# Patient Record
Sex: Male | Born: 1950 | Race: White | Hispanic: No | Marital: Married | Smoking: Former smoker
Health system: Southern US, Community
[De-identification: ages and names within clinical notes are randomized; demographics above are authoritative.]

## PROBLEM LIST (undated history)

## (undated) DIAGNOSIS — I509 Heart failure, unspecified: Secondary | ICD-10-CM

## (undated) DIAGNOSIS — I219 Acute myocardial infarction, unspecified: Secondary | ICD-10-CM

## (undated) DIAGNOSIS — I10 Essential (primary) hypertension: Secondary | ICD-10-CM

## (undated) DIAGNOSIS — E871 Hypo-osmolality and hyponatremia: Secondary | ICD-10-CM

## (undated) DIAGNOSIS — R569 Unspecified convulsions: Secondary | ICD-10-CM

## (undated) DIAGNOSIS — I639 Cerebral infarction, unspecified: Secondary | ICD-10-CM

## (undated) DIAGNOSIS — I2510 Atherosclerotic heart disease of native coronary artery without angina pectoris: Secondary | ICD-10-CM

## (undated) DIAGNOSIS — D649 Anemia, unspecified: Secondary | ICD-10-CM

## (undated) DIAGNOSIS — E785 Hyperlipidemia, unspecified: Secondary | ICD-10-CM

## (undated) HISTORY — PX: LAMINECTOMY: SHX219

## (undated) HISTORY — PX: TONSILLECTOMY: SUR1361

---

## 2012-10-08 ENCOUNTER — Inpatient Hospital Stay: Payer: Self-pay | Admitting: Internal Medicine

## 2012-10-08 LAB — TROPONIN I: Troponin-I: 10 ng/mL — ABNORMAL HIGH

## 2012-10-08 LAB — CBC
HGB: 14.9 g/dL (ref 13.0–18.0)
MCH: 30 pg (ref 26.0–34.0)
MCHC: 34.5 g/dL (ref 32.0–36.0)
MCV: 87 fL (ref 80–100)
Platelet: 368 10*3/uL (ref 150–440)
RBC: 4.96 10*6/uL (ref 4.40–5.90)
WBC: 8.7 10*3/uL (ref 3.8–10.6)

## 2012-10-08 LAB — APTT: Activated PTT: 60.8 secs — ABNORMAL HIGH (ref 23.6–35.9)

## 2012-10-08 LAB — BASIC METABOLIC PANEL
BUN: 12 mg/dL (ref 7–18)
Calcium, Total: 8.8 mg/dL (ref 8.5–10.1)
Chloride: 102 mmol/L (ref 98–107)
Co2: 27 mmol/L (ref 21–32)
Creatinine: 0.79 mg/dL (ref 0.60–1.30)
EGFR (African American): >60
EGFR (Non-African Amer.): >60
Potassium: 3.6 mmol/L (ref 3.5–5.1)

## 2012-10-08 LAB — CK TOTAL AND CKMB (NOT AT ARMC)
CK, Total: 214 U/L (ref 35–232)
CK-MB: 4.8 ng/mL — ABNORMAL HIGH (ref 0.5–3.6)

## 2012-10-08 LAB — PROTIME-INR
INR: 1
Prothrombin Time: 13.1 secs (ref 11.5–14.7)

## 2012-10-09 LAB — APTT: Activated PTT: 75.1 secs — ABNORMAL HIGH (ref 23.6–35.9)

## 2012-10-09 LAB — CK TOTAL AND CKMB (NOT AT ARMC): CK, Total: 189 U/L (ref 35–232)

## 2012-10-09 LAB — TROPONIN I: Troponin-I: 7.5 ng/mL — ABNORMAL HIGH

## 2014-02-11 ENCOUNTER — Inpatient Hospital Stay: Payer: Self-pay | Admitting: Internal Medicine

## 2014-02-11 LAB — CBC
HCT: 44.7 % (ref 40.0–52.0)
HGB: 14.8 g/dL (ref 13.0–18.0)
MCH: 29.1 pg (ref 26.0–34.0)
MCHC: 33 g/dL (ref 32.0–36.0)
MCV: 88 fL (ref 80–100)
Platelet: 352 10*3/uL (ref 150–440)
RBC: 5.07 10*6/uL (ref 4.40–5.90)
RDW: 14 % (ref 11.5–14.5)
WBC: 20.3 10*3/uL — ABNORMAL HIGH (ref 3.8–10.6)

## 2014-02-11 LAB — HEPATIC FUNCTION PANEL A (ARMC)
Albumin: 3.6 g/dL (ref 3.4–5.0)
Alkaline Phosphatase: 79 U/L
BILIRUBIN TOTAL: 0.4 mg/dL (ref 0.2–1.0)
Bilirubin, Direct: <0.1 mg/dL (ref 0.00–0.20)
SGOT(AST): 364 U/L — ABNORMAL HIGH (ref 15–37)
SGPT (ALT): 54 U/L (ref 12–78)
TOTAL PROTEIN: 7.9 g/dL (ref 6.4–8.2)

## 2014-02-11 LAB — BASIC METABOLIC PANEL
Anion Gap: 6 — ABNORMAL LOW (ref 7–16)
BUN: 13 mg/dL (ref 7–18)
CALCIUM: 9.1 mg/dL (ref 8.5–10.1)
Chloride: 102 mmol/L (ref 98–107)
Co2: 28 mmol/L (ref 21–32)
Creatinine: 0.82 mg/dL (ref 0.60–1.30)
EGFR (African American): >60
EGFR (Non-African Amer.): >60
Glucose: 109 mg/dL — ABNORMAL HIGH (ref 65–99)
Osmolality: 273 (ref 275–301)
Potassium: 3.6 mmol/L (ref 3.5–5.1)
SODIUM: 136 mmol/L (ref 136–145)

## 2014-02-11 LAB — CK TOTAL AND CKMB (NOT AT ARMC)
CK, TOTAL: 2421 U/L — AB
CK, Total: 2574 U/L — ABNORMAL HIGH
CK-MB: 317.2 ng/mL — ABNORMAL HIGH (ref 0.5–3.6)
CK-MB: 243.8 ng/mL — ABNORMAL HIGH (ref 0.5–3.6)

## 2014-02-11 LAB — TROPONIN I: Troponin-I: >40 ng/mL

## 2014-02-11 LAB — TSH: Thyroid Stimulating Horm: 1.22 u[IU]/mL

## 2014-02-11 LAB — CK: CK, Total: 2687 U/L — ABNORMAL HIGH

## 2014-02-11 LAB — APTT: ACTIVATED PTT: 55.3 s — AB (ref 23.6–35.9)

## 2014-02-11 LAB — LIPASE, BLOOD: Lipase: 72 U/L — ABNORMAL LOW (ref 73–393)

## 2014-02-11 LAB — HEMOGLOBIN A1C: HEMOGLOBIN A1C: 5.8 % (ref 4.2–6.3)

## 2014-02-12 LAB — COMPREHENSIVE METABOLIC PANEL
Albumin: 3 g/dL — ABNORMAL LOW (ref 3.4–5.0)
Alkaline Phosphatase: 65 U/L
Anion Gap: 7 (ref 7–16)
BUN: 11 mg/dL (ref 7–18)
Bilirubin,Total: 0.5 mg/dL (ref 0.2–1.0)
CO2: 26 mmol/L (ref 21–32)
CREATININE: 0.73 mg/dL (ref 0.60–1.30)
Calcium, Total: 8.4 mg/dL — ABNORMAL LOW (ref 8.5–10.1)
Chloride: 102 mmol/L (ref 98–107)
GLUCOSE: 128 mg/dL — AB (ref 65–99)
Osmolality: 271 (ref 275–301)
POTASSIUM: 3.5 mmol/L (ref 3.5–5.1)
SGOT(AST): 336 U/L — ABNORMAL HIGH (ref 15–37)
SGPT (ALT): 57 U/L (ref 12–78)
Sodium: 135 mmol/L — ABNORMAL LOW (ref 136–145)
Total Protein: 6.4 g/dL (ref 6.4–8.2)

## 2014-02-12 LAB — CBC WITH DIFFERENTIAL/PLATELET
Basophil #: 0.1 10*3/uL (ref 0.0–0.1)
Basophil %: 0.5 %
EOS PCT: 0.2 %
Eosinophil #: 0 10*3/uL (ref 0.0–0.7)
HCT: 38.7 % — AB (ref 40.0–52.0)
HGB: 13.1 g/dL (ref 13.0–18.0)
Lymphocyte #: 1.9 10*3/uL (ref 1.0–3.6)
Lymphocyte %: 11.6 %
MCH: 30 pg (ref 26.0–34.0)
MCHC: 33.7 g/dL (ref 32.0–36.0)
MCV: 89 fL (ref 80–100)
MONO ABS: 1.7 x10 3/mm — AB (ref 0.2–1.0)
Monocyte %: 10.2 %
NEUTROS ABS: 13 10*3/uL — AB (ref 1.4–6.5)
Neutrophil %: 77.5 %
PLATELETS: 291 10*3/uL (ref 150–440)
RBC: 4.35 10*6/uL — ABNORMAL LOW (ref 4.40–5.90)
RDW: 14.4 % (ref 11.5–14.5)
WBC: 16.7 10*3/uL — AB (ref 3.8–10.6)

## 2014-02-12 LAB — LIPID PANEL
CHOLESTEROL: 134 mg/dL (ref 0–200)
HDL Cholesterol: 36 mg/dL — ABNORMAL LOW (ref 40–60)
LDL CHOLESTEROL, CALC: 74 mg/dL (ref 0–100)
Triglycerides: 122 mg/dL (ref 0–200)
VLDL Cholesterol, Calc: 24 mg/dL (ref 5–40)

## 2014-02-12 LAB — APTT
ACTIVATED PTT: 64.9 s — AB (ref 23.6–35.9)
Activated PTT: 80.7 secs — ABNORMAL HIGH (ref 23.6–35.9)

## 2014-02-13 LAB — APTT: ACTIVATED PTT: 72.8 s — AB (ref 23.6–35.9)

## 2015-01-09 NOTE — Consult Note (Signed)
PATIENT NAME:  Corey Meyers, Corey Meyers MR#:  [id number] DATE OF BIRTH:  1950-10-21  DATE OF CONSULTATION:  10/08/2012  REFERRING PHYSICIAN:   Vivek J. Cherlynn KaiserSainani, MD  CONSULTING PHYSICIAN:  Marcina MillardAlexander Alicyn Klann, MD  CHIEF COMPLAINT:  Chest pain.   REASON FOR CONSULTATION: Consultation requested for evaluation of chest pain and elevated troponin.   HISTORY OF PRESENT ILLNESS: The patient is a 64 year old gentleman with history of hypertension. He was in his usual state of health until 10/06/2012. The patient is a Naval architecttruck driver and [place] he started to experience substernal burning, nausea and pain running down both arms. The patient pulled his truck over and tried to get comfortable in the chart. His symptoms lasted for approximately two hours before he fell asleep.  Today, the wife urged him to seek medical attention. The patient called Dr. Webb SilversmithHedrick's office, who suggested he go to [facility] Room. EKG was non-diagnostic. Initial troponin was 10.0. The patient currently is chest pain free.   PAST MEDICAL HISTORY:  1.  Hypertension.  2.  Tobacco abuse.   MEDICATIONS:  1.  Aspirin 81 mg daily.  2.  Lisinopril/hydrochlorothiazide 20/12.5 mg daily.   SOCIAL HISTORY: The patient is married. He smokes a pack of cigarettes a day. He works as a Naval architecttruck driver.   FAMILY HISTORY: No immediate family history for coronary disease or myocardial infarction.   REVIEW OF SYSTEMS: CONSTITUTIONAL: No fever or chills.  EYES: No blurry vision.  EARS: No hearing loss.  RESPIRATORY: No shortness of breath.  CARDIOVASCULAR: Chest discomfort, as described above.  GASTROINTESTINAL: The patient had nausea during chest discomfort.  GU: No dysuria or hematuria.  ENDOCRINE: No polyuria or polydipsia.  MUSCULOSKELETAL: No arthralgias or myalgias.  NEUROLOGICAL: No focal muscle weakness or numbness.  PSYCHOLOGICAL: No depression or anxiety.   PHYSICAL EXAMINATION:  VITAL SIGNS: Blood pressure 117/78,  pulse 71, respirations 20, temperature 98.3, pulse oximetry 96%.  HEENT: Pupils equal and reactive to light and accommodation.  NECK: Supple without thyromegaly.  LUNGS: Clear.  HEART: Normal JVP.  Normal PMI. Regular rate and rhythm. Normal S1, S2. No appreciable gallop, murmur, or rub.  ABDOMEN: Soft and nontender. Pulses were intact bilaterally.  MUSCULOSKELETAL: Normal muscle tone.  NEUROLOGIC: The patient is alert and oriented x 3. Motor and sensory both grossly intact.   IMPRESSION: 64 year old gentleman with multiple cardiovascular risk factors with recent episode of prolonged chest pain, with elevated troponin, consistent with non-ST elevation myocardial infarction.                RECOMMENDATIONS:  1.  Continue present medications.  2.  Agree with heparin drip.  3.  Proceed with cardiac catheterization with selective coronary arteriography on 10/09/2012.   The risks, benefits, and alternatives were explained and informed written consent obtained  ____________________________ Marcina MillardAlexander Deontrey Massi, MD ap:eg Meyers: 10/08/2012 20:35:36 ET T: 10/08/2012 22:18:09 ET JOB#: [id number]  cc: Marcina MillardAlexander Jayd Forrey, MD, <Dictator> Vivek J. Cherlynn KaiserSainani, MD  Marcina MillardALEXANDER Ellinor Test MD ELECTRONICALLY SIGNED 11/13/2012 14:56

## 2015-01-09 NOTE — Discharge Summary (Signed)
PATIENT NAME:  Corey Meyers, Corey Meyers MR#:  [id number] DATE OF BIRTH:  16-Nov-1950  DATE OF ADMISSION:  10/08/2012 DATE OF DISCHARGE:    CONSULTANT: Dr. Darrold JunkerParaschos.   PROCEDURES: Cardiac cath, final report pending, but the patient does have 90% stenosis of LAD and recommended [facility] for [facility] PCI.   IMAGING STUDIES: Include a chest x-ray which showed cardiomegaly.    ADMITTING HISTORY AND PHYSICAL: Please see detailed H and P dictated by Dr. Cherlynn KaiserSainani. In brief, a 64 year old male patient with hypertension and tobacco abuse, who presented to the hospital with burning in his chest with diaphoresis. The patient was found to have elevated troponin of 10, diagnosed with non-Q MI and admitted to the hospitalist service with cardiology consult.   HOSPITAL COURSE: 1.  Non-Q MI.  The patient had a cardiac cath which showed a complex proximal LAD stenosis and patient presently is chest pain-free, continued on the heparin drip and is being transferred to [facility] for complex PCI.  2.  Patient has been started on a beta blocker. His hydrochlorothiazide has been stopped. His lisinopril from home has been continued. The patient was counseled to quit smoking for greater than 3 minutes.  3.  Presently, the patient's pulse is 69, temperature 97.9, blood pressure 115/79 and saturating 96% on room air.   REASON FOR TRANSFER: Complex proximal LAD stenosis and transfer initiated by Dr. Darrold JunkerParaschos of cardiology. [facility].   PRESENT INPATIENT MEDICATIONS INCLUDE:  1.  Lisinopril 20 mg oral once a day.  2.  Aspirin 325 mg oral daily.  3.  Metoprolol tartrate 25 mg oral 2 times a day.  4.  Acetaminophen 650 mg oral q.4 p.r.n.  5.  Heparin drip.  6.  Pravachol 40 mg oral daily.   TIME SPENT: Time spent on discharge activity was 40 minutes.    ____________________________ Molinda BailiffSrikar R. Christophor Eick, MD srs:cs Meyers: 10/09/2012 15:05:58 ET T: 10/09/2012 15:17:17 ET JOB#: [id number]  cc: Wardell HeathSrikar  R. Geniyah Eischeid, MD, <Dictator> Marcina MillardAlexander Paraschos, MD [facility] Orie FishermanSRIKAR R Wilian Kwong MD ELECTRONICALLY SIGNED 10/25/2012 13:23

## 2015-01-09 NOTE — H&P (Signed)
PATIENT NAME:  Corey Meyers, Corey Meyers MR#:  [id number] DATE OF BIRTH:  1951-04-14  DATE OF ADMISSION:  10/08/2012  PRIMARY CARE PHYSICIAN: Dr. Burnett Sheng.   CHIEF COMPLAINT: Burning in his chest and nausea, vomiting.   HISTORY OF PRESENT ILLNESS: This is a 64 year old male who presents to the hospital brought in by his wife, as he had an episode of burning in his chest with some diaphoresis, nausea, vomiting that happened on Saturday. 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 Shortly after the headache, he developed bilateral shoulder blade pain and he attempted to pull over to the side of the road, which he did. Shortly thereafter, he broke out in a cold sweat and became quite nauseous and had some dry heaving and vomiting. He then developed this burning sensation in his chest that lasted for about an hour or 2. He tried lying in certain positions to see if it would alleviate his symptoms, but it did not and therefore eventually he fell off to sleep and woke up the next morning and has had no symptoms since then. He told his wife about what happened and the wife's father had similar symptoms like this and was noted to have a heart attack and therefore she was a bit worried and brought him to the ER. In the Emergency Room, the patient was noted to have an elevated troponin at 10 and hospitalist services were contacted for further treatment and evaluation.   REVIEW OF SYSTEMS: CONSTITUTIONAL: No documented fever. No weight gain or weight loss.  EYES: No blurred or double vision.  ENT: No tinnitus. No postnasal drip. No redness of oropharynx.  RESPIRATORY: No cough, no wheeze, no hemoptysis.  CARDIOVASCULAR: Positive chest pain. No orthopnea, no palpitations, no syncope.  GASTROINTESTINAL: Positive nausea. Positive vomiting. No diarrhea. No abdominal pain, no melena, hematochezia.  GENITOURINARY: No dysuria or hematuria.   ENDOCRINE: No polyuria or nocturia. No heat or cold intolerance.  HEMATOLOGIC: No anemia, no bruising, no bleeding.  INTEGUMENTARY: No rashes. No lesions.  MUSCULOSKELETAL: No arthritis, no swelling, and no gout.   NEUROLOGIC: No numbness, no tingling, no ataxia, no seizure-type activity.  PSYCHIATRIC: No anxiety, no insomnia, no ADD.   PAST MEDICAL HISTORY: Is consistent with hypertension, tobacco abuse.   ALLERGIES: No known drug allergies.   SOCIAL HISTORY: Does smoke about a pack per day, has been smoking for the past 20 to 30 years. No alcohol abuse. No illicit drug abuse. Lives at home with his wife.   FAMILY HISTORY: Both mother and father are deceased. Mother died from breast cancer. Father died from complications of renal failure.   CURRENT MEDICATIONS: Aspirin 81 mg daily and hydrochlorothiazide/lisinopril 12.5/20, 1 tab daily.   PHYSICAL EXAMINATION:  On admission is as follows:   VITAL SIGNS: Temperature 98, pulse 78, respirations 20, blood pressure 110/68, sats 99% on room air.  GENERAL: He is a pleasant appearing male in no apparent distress.  HEENT: He is atraumatic, normocephalic. Extraocular muscles are intact. Pupils equal, reactive to light. Sclerae anicteric. No conjunctival injection. No pharyngeal erythema.  NECK: Supple. No jugular venous distention. No bruits, no lymphadenopathy, no thyromegaly.  HEART: Regular rate and rhythm. No murmurs, no rubs, no clicks.  LUNGS: Clear to auscultation bilaterally. No rales, no rhonchi, no wheezes.  ABDOMEN: Soft, flat, nontender, nondistended. He has good bowel sounds. No hepatosplenomegaly appreciated.  EXTREMITIES: There is no evidence of any cyanosis,  clubbing, or peripheral edema. He has +2 pedal and radial pulses bilaterally.  NEUROLOGICAL: The patient is alert, awake, and oriented x 3 with no focal motor or sensory deficits bilaterally.  SKIN: Moist and warm with no rash appreciated.  LYMPHATIC: There is no cervical or  axillary lymphadenopathy.   LABORATORY DATA:  Serum glucose 85, BUN 12, creatinine 0.7, sodium 137, potassium 3.6, chloride 102, bicarbonate 27. Total CK 248, CK-MB 9.3. Troponin 10. White cell count 8.7, hemoglobin 14.9, hematocrit 43.3, platelet count 368. INR is 1.0.   The patient did have an EKG done, which shows normal sinus rhythm with normal axes with T wave inversion is noted in lead I, AVL and V2.   ASSESSMENT AND PLAN: This is a 64 year old male with past medical history of hypertension and tobacco abuse, who presents to the hospital due to an episode of chest burning with diaphoresis, nausea, vomiting on Saturday, and now noted to have an elevated troponin of 10.   PROBLEMS: 1.  Non- Q wave MI. This was likely the cause of the patient's symptoms on Saturday. The patient currently is asymptomatic. He likely suffered his MI this past Saturday when he had these acute symptoms. His EKG does show T wave inversions in the lateral lead. For now, I will continue him on a heparin drip, give him aspirin, sublingual nitroglycerin as needed for chest pain, but he is chest pain-free. Continue some IV morphine for chest pain. Continue some beta blocker and statin. Also will get a cardiology consult, as he likely needs a cardiac catheterization. I discussed the case with Dr. Darrold JunkerParaschos, who will see the patient.  2.  Hypertension. We will continue his metoprolol, also continue hydrochlorothiazide and lisinopril, follow hemodynamics. 3.  Tobacco abuse. I counseled the patient on quitting smoking anywhere between 1 to 3 minutes. I offered nicotine patch, but he refused.   CODE STATUS: The patient is a full code.   TIME SPENT: 50 minutes    ____________________________ Rolly PancakeVivek J. Cherlynn KaiserSainani, MD vjs:cc D: 10/08/2012 17:54:57 ET T: 10/08/2012 19:02:43 ET JOB#: [id number]  cc: Rolly PancakeVivek J. Cherlynn KaiserSainani, MD, <Dictator> Houston SirenVIVEK J SAINANI MD ELECTRONICALLY SIGNED 10/09/2012 16:27

## 2015-01-10 NOTE — Consult Note (Signed)
PATIENT NAME:  Corey Meyers, Corey Meyers MR#:  [id number] DATE OF BIRTH:  11/24/1950  DATE OF CONSULTATION:  02/11/2014  REFERRING PHYSICIAN:  Dr. Winona Legato CONSULTING PHYSICIAN:  Kannon Baum D. Laron Boorman, MD  INDICATION:  Unstable angina, non-Q-wave myocardial infarction.   CARDIOLOGIST:  Dr. Darrold Junker.   HISTORY OF PRESENT ILLNESS:  The patient is a 65 year old white male with a history of non-Q anterior wall myocardial infarction in January of 2014.  The patient also had hypertension, hyperlipidemia, tobacco abuse, came to the hospital complaints of chest pain over the last 24 to 48 hours.  The patient has been noncompliant with his medications according to the wife.  He quit taking Plavix, quit taking Lipitor.  He should be on blood pressure medicines lisinopril, HCTZ.  At around 2:30 yesterday the patient was washing the car in the driveway and started having a burning sensation in the chest, 10 out of 10 in intensity, nausea and vomiting, not much shortness of breath or lightheadedness.  Pain was recurrent.  Took Alka-Seltzer with no relief.  Eventually he finally came to the Emergency Room for evaluation.  Initial EKG was concerning for a NSTEMI, but did not meet criteria.  He was placed on anticoagulation and subsequently had troponins that were elevated.   REVIEW OF SYSTEMS:   No blackout spells, no syncope.  He has had mild nausea, vomiting.  No fever.  No chills.  No sweats.  No weight loss.  No weight gain, no hemoptysis, no hematemesis.  No bright red blood per rectum.  No vision change or hearing change.  Denies sputum production or cough.   PAST MEDICAL HISTORY:  Hyperlipidemia, hypertension, tobacco abuse, non-Q-wave myocardial infarction, obesity, coronary artery disease.   PAST SURGICAL HISTORY:  PCI and stent to the proximal LAD done at [facility].  Repeat catheterization, patent stents.   ALLERGIES:  None.   SOCIAL HISTORY:  He smokes.  Works as a Naval architect.  No alcohol consumption.  Lives with his  wife.   FAMILY HISTORY:  Breast cancer, renal insufficiency, coronary artery disease.   MEDICATIONS:  Aspirin 81 mg daily, HCTZ, lisinopril 12.5/20 once a day, he should be on Lipitor 80 a day, Plavix 75, but is not sure he is taking any of these medications for at least two weeks.   PHYSICAL EXAMINATION: VITAL SIGNS:  Blood pressure 110/70, pulse of 75, respiratory rate of 16, afebrile.  HEENT:  Normocephalic, atraumatic.  Pupils equal, reactive to light.  NECK:  Supple.  No significant JVD, bruits or adenopathy.  LUNGS:  Clear to auscultation and percussion.  No significant wheeze, rhonchi, or rale.  HEART:  Regular rate and rhythm.  Systolic ejection murmur left sternal border.  Positive S4.  PMI nondisplaced.  ABDOMEN:  Benign.  EXTREMITIES:  Within normal limits.  NEUROLOGIC:  Intact.  SKIN:  Normal.   EKG:  Normal sinus rhythm, Q waves, anteroseptally, nonspecific ST-T changes otherwise.  Glucose 109.  BNP was negative.  Lipase 72, hemoglobin A1c 5.8.  Liver enzymes were elevated at 364, AST was 364.  Troponin was 40.  White count of 20, hemoglobin of 14.8, platelet count was normal.  PTT 23.  Chest x-ray unremarkable.   ASSESSMENT: 1.  Acute non-Q-wave myocardial infarction, abnormal EKG.  2.  Hypertension.  3.  Coronary artery disease.  4.  Hyperlipidemia.  5.  Hyperglycemia.  6.  Obesity.   PLAN:   1.  Agree with admit.  Rule out for myocardial infarction.  Continue current therapy, heparin,  we will add IIb 3 inhibitor, continue current medical therapy, aspirin therapy.  Resume Plavix.  Resume anticoagulation.  We will consider echocardiogram.  2.  For hyperlipidemia, resume Lipitor therapy.  Follow up lipid and liver studies.  3.  For obesity, recommend weight loss, exercise, portion control.  4.  For coronary artery disease, aspirin and Plavix, beta blocker, ACE inhibitor therapy.  5.  Elevated white count, may be an acute phase reactant.  6.  Hyperglycemia, follow up  hemoglobin A1c, which was 5.8 which is good.  We will probably proceed with cardiac catheterization in the morning.  Place on to IIb 3 inhibitor as well as heparin in the interim.  Proceed with catheterization in the morning.    ____________________________ Bobbie Stackwayne D. Juliann Paresallwood, MD ddc:ea D: 02/11/2014 16:46:36 ET T: 02/11/2014 17:17:24 ET JOB#: [id number]  cc: Gennette Shadix D. Juliann Paresallwood, MD, <Dictator> Alwyn PeaWAYNE D Axzel Rockhill MD ELECTRONICALLY SIGNED 03/26/2014 16:42

## 2015-01-10 NOTE — Discharge Summary (Signed)
PATIENT NAME:  Corey Meyers, Corey D MR#:  [id number] DATE OF BIRTH:  [birth year]  DATE OF ADMISSION:  02/11/2014 DATE OF DISCHARGE:  02/12/2014   DISCHARGE DIAGNOSES: 1.  Acute myocardial infarction. History of coronary artery disease and stent placement in the past. Noncompliance to medication. Has clotted previous stent.  2.  Hypertension.  3.  Hyperlipidemia.  4.  Hyperglycemia.  5.  Smoking.  CONDITION ON DISCHARGE: Stable.   CODE STATUS: Full code.   DISCHARGE MEDICATIONS: 1.  Atorvastatin 80 mg oral tablet once a day.  2.  Morphine 2 mg injection every 4 hours as needed.  3.  Aspirin 325 mg oral once a day.  4.  Clopidogrel 75 mg oral once a day.  5.  Metoprolol 25 mg oral 2 times a day.  6.  Pantoprazole 40 mg delayed-release tablet once a day.  7.  Nicotine 1 patch transdermal once a day.   DIET ON DISCHARGE: Low-sodium, low-fat, low-cholesterol, regular consistency diet.   DISPOSITION: Advised to be discharged [facility] for further management of his coronary artery disease. Transfer with IV heparin drip running.   HISTORY OF PRESENT ILLNESS: This is a 64 year old male with history significant for non-Q-wave myocardial infarction in January 2014 with hypertension, hyperlipidemia, and tobacco abuse. Had stent placement for that MI, and he was on Plavix, aspirin, hydrochlorothiazide and lisinopril because of insurance issues a few weeks ago, the patient stopped taking his medications. Then, on the previous day when he was working in his driveway started having epigastric pain which was radiating to right side of abdomen, and some discomfort was also going to his lower sternum. Pain was 10 out of 10, intense. He was also feeling some lightheadedness, some nausea, severe headache, but no palpitations. Pain radiated to his right shoulder and right neck and back. Finally he decided to come to Emergency Room, as aspirin did not relieve his pain. In ER, his troponin was found to be 40, and he had ST  elevation in anterior leads so hospitalist service was contacted for further management.   HOSPITAL COURSE AND STAY:  1.  He was started on IV heparin drip and cardiology consult was called in. Cardiologist, Dr. Juliann Paresallwood, saw him in Emergency Room and admitted to Critical Care Unit, where he was managed with heparin drip and tirofiban IV drip. The next day, which is on 27th of May, he was taken for cardiac catheterization and was found with LAD 100% in-stent 100% block, so because of this complicated lesion, Dr. Juliann Paresallwood suggested to [facility], and the patient is being managed and monitored in Critical Care Unit with IV heparin drip after the procedure. We gave him aspirin and Plavix in the hospital and gave him metoprolol and atorvastatin.  2.  For hypertension, gave metoprolol.  3.  Hyperlipidemia: Gave atorvastatin.  4.  Smoking: We gave nicotine patch and smoking cessation counseling was done.  IMPORTANT LABORATORY AND RADIOLOGICAL RESULTS: Creatinine 0.82. LDL 74, cholesterol 134, triglyceride 122, and HDL 36. Troponin was more than 40 all 3 times we followed. TSH 1.22. WBC 20,000, hemoglobin 14.8, platelet count 352. Chest x-ray, portable single view: No active cardiopulmonary disease.   TOTAL TIME SPENT ON THIS DISCHARGE: 40 minutes.   ____________________________ Hope PigeonVaibhavkumar G. Elisabeth PigeonVachhani, MD vgv:jcm D: 02/12/2014 16:25:42 ET T: 02/12/2014 17:22:51 ET JOB#: [id number]  cc: Hope PigeonVaibhavkumar G. Elisabeth PigeonVachhani, MD, <Dictator> Dwayne D. Juliann Paresallwood, MD Altamese DillingVAIBHAVKUMAR Scotty Pinder MD ELECTRONICALLY SIGNED 03/05/2014 9:00

## 2015-01-10 NOTE — Consult Note (Signed)
Chief Complaint:  Subjective/Chief Complaint Pt states to be doing well. No cp today no sob Groin ok   VITAL SIGNS/ANCILLARY NOTES: **Vital Signs.:   28-May-15 13:00  Vital Signs Type Routine  Pulse Pulse 80  Pulse source if not from Vital Sign Device per cardiac monitor  Respirations Respirations 22  Systolic BP Systolic BP 938  Diastolic BP (mmHg) Diastolic BP (mmHg) 60  Mean BP 73  BP Source  if not from Vital Sign Device non-invasive  Pulse Ox % Pulse Ox % 95  Pulse Ox Activity Level  At rest  Oxygen Delivery 4L  Pulse Ox Heart Rate 80  *Intake and Output.:   Shift 28-May-15 15:00  Grand Totals Intake:  1693.6 Output:  1975    Net:  -281.4 24 Hr.:  -281.4  Oral Intake      In:  600  Heparin      In:  293.6  IV (Primary)      In:  800  Urine ml     Out:  1975  Length of Stay Totals Intake:  8334.3 Output:  4360    Net:  3974.3   Brief Assessment:  GEN well developed, well nourished, no acute distress   Cardiac Regular  murmur present  -- LE edema  --Rub   Respiratory normal resp effort   Gastrointestinal Normal   Gastrointestinal details normal Soft   Lab Results: Hepatic:  27-May-15 02:10   Bilirubin, Total 0.5  Alkaline Phosphatase 65 (45-117 NOTE: New Reference Range 08/09/13)  SGPT (ALT) 57  SGOT (AST)  336  Total Protein, Serum 6.4  Albumin, Serum  3.0  Routine Chem:  27-May-15 02:10   Cholesterol, Serum 134  Triglycerides, Serum 122  HDL (INHOUSE)  36  VLDL Cholesterol Calculated 24  LDL Cholesterol Calculated 74 (Result(s) reported on 12 Feb 2014 at 03:18AM.)  Glucose, Serum  128  BUN 11  Creatinine (comp) 0.73  Sodium, Serum  135  Potassium, Serum 3.5  Chloride, Serum 102  CO2, Serum 26  Calcium (Total), Serum  8.4  Osmolality (calc) 271  eGFR (African American) >60  eGFR (Non-African American) >60 (eGFR values <29m/min/1.73 m2 may be an indication of chronic kidney disease (CKD). Calculated eGFR is useful in patients with stable  renal function. The eGFR calculation will not be reliable in acutely ill patients when serum creatinine is changing rapidly. It is not useful in  patients on dialysis. The eGFR calculation may not be applicable to patients at the low and high extremes of body sizes, pregnant women, and vegetarians.)  Anion Gap 7  Routine Coag:  27-May-15 02:10   Activated PTT (APTT)  64.9 (A HCT value >55% may artifactually increase the APTT. In one study, the increase was an average of 19%. Reference: "Effect on Routine and Special Coagulation Testing Values of Citrate Anticoagulant Adjustment in Patients with High HCT Values." American Journal of Clinical Pathology 2006;126:400-405.)    08:40   Activated PTT (APTT)  80.7 (A HCT value >55% may artifactually increase the APTT. In one study, the increase was an average of 19%. Reference: "Effect on Routine and Special Coagulation Testing Values of Citrate Anticoagulant Adjustment in Patients with High HCT Values." American Journal of Clinical Pathology 21829;937:169-678)  28-May-15 06:29   Activated PTT (APTT)  72.8 (A HCT value >55% may artifactually increase the APTT. In one study, the increase was an average of 19%. Reference: "Effect on Routine and Special Coagulation Testing Values of Citrate Anticoagulant Adjustment in Patients with  High HCT Values." American Journal of Clinical Pathology 1027;253:664-403.)  Routine Hem:  27-May-15 02:10   WBC (CBC)  16.7  RBC (CBC)  4.35  Hemoglobin (CBC) 13.1  Hematocrit (CBC)  38.7  Platelet Count (CBC) 291  MCV 89  MCH 30.0  MCHC 33.7  RDW 14.4  Neutrophil % 77.5  Lymphocyte % 11.6  Monocyte % 10.2  Eosinophil % 0.2  Basophil % 0.5  Neutrophil #  13.0  Lymphocyte # 1.9  Monocyte #  1.7  Eosinophil # 0.0  Basophil # 0.1 (Result(s) reported on 12 Feb 2014 at 02:48AM.)   Radiology Results: XRay:    26-May-15 11:28, Chest Portable Single View  Chest Portable Single View   REASON FOR EXAM:     chest pain  COMMENTS:       PROCEDURE: DXR - DXR PORTABLE CHEST SINGLE VIEW  - Feb 11 2014 11:28AM     CLINICAL DATA:  Chest pain that    EXAM:  PORTABLE CHEST - 1 VIEW    COMPARISON:  Prior chest x-ray 10/08/2012    FINDINGS:  Thelungs are clear and negative for focal airspace consolidation,  pulmonary edema or suspicious pulmonary nodule. No pleural effusion  or pneumothorax. Cardiac and mediastinal contours are likely within  normal limits given portable frontal technique. No acute fracture or  lytic or blastic osseous lesions. The visualized upper abdominal  bowel gas pattern is unremarkable.     IMPRESSION:  No active cardiopulmonary disease.      Electronically Signed    By: Jacqulynn Cadet M.D.    On: 02/11/2014 11:44         Verified By: Criselda Peaches, M.D.,  Cardiology:    26-May-15 10:50, ED ECG  Ventricular Rate 76  Atrial Rate 76  P-R Interval 130  QRS Duration 78  QT 400  QTc 450  P Axis -9  R Axis 93  T Axis 31  ECG interpretation   Normal sinus rhythm with sinus arrhythmia  Rightward axis  Low voltage QRS  Anteroseptal infarct (cited on or before 08-Oct-2012)  Statement Not Found (#821)  Abnormal ECG  When compared with ECG of 08-Oct-2012 14:41,  Questionable change in initial forces of Anteroseptal leads  ST no longer elevated in Inferior leads  T wave inversion no longer evident in Lateral leads  ----------unconfirmed----------  Confirmed by OVERREAD, NOT ([id number]), editor PEARSON, BARBARA ([id number]) on 02/12/2014 1:33:11 PM  ED ECG    Assessment/Plan:  Assessment/Plan:  Assessment IMP S/P MI Ant  Acute stent thrombusis HTN CAD Hyperlipidemia Obesity No compliance .   Plan PLAN IV hydration ACE therapy Low dose B-blockers ASA Heparin for anticoug Rec viabilitity study Rec transfer to [facility] for complex PCI   Electronic Signatures: Yolonda Kida (MD)  (Signed 28-May-15 17:19)  Authored: Chief Complaint, VITAL  SIGNS/ANCILLARY NOTES, Brief Assessment, Lab Results, Radiology Results, Assessment/Plan   Last Updated: 28-May-15 17:19 by Yolonda Kida (MD)

## 2015-01-10 NOTE — H&P (Signed)
PATIENT NAME:  Corey Meyers, Corey Meyers MR#:  [id number] DATE OF BIRTH:  10-21-1950  DATE OF ADMISSION:  02/11/2014  PRIMARY CARE PHYSICIAN: Rhona Leavens. 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 According to the patient, he was doing well, and he has been taking Plavix as well as Lipitor over the past one year since January 2014. However, over the past few weeks, he completely stopped all medications including aspirin, HCTZ/lisinopril, statin (which is Lipitor), as well as Plavix due to insurance issues. Yesterday at around 2:30 p.m., he was washing in the driveway and he started having epigastric to right side abdominal discomfort, which was going on over the top of his lower sternum. Pain was described as burning sensation, constant. It was severe, 10/10 by intensity, accompanied by nausea and vomiting but no significant shortness of breath, lightheadedness or dizziness, or diaphoresis. It was accompanied also by severe headache but no palpitations. The patient's pain radiated to his right shoulder, as well as right neck and back. The patient tried numerous medications including aspirin. Also he took some Alka-Seltzer a few times over the past 10 hours. However, his pain did not abate and he decided to come to Emergency Room for further evaluation. In the Emergency Room, his EKG revealed ST elevation in anterior leads, and troponin was as high as 40. Hospitalist services were contacted for admission.   PAST MEDICAL HISTORY: Significant for history of hyperlipidemia, hypertension, tobacco abuse ongoing, history of non-Q-wave MI, status post catheterization which showed a proximal LAD lesion (approximately 90%), after which the patient was transferred to [facility]  where he had 2 stents placed in proximal LAD (area close to bifurcation). He has been taking Plavix, and he completed one year of Plavix therapy. He recently was on aspirin 81 mg 2 tablets once daily, HCTZ/lisinopril 12.5/20 mg once daily, and statin. He was supposed to be on Lipitor 80 mg daily as well as Plavix. However, he is not taking Lipitor or Plavix, and over the past 2 weeks, he actually stopped all medications because of insurance issues.   PAST SURGICAL HISTORY: The patient did have cardiac catheterization in January 2014 a few times. The first time he had it at [facility] where a proximal LAD lesion of 90% was diagnosed by Dr. Darrold Junker. The patient was noted to be [facility] where he had repeat cardiac catheterization and 2 stents placed in LAD. No other surgeries.  ALLERGIES: No known drug allergies.   SOCIAL HISTORY: Smokes. Used to smoke 1 pack a day, has been smoking for the past 20 or 30 years. The patient now transitioned to small cigars, and he smokes approximately 20 cigars in one week. He tries to quit. No alcohol abuse. No illicit drugs. Lives at home with his wife.   FAMILY HISTORY: The patient's both mother and father are deceased. Mother died of breast carcinoma. The patient's father died of complications of renal failure.   MEDICATIONS: Apparently the patient was supposed to be on aspirin 81 mg 2 tablets once daily and HCTZ/lisinopril 12.5/20 once daily. He was supposed to be on statin 80 mg p.o. daily (Lipitor) and Plavix 75 mg p.o. daily. He has not taking those medications over the past 2 weeks.  REVIEW OF SYSTEMS:  CONSTITUTIONAL: Positive for chest pains. However, denies any fevers, chills, fatigue, weakness, pains except as mentioned above. No weight loss or gain.  EYES: Denies any blurry vision, double vision, glaucoma or cataracts.  EARS, NOSE, THROAT: Denies any tinnitus, allergies, epistaxis, sinus pain, dentures,  difficulty swallowing. RESPIRATORY: Denies any cough, wheezes, asthma, COPD. CARDIOVASCULAR: Denies any orthopnea, arrhythmias, palpitations or syncope. Does have chest pains. GASTROINTESTINAL: Admits of nausea, vomiting. No diarrhea, constipation, rectal bleeding, change in bowel habits.  GENITOURINARY: Denies dysuria, hematuria, frequency or incontinence. ENDOCRINE: Denies any polydipsia, nocturia, thyroid problems, heat or cold intolerance or thirst. HEMATOLOGIC: No anemia, bruising, bleeding, swollen glands. SKIN: Denies any acne, rashes, lesions or change in moles.  MUSCULOSKELETAL: Denies arthritis, cramps, swelling, gout.  NEUROLOGICAL: No numbness, epilepsy or tremors. PSYCHIATRIC: Denies anxiety, insomnia or depression.   PHYSICAL EXAMINATION:  VITAL SIGNS: On arrival to the hospital, the patient's temperature was 98. Pulse was 76. Respiratory rate was 20, blood pressure 106/70. Saturation was 96% on room air. GENERAL: This is a well-developed, well-nourished, obese Caucasian male in no significant distress, comfortable on the stretcher. He is somewhat flushed in his face, lying on the stretcher. HEENT: His pupils are equal, reactive to light. Extraocular movements intact. No icterus or conjunctivitis. Has normal hearing. No pharyngeal erythema. Mucosa is moist.  NECK: No masses. Supple, nontender. Thyroid is not enlarged. No adenopathy. No JVD. No carotid bruits bilaterally. Full range of motion.  LUNGS: Clear to auscultation in all fields, somewhat diminished breath sounds, but otherwise no rales, rhonchi or wheezing. No labored inspirations, increased effort, dullness to percussion, overt respiratory distress.  CARDIOVASCULAR: S1, S2 appreciated. Rhythm was regular. PMI not lateralized. Chest was nontender to palpation.  EXTREMITIES: 1+ pedal pulses. No lower extremity edema, calf tenderness or cyanosis was noted.  ABDOMEN: Soft, nontender. Bowel sounds were present. No  hepatosplenomegaly or masses were noted.  RECTAL: Deferred.  MUSCULOSKELETAL: Muscle strength: Able to move all extremities. No cyanosis, degenerative joint disease or kyphosis. Gait was not tested.  SKIN: Did not reveal any rashes, lesions, erythema, nodularity or induration. It was warm and dry to palpation.  LYMPH: No adenopathy in the cervical region.  NEUROLOGICAL: Cranial nerves grossly intact. Sensory is intact. No dysarthria or aphasia. The patient is alert, oriented to time, person and place, cooperative. Memory is good. PSYCHIATRIC: No significant confusion, agitation or depression was noted.   EKG showed normal sinus rhythm with sinus arrhythmia at 76 beats per minute, rightward axis, low voltage QRS; ST elevations in V2, V3 with Q waves in V2, V3 and some ST depressions in lead III.  LABORATORY DATA:  BMP showed glucose of 109; otherwise, BMP was unremarkable. Lipase level was normal at 72. Hemoglobin A1c was 5.8. Liver enzymes revealed AST elevation of 364; otherwise, liver enzymes were unremarkable. Troponin level was more than 40. White blood cell count was 20.3, hemoglobin 14.8, platelet count 352. Activated PTT less than 23.0.   RADIOLOGIC STUDIES: Chest x-ray portable single view, 26th of May 2015, revealed no active cardiopulmonary disease.   ASSESSMENT AND PLAN: 1.  Acute anterior wall myocardial infarction: Admit patient to medical floor. Start him on aspirin, Plavix, heparin IV, as well as nitroglycerin topically. Get a stat cardiac catheterization by Dr. Juliann Pares tomorrow.  2.  Hypertension: Will continue the patient on metoprolol as well as nitroglycerin.  3.  Hyperglycemia: Hemoglobin A1c was unremarkable. The patient does not have diabetes. 4.  Hyperlipidemia: Will resume the patient on Lipitor. Will check lipid panel  in the morning. Apparently had difficulty with insurance, and he has not been taking his medications recently.  5.  Tobacco abuse: Nicotine replacement  therapy will be initiated. That was discussed with the patient for approximately 3 minutes.   TIME SPENT: 50 minutes.    ____________________________ Katharina Caperima Izrael Peak, MD rv:jcm D: 02/11/2014 13:51:32 ET T: 02/11/2014 14:27:04 ET JOB#: [id number]  cc: Katharina Caperima Talaysia Pinheiro, MD, <Dictator> Rhona LeavensJames F. Burnett ShengHedrick, MD Katharina CaperIMA Franziska Podgurski MD ELECTRONICALLY SIGNED 03/15/2014 16:57

## 2017-08-05 ENCOUNTER — Emergency Department: Payer: Worker's Compensation

## 2017-08-05 ENCOUNTER — Emergency Department
Admission: EM | Admit: 2017-08-05 | Discharge: 2017-08-05 | Disposition: A | Discharge status: Home / Self Care | Payer: Worker's Compensation | Attending: Emergency Medicine | Admitting: Emergency Medicine

## 2017-08-05 ENCOUNTER — Encounter: Payer: Self-pay | Admitting: Emergency Medicine

## 2017-08-05 ENCOUNTER — Other Ambulatory Visit: Payer: Self-pay

## 2017-08-05 DIAGNOSIS — I10 Essential (primary) hypertension: Secondary | ICD-10-CM | POA: Diagnosis not present

## 2017-08-05 DIAGNOSIS — Z8673 Personal history of transient ischemic attack (TIA), and cerebral infarction without residual deficits: Secondary | ICD-10-CM | POA: Diagnosis not present

## 2017-08-05 DIAGNOSIS — Z87891 Personal history of nicotine dependence: Secondary | ICD-10-CM | POA: Insufficient documentation

## 2017-08-05 DIAGNOSIS — M7581 Other shoulder lesions, right shoulder: Secondary | ICD-10-CM

## 2017-08-05 DIAGNOSIS — M5412 Radiculopathy, cervical region: Secondary | ICD-10-CM

## 2017-08-05 DIAGNOSIS — M542 Cervicalgia: Secondary | ICD-10-CM

## 2017-08-05 DIAGNOSIS — M5010 Cervical disc disorder with radiculopathy, unspecified cervical region: Secondary | ICD-10-CM | POA: Insufficient documentation

## 2017-08-05 DIAGNOSIS — M25511 Pain in right shoulder: Secondary | ICD-10-CM | POA: Diagnosis present

## 2017-08-05 DIAGNOSIS — M75101 Unspecified rotator cuff tear or rupture of right shoulder, not specified as traumatic: Secondary | ICD-10-CM | POA: Diagnosis not present

## 2017-08-05 DIAGNOSIS — M5030 Other cervical disc degeneration, unspecified cervical region: Secondary | ICD-10-CM

## 2017-08-05 DIAGNOSIS — M79601 Pain in right arm: Secondary | ICD-10-CM

## 2017-08-05 HISTORY — DX: Cerebral infarction, unspecified: I63.9

## 2017-08-05 HISTORY — DX: Hyperlipidemia, unspecified: E78.5

## 2017-08-05 HISTORY — DX: Essential (primary) hypertension: I10

## 2017-08-05 MED ORDER — PREDNISONE 10 MG PO TABS: 10.0000 mg | ORAL_TABLET | Freq: Every day | ORAL | 0 refills | Status: DC

## 2017-08-05 NOTE — Discharge Instructions (Signed)
Please take prednisone as prescribed.  Avoid any other anti-inflammatory medications while on the prednisone.  Please follow-up with orthopedics to discuss further treatment.  Return to the emergency department for any increasing pain worsening symptoms or to changes in your health.
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
## 2017-08-18 ENCOUNTER — Other Ambulatory Visit: Payer: Self-pay | Admitting: Orthopedic Surgery

## 2017-08-18 DIAGNOSIS — M25511 Pain in right shoulder: Secondary | ICD-10-CM

## 2017-08-24 ENCOUNTER — Ambulatory Visit
Admission: RE | Admit: 2017-08-24 | Discharge: 2017-08-24 | Disposition: A | Discharge status: Home / Self Care | Payer: Worker's Compensation | Source: Ambulatory Visit | Attending: Orthopedic Surgery | Admitting: Orthopedic Surgery

## 2017-08-24 ENCOUNTER — Other Ambulatory Visit: Payer: Self-pay | Admitting: Orthopedic Surgery

## 2017-08-24 DIAGNOSIS — M25511 Pain in right shoulder: Secondary | ICD-10-CM | POA: Diagnosis present

## 2017-08-24 DIAGNOSIS — S46011A Strain of muscle(s) and tendon(s) of the rotator cuff of right shoulder, initial encounter: Secondary | ICD-10-CM | POA: Insufficient documentation

## 2017-08-24 DIAGNOSIS — M19011 Primary osteoarthritis, right shoulder: Secondary | ICD-10-CM | POA: Diagnosis not present

## 2018-12-21 ENCOUNTER — Encounter (HOSPITAL_COMMUNITY)
Admission: EM | Disposition: A | Discharge status: Skilled Nursing Facility | Payer: Self-pay | Source: Home / Self Care | Attending: Neurology

## 2018-12-21 ENCOUNTER — Other Ambulatory Visit: Payer: Self-pay

## 2018-12-21 ENCOUNTER — Inpatient Hospital Stay (HOSPITAL_COMMUNITY): Payer: Medicare HMO

## 2018-12-21 ENCOUNTER — Emergency Department (HOSPITAL_COMMUNITY): Payer: Medicare HMO

## 2018-12-21 ENCOUNTER — Inpatient Hospital Stay (HOSPITAL_COMMUNITY)
Admission: EM | Admit: 2018-12-21 | Discharge: 2018-12-27 | DRG: 023 | Disposition: A | Discharge status: Skilled Nursing Facility | Payer: Medicare HMO | Attending: Neurology | Admitting: Neurology

## 2018-12-21 ENCOUNTER — Emergency Department (HOSPITAL_COMMUNITY): Payer: Medicare HMO | Admitting: Anesthesiology

## 2018-12-21 DIAGNOSIS — I5022 Chronic systolic (congestive) heart failure: Secondary | ICD-10-CM | POA: Diagnosis present

## 2018-12-21 DIAGNOSIS — D72829 Elevated white blood cell count, unspecified: Secondary | ICD-10-CM | POA: Diagnosis not present

## 2018-12-21 DIAGNOSIS — Z8673 Personal history of transient ischemic attack (TIA), and cerebral infarction without residual deficits: Secondary | ICD-10-CM

## 2018-12-21 DIAGNOSIS — E785 Hyperlipidemia, unspecified: Secondary | ICD-10-CM | POA: Diagnosis present

## 2018-12-21 DIAGNOSIS — I255 Ischemic cardiomyopathy: Secondary | ICD-10-CM | POA: Diagnosis present

## 2018-12-21 DIAGNOSIS — Z978 Presence of other specified devices: Secondary | ICD-10-CM

## 2018-12-21 DIAGNOSIS — I252 Old myocardial infarction: Secondary | ICD-10-CM

## 2018-12-21 DIAGNOSIS — J9600 Acute respiratory failure, unspecified whether with hypoxia or hypercapnia: Secondary | ICD-10-CM

## 2018-12-21 DIAGNOSIS — I639 Cerebral infarction, unspecified: Secondary | ICD-10-CM | POA: Diagnosis not present

## 2018-12-21 DIAGNOSIS — I10 Essential (primary) hypertension: Secondary | ICD-10-CM | POA: Diagnosis present

## 2018-12-21 DIAGNOSIS — R29713 NIHSS score 13: Secondary | ICD-10-CM | POA: Diagnosis present

## 2018-12-21 DIAGNOSIS — I110 Hypertensive heart disease with heart failure: Secondary | ICD-10-CM | POA: Diagnosis present

## 2018-12-21 DIAGNOSIS — I214 Non-ST elevation (NSTEMI) myocardial infarction: Secondary | ICD-10-CM | POA: Diagnosis present

## 2018-12-21 DIAGNOSIS — Z955 Presence of coronary angioplasty implant and graft: Secondary | ICD-10-CM

## 2018-12-21 DIAGNOSIS — E669 Obesity, unspecified: Secondary | ICD-10-CM | POA: Diagnosis present

## 2018-12-21 DIAGNOSIS — Z87891 Personal history of nicotine dependence: Secondary | ICD-10-CM

## 2018-12-21 DIAGNOSIS — Z6833 Body mass index (BMI) 33.0-33.9, adult: Secondary | ICD-10-CM

## 2018-12-21 DIAGNOSIS — M25512 Pain in left shoulder: Secondary | ICD-10-CM | POA: Diagnosis present

## 2018-12-21 DIAGNOSIS — I63412 Cerebral infarction due to embolism of left middle cerebral artery: Secondary | ICD-10-CM | POA: Diagnosis present

## 2018-12-21 DIAGNOSIS — I2583 Coronary atherosclerosis due to lipid rich plaque: Secondary | ICD-10-CM | POA: Diagnosis not present

## 2018-12-21 DIAGNOSIS — I513 Intracardiac thrombosis, not elsewhere classified: Secondary | ICD-10-CM | POA: Diagnosis not present

## 2018-12-21 DIAGNOSIS — R4182 Altered mental status, unspecified: Secondary | ICD-10-CM | POA: Diagnosis present

## 2018-12-21 DIAGNOSIS — E876 Hypokalemia: Secondary | ICD-10-CM | POA: Diagnosis present

## 2018-12-21 DIAGNOSIS — I2129 ST elevation (STEMI) myocardial infarction involving other sites: Secondary | ICD-10-CM

## 2018-12-21 DIAGNOSIS — G8191 Hemiplegia, unspecified affecting right dominant side: Secondary | ICD-10-CM | POA: Diagnosis present

## 2018-12-21 DIAGNOSIS — Z7902 Long term (current) use of antithrombotics/antiplatelets: Secondary | ICD-10-CM | POA: Diagnosis not present

## 2018-12-21 DIAGNOSIS — Z7982 Long term (current) use of aspirin: Secondary | ICD-10-CM | POA: Diagnosis not present

## 2018-12-21 DIAGNOSIS — R4701 Aphasia: Secondary | ICD-10-CM | POA: Diagnosis present

## 2018-12-21 DIAGNOSIS — I6602 Occlusion and stenosis of left middle cerebral artery: Secondary | ICD-10-CM | POA: Diagnosis present

## 2018-12-21 DIAGNOSIS — I2510 Atherosclerotic heart disease of native coronary artery without angina pectoris: Secondary | ICD-10-CM | POA: Diagnosis present

## 2018-12-21 DIAGNOSIS — E46 Unspecified protein-calorie malnutrition: Secondary | ICD-10-CM | POA: Diagnosis not present

## 2018-12-21 DIAGNOSIS — I5021 Acute systolic (congestive) heart failure: Secondary | ICD-10-CM | POA: Diagnosis not present

## 2018-12-21 HISTORY — PX: IR CT HEAD LTD: IMG2386

## 2018-12-21 HISTORY — PX: RADIOLOGY WITH ANESTHESIA: SHX6223

## 2018-12-21 HISTORY — PX: IR PERCUTANEOUS ART THROMBECTOMY/INFUSION INTRACRANIAL INC DIAG ANGIO: IMG6087

## 2018-12-21 LAB — POCT I-STAT 7, (LYTES, BLD GAS, ICA,H+H)
Acid-base deficit: 1 mmol/L (ref 0.0–2.0)
Bicarbonate: 23.9 mmol/L (ref 20.0–28.0)
Calcium, Ion: 1.15 mmol/L (ref 1.15–1.40)
HCT: 38 % — ABNORMAL LOW (ref 39.0–52.0)
Hemoglobin: 12.9 g/dL — ABNORMAL LOW (ref 13.0–17.0)
O2 Saturation: 100 %
Potassium: 3.7 mmol/L (ref 3.5–5.1)
Sodium: 140 mmol/L (ref 135–145)
TCO2: 25 mmol/L (ref 22–32)
pCO2 arterial: 38.4 mmHg (ref 32.0–48.0)
pH, Arterial: 7.401 (ref 7.350–7.450)
pO2, Arterial: 336 mmHg — ABNORMAL HIGH (ref 83.0–108.0)

## 2018-12-21 LAB — CBC
HCT: 42.7 % (ref 39.0–52.0)
Hemoglobin: 14.7 g/dL (ref 13.0–17.0)
MCH: 29.7 pg (ref 26.0–34.0)
MCHC: 34.4 g/dL (ref 30.0–36.0)
MCV: 86.3 fL (ref 80.0–100.0)
Platelets: 322 10*3/uL (ref 150–400)
RBC: 4.95 MIL/uL (ref 4.22–5.81)
RDW: 13.3 % (ref 11.5–15.5)
WBC: 7.5 10*3/uL (ref 4.0–10.5)
nRBC: 0 % (ref 0.0–0.2)

## 2018-12-21 LAB — PROTIME-INR
INR: 1.1 (ref 0.8–1.2)
Prothrombin Time: 13.6 seconds (ref 11.4–15.2)

## 2018-12-21 LAB — COMPREHENSIVE METABOLIC PANEL
ALT: 14 U/L (ref 0–44)
AST: 20 U/L (ref 15–41)
Albumin: 3.7 g/dL (ref 3.5–5.0)
Alkaline Phosphatase: 68 U/L (ref 38–126)
Anion gap: 11 (ref 5–15)
BUN: 13 mg/dL (ref 8–23)
CO2: 22 mmol/L (ref 22–32)
Calcium: 8.6 mg/dL — ABNORMAL LOW (ref 8.9–10.3)
Chloride: 105 mmol/L (ref 98–111)
Creatinine, Ser: 0.97 mg/dL (ref 0.61–1.24)
GFR calc Af Amer: >60 mL/min (ref >60)
GFR calc non Af Amer: >60 mL/min (ref >60)
Glucose, Bld: 87 mg/dL (ref 70–99)
Potassium: 4 mmol/L (ref 3.5–5.1)
Sodium: 138 mmol/L (ref 135–145)
Total Bilirubin: 0.7 mg/dL (ref 0.3–1.2)
Total Protein: 7.1 g/dL (ref 6.5–8.1)

## 2018-12-21 LAB — DIFFERENTIAL
Abs Immature Granulocytes: 0.02 10*3/uL (ref 0.00–0.07)
Basophils Absolute: 0 10*3/uL (ref 0.0–0.1)
Basophils Relative: 1 %
Eosinophils Absolute: 0.2 10*3/uL (ref 0.0–0.5)
Eosinophils Relative: 3 %
Immature Granulocytes: 0 %
Lymphocytes Relative: 32 %
Lymphs Abs: 2.4 10*3/uL (ref 0.7–4.0)
Monocytes Absolute: 0.7 10*3/uL (ref 0.1–1.0)
Monocytes Relative: 10 %
Neutro Abs: 4.1 10*3/uL (ref 1.7–7.7)
Neutrophils Relative %: 54 %

## 2018-12-21 LAB — APTT: aPTT: 28 seconds (ref 24–36)

## 2018-12-21 LAB — ETHANOL: Alcohol, Ethyl (B): <10 mg/dL (ref <10)

## 2018-12-21 LAB — GLUCOSE, CAPILLARY
Glucose-Capillary: 85 mg/dL (ref 70–99)
Glucose-Capillary: 76 mg/dL (ref 70–99)

## 2018-12-21 LAB — CBG MONITORING, ED: Glucose-Capillary: 83 mg/dL (ref 70–99)

## 2018-12-21 LAB — MRSA PCR SCREENING: MRSA by PCR: NEGATIVE

## 2018-12-21 LAB — I-STAT TROPONIN, ED: Troponin i, poc: 0 ng/mL (ref 0.00–0.08)

## 2018-12-21 LAB — I-STAT CREATININE, ED: Creatinine, Ser: 0.9 mg/dL (ref 0.61–1.24)

## 2018-12-21 SURGERY — RADIOLOGY WITH ANESTHESIA
Anesthesia: General

## 2018-12-21 MED ORDER — SODIUM CHLORIDE 0.9 % IV SOLN
INTRAVENOUS | Status: DC | PRN
  Administered 2018-12-21: 25 ug/min via INTRAVENOUS

## 2018-12-21 MED ORDER — IOHEXOL 350 MG/ML SOLN
100.0000 mL | Freq: Once | INTRAVENOUS | Status: AC | PRN
  Administered 2018-12-21: 100 mL via INTRAVENOUS

## 2018-12-21 MED ORDER — ACETAMINOPHEN 325 MG PO TABS: 650.0000 mg | ORAL_TABLET | ORAL | Status: DC | PRN

## 2018-12-21 MED ORDER — CLEVIDIPINE BUTYRATE 0.5 MG/ML IV EMUL
INTRAVENOUS | Status: AC
  Filled 2018-12-21: qty 50

## 2018-12-21 MED ORDER — PROPOFOL 500 MG/50ML IV EMUL
INTRAVENOUS | Status: DC | PRN
  Administered 2018-12-21: 150 ug/kg/min via INTRAVENOUS

## 2018-12-21 MED ORDER — MIDAZOLAM HCL 2 MG/2ML IJ SOLN
2.0000 mg | INTRAMUSCULAR | Status: DC | PRN
  Administered 2018-12-22: 2 mg via INTRAVENOUS
  Filled 2018-12-21: qty 2

## 2018-12-21 MED ORDER — ACETAMINOPHEN 160 MG/5ML PO SOLN: 650.0000 mg | ORAL | Status: DC | PRN

## 2018-12-21 MED ORDER — EPTIFIBATIDE 20 MG/10ML IV SOLN
INTRAVENOUS | Status: AC
  Filled 2018-12-21: qty 10

## 2018-12-21 MED ORDER — FENTANYL CITRATE (PF) 100 MCG/2ML IJ SOLN
100.0000 ug | INTRAMUSCULAR | Status: DC | PRN
  Filled 2018-12-21 (×2): qty 2

## 2018-12-21 MED ORDER — FENTANYL CITRATE (PF) 100 MCG/2ML IJ SOLN
INTRAMUSCULAR | Status: AC
  Filled 2018-12-21: qty 2

## 2018-12-21 MED ORDER — IOHEXOL 300 MG/ML  SOLN
150.0000 mL | Freq: Once | INTRAMUSCULAR | Status: AC | PRN
  Administered 2018-12-21: 100 mL via INTRA_ARTERIAL

## 2018-12-21 MED ORDER — LIDOCAINE HCL 1 % IJ SOLN
INTRAMUSCULAR | Status: AC
  Filled 2018-12-21: qty 20

## 2018-12-21 MED ORDER — ACETAMINOPHEN 650 MG RE SUPP: 650.0000 mg | RECTAL | Status: DC | PRN

## 2018-12-21 MED ORDER — INSULIN ASPART 100 UNIT/ML ~~LOC~~ SOLN
2.0000 [IU] | SUBCUTANEOUS | Status: DC
  Administered 2018-12-24: 4 [IU] via SUBCUTANEOUS

## 2018-12-21 MED ORDER — PANTOPRAZOLE SODIUM 40 MG IV SOLR
40.0000 mg | INTRAVENOUS | Status: DC
  Administered 2018-12-21: 40 mg via INTRAVENOUS
  Filled 2018-12-21: qty 40

## 2018-12-21 MED ORDER — CEFAZOLIN SODIUM-DEXTROSE 2-3 GM-%(50ML) IV SOLR
INTRAVENOUS | Status: DC | PRN
  Administered 2018-12-21: 2 g via INTRAVENOUS

## 2018-12-21 MED ORDER — FENTANYL CITRATE (PF) 100 MCG/2ML IJ SOLN
100.0000 ug | INTRAMUSCULAR | Status: DC | PRN
  Administered 2018-12-21 – 2018-12-22 (×4): 100 ug via INTRAVENOUS
  Filled 2018-12-21 (×2): qty 2

## 2018-12-21 MED ORDER — ATORVASTATIN CALCIUM 80 MG PO TABS
80.0000 mg | ORAL_TABLET | Freq: Every day | ORAL | Status: DC
  Administered 2018-12-22 – 2018-12-26 (×5): 80 mg via ORAL
  Filled 2018-12-21 (×5): qty 1

## 2018-12-21 MED ORDER — SODIUM CHLORIDE 0.9 % IV SOLN
INTRAVENOUS | Status: DC | PRN
  Administered 2018-12-21: 15:00:00 via INTRAVENOUS

## 2018-12-21 MED ORDER — TICAGRELOR 90 MG PO TABS
ORAL_TABLET | ORAL | Status: AC
  Filled 2018-12-21: qty 2

## 2018-12-21 MED ORDER — CLOPIDOGREL BISULFATE 300 MG PO TABS
ORAL_TABLET | ORAL | Status: AC
  Filled 2018-12-21: qty 1

## 2018-12-21 MED ORDER — TIROFIBAN HCL IN NACL 5-0.9 MG/100ML-% IV SOLN
INTRAVENOUS | Status: AC
  Filled 2018-12-21: qty 100

## 2018-12-21 MED ORDER — ASPIRIN 325 MG PO TABS
ORAL_TABLET | ORAL | Status: AC
  Filled 2018-12-21: qty 1

## 2018-12-21 MED ORDER — PHENYLEPHRINE HCL 10 MG/ML IJ SOLN
INTRAMUSCULAR | Status: DC | PRN
  Administered 2018-12-21: 80 ug via INTRAVENOUS

## 2018-12-21 MED ORDER — PROPOFOL 10 MG/ML IV BOLUS
INTRAVENOUS | Status: DC | PRN
  Administered 2018-12-21: 150 mg via INTRAVENOUS

## 2018-12-21 MED ORDER — PROPOFOL 1000 MG/100ML IV EMUL
0.0000 ug/kg/min | INTRAVENOUS | Status: DC
  Administered 2018-12-21 – 2018-12-22 (×3): 50 ug/kg/min via INTRAVENOUS
  Filled 2018-12-21 (×3): qty 100

## 2018-12-21 MED ORDER — SODIUM CHLORIDE 0.9% FLUSH: 3.0000 mL | Freq: Once | INTRAVENOUS | Status: DC

## 2018-12-21 MED ORDER — CEFAZOLIN SODIUM-DEXTROSE 2-4 GM/100ML-% IV SOLN
INTRAVENOUS | Status: AC
  Filled 2018-12-21: qty 100

## 2018-12-21 MED ORDER — MIDAZOLAM HCL 2 MG/2ML IJ SOLN
2.0000 mg | INTRAMUSCULAR | Status: DC | PRN
  Administered 2018-12-21: 2 mg via INTRAVENOUS
  Filled 2018-12-21: qty 2

## 2018-12-21 MED ORDER — FENTANYL CITRATE (PF) 250 MCG/5ML IJ SOLN
INTRAMUSCULAR | Status: DC | PRN
  Administered 2018-12-21 (×2): 50 ug via INTRAVENOUS

## 2018-12-21 MED ORDER — NITROGLYCERIN 1 MG/10 ML FOR IR/CATH LAB
INTRA_ARTERIAL | Status: AC
  Filled 2018-12-21: qty 10

## 2018-12-21 MED ORDER — SUCCINYLCHOLINE CHLORIDE 20 MG/ML IJ SOLN
INTRAMUSCULAR | Status: DC | PRN
  Administered 2018-12-21: 100 mg via INTRAVENOUS

## 2018-12-21 MED ORDER — STROKE: EARLY STAGES OF RECOVERY BOOK: Freq: Once | Status: DC

## 2018-12-21 MED ORDER — CLEVIDIPINE BUTYRATE 0.5 MG/ML IV EMUL
0.0000 mg/h | INTRAVENOUS | Status: AC
  Administered 2018-12-21: 8 mg/h via INTRAVENOUS
  Administered 2018-12-21: 7 mg/h via INTRAVENOUS
  Administered 2018-12-22: 4 mg/h via INTRAVENOUS
  Filled 2018-12-21 (×4): qty 50

## 2018-12-21 MED ORDER — PROPOFOL 1000 MG/100ML IV EMUL
INTRAVENOUS | Status: AC
  Filled 2018-12-21: qty 100

## 2018-12-21 MED ORDER — SENNOSIDES-DOCUSATE SODIUM 8.6-50 MG PO TABS: 1.0000 | ORAL_TABLET | Freq: Every evening | ORAL | Status: DC | PRN

## 2018-12-21 MED ORDER — SODIUM CHLORIDE 0.9 % IV SOLN: INTRAVENOUS | Status: DC

## 2018-12-21 MED ORDER — SODIUM CHLORIDE 0.9 % IV SOLN
INTRAVENOUS | Status: DC
  Administered 2018-12-21 – 2018-12-22 (×2): via INTRAVENOUS

## 2018-12-21 MED ORDER — CARVEDILOL 3.125 MG PO TABS
3.1250 mg | ORAL_TABLET | Freq: Two times a day (BID) | ORAL | Status: DC
  Administered 2018-12-22 – 2018-12-27 (×10): 3.125 mg via ORAL
  Filled 2018-12-21 (×10): qty 1

## 2018-12-21 MED ORDER — ROCURONIUM BROMIDE 50 MG/5ML IV SOSY
PREFILLED_SYRINGE | INTRAVENOUS | Status: DC | PRN
  Administered 2018-12-21: 50 mg via INTRAVENOUS

## 2018-12-21 MED ORDER — ONDANSETRON HCL 4 MG/2ML IJ SOLN
INTRAMUSCULAR | Status: DC | PRN
  Administered 2018-12-21: 4 mg via INTRAVENOUS

## 2018-12-21 MED ORDER — LIDOCAINE 2% (20 MG/ML) 5 ML SYRINGE
INTRAMUSCULAR | Status: DC | PRN
  Administered 2018-12-21: 100 mg via INTRAVENOUS

## 2018-12-21 MED ORDER — SODIUM CHLORIDE (PF) 0.9 % IJ SOLN
INTRAVENOUS | Status: AC | PRN
  Administered 2018-12-21 (×3): 25 ug via INTRA_ARTERIAL

## 2018-12-21 NOTE — Code Documentation (Signed)
Code Stroke 1403  Met patient in CT with RN and MD. NIH was done, NIH 13. CT HEAD was completed, patient is now getting CTA/CTP. Patient was taken back to RM 19 whiles scans were read. Neuro MD informed me that patient has LT M2 occlusion. [facility] contacted by Neuro MD.   Wife - Corey Meyers was called DENIES the following: patient does NOT have fever, chills, cough, SOB, no GI symptoms, no loss of sense of smell/taste, no recent travel, and no contact with someone who is either positive or suspected of COVID. She also denies symptoms, they do not live together.

## 2018-12-21 NOTE — ED Triage Notes (Signed)
Pt BIB [facility] for a code stroke. Per EMS pt was last seen normal at 0730 last evening on 4/2. Pt's wife then found him presented with incomprehensible speech and weakness. Per EMS pt is weaker on the right side. Per EMS pt had an LVO of 5.

## 2018-12-21 NOTE — Progress Notes (Signed)
Patient ID: HUEL CUTRER, male   DOB: 07-Jun-1951, 68 y.o.   MRN: [id number] Post treatment CT NO ICH  Hyperemia of the LT parietal region noted. No mass effect or shift. RT groin hemostasis with 93F exoceal closure device. Distal pulses RT DP dopplerable . RT PT and Lt DP and LT PT palpable. Patient left  Intubated. S.Kesleigh Morson MD

## 2018-12-21 NOTE — Anesthesia Procedure Notes (Signed)
Procedure Name: Intubation Date/Time: 12/21/2018 3:51 PM Performed by: Modena Morrow, CRNA Pre-anesthesia Checklist: Patient identified, Emergency Drugs available, Suction available, Patient being monitored and Timeout performed Patient Re-evaluated:Patient Re-evaluated prior to induction Oxygen Delivery Method: Circle system utilized Preoxygenation: Pre-oxygenation with 100% oxygen Induction Type: IV induction and Rapid sequence Laryngoscope Size: McGraph Hyacinth Meeker 2 Grade 3 view) Grade View: Grade I Tube type: Oral Tube size: 7.5 mm Number of attempts: 2 Airway Equipment and Method: Stylet Placement Confirmation: ETT inserted through vocal cords under direct vision,  positive ETCO2 and breath sounds checked- equal and bilateral Secured at: 23 cm Tube secured with: Tape Dental Injury: Teeth and Oropharynx as per pre-operative assessment

## 2018-12-21 NOTE — Anesthesia Procedure Notes (Signed)
Arterial Line Insertion Start/End4/11/2018 3:28 PM, 12/21/2018 3:30 AM Performed by: Lucinda Dell, CRNA, CRNA  Patient location: OOR procedure area. Left, radial was placed Catheter size: 20 G Hand hygiene performed  and maximum sterile barriers used   Attempts: 1 Procedure performed without using ultrasound guided technique. Following insertion, dressing applied. Post procedure assessment: normal  Patient tolerated the procedure well with no immediate complications.

## 2018-12-21 NOTE — Consult Note (Signed)
NAME:  Corey Meyers, MRN:  [id number], DOB:  [birth year], LOS: 0 ADMISSION DATE:  12/21/2018, CONSULTATION DATE:  12/21/2018  REFERRING MD:  Dr Otelia Limes, CHIEF COMPLAINT:  Stroke on vent   Brief History   See hpi  History of present illness    68 yr male with obesity, with right sided weakness and aphasia. CTA showed occluded LEft MCA M2 region and s.p endovasculare revascularizatio of occluded inferior dvision. Post procedure bbrouhgt to 4N30 ICU on vent and ccm consulted for acute resp failure.  SBP goal 110-140 per neuro IR  Past Medical History     has a past medical history of Hyperlipidemia, Hypertension, and Stroke (HCC).   reports that he has quit smoking. He has never used smokeless tobacco.  Past Surgical History:  Procedure Laterality Date   LAMINECTOMY     TONSILLECTOMY      No Known Allergies   There is no immunization history on file for this patient.  No family history on file.   Current Facility-Administered Medications:     stroke: mapping our early stages of recovery book, , Does not apply, Once, Amin, Tilman Neat, MD   0.9 %  sodium chloride infusion, , Intravenous, Continuous, Amin, Sumayya, MD   0.9 %  sodium chloride infusion, , Intravenous, Continuous, Deveshwar, Sanjeev, MD   acetaminophen (TYLENOL) tablet 650 mg, 650 mg, Oral, Q4H PRN **OR** acetaminophen (TYLENOL) solution 650 mg, 650 mg, Per Tube, Q4H PRN **OR** acetaminophen (TYLENOL) suppository 650 mg, 650 mg, Rectal, Q4H PRN, Arnetha Courser, MD   acetaminophen (TYLENOL) tablet 650 mg, 650 mg, Oral, Q4H PRN **OR** acetaminophen (TYLENOL) solution 650 mg, 650 mg, Per Tube, Q4H PRN **OR** acetaminophen (TYLENOL) suppository 650 mg, 650 mg, Rectal, Q4H PRN, Deveshwar, Sanjeev, MD   atorvastatin (LIPITOR) tablet 80 mg, 80 mg, Oral, q1800, Caryl Pina, MD   carvedilol (COREG) tablet 3.125 mg, 3.125 mg, Oral, BID WC, Caryl Pina, MD   ceFAZolin (ANCEF) 2-4 GM/100ML-% IVPB, , , ,    clevidipine  (CLEVIPREX) 0.5 MG/ML infusion, , , ,    clevidipine (CLEVIPREX) infusion 0.5 mg/mL, 0-21 mg/hr, Intravenous, Continuous, Deveshwar, Sanjeev, MD   fentaNYL (SUBLIMAZE) 100 MCG/2ML injection, , , ,    nitroGLYCERIN 100 mcg/mL intra-arterial injection, , , ,    senna-docusate (Senokot-S) tablet 1 tablet, 1 tablet, Oral, QHS PRN, Arnetha Courser, MD   sodium chloride flush (NS) 0.9 % injection 3 mL, 3 mL, Intravenous, Once, Pfeiffer, Marcy, MD   Significant Hospital Events   12/21/2018 - admit  Consults:  Primary neuro 12/21/2018  - ccm  Procedures:  x  Significant Diagnostic Tests:  x  Micro Data:  xz  Antimicrobials:   Antibiotics Given (last 72 hours)    None        Interim history/subjective:   - intubated  Objective   Height 6' (1.829 m), weight 111.1 kg.    Vent Mode: PRVC FiO2 (%):  [40 %-100 %] 40 % Set Rate:  [18 bmp] 18 bmp Vt Set:  [620 mL] 620 mL PEEP:  [5 cmH20] 5 cmH20 Plateau Pressure:  [17 cmH20] 17 cmH20   Intake/Output Summary (Last 24 hours) at 12/21/2018 1819 Last data filed at 12/21/2018 1757 Gross per 24 hour  Intake 800 ml  Output 350 ml  Net 450 ml   Filed Weights   12/21/18 1450  Weight: 111.1 kg    Examination: General: obese on vent HENT: et tube + Lungs: Sync with vent Cardiovascular: normal heart sound Abdomen:  obese Extremities: ingact Neuro: SEdated GU: intact   LABS    PULMONARY Recent Labs  Lab 12/21/18 1757  PHART 7.401  PCO2ART 38.4  PO2ART 336.0*  HCO3 23.9  TCO2 25  O2SAT 100.0    CBC Recent Labs  Lab 12/21/18 1401 12/21/18 1757  HGB 14.7 12.9*  HCT 42.7 38.0*  WBC 7.5  --   PLT 322  --     COAGULATION Recent Labs  Lab 12/21/18 1401  INR 1.1    CARDIAC  No results for input(s): TROPONINI in the last 168 hours. No results for input(s): PROBNP in the last 168 hours.   CHEMISTRY Recent Labs  Lab 12/21/18 1401 12/21/18 1408 12/21/18 1757  NA 138  --  140  K 4.0  --  3.7  CL 105   --   --   CO2 22  --   --   GLUCOSE 87  --   --   BUN 13  --   --   CREATININE 0.97 0.90  --   CALCIUM 8.6*  --   --    Estimated Creatinine Clearance: 102.5 mL/min (by C-G formula based on SCr of 0.9 mg/dL).   LIVER Recent Labs  Lab 12/21/18 1401  AST 20  ALT 14  ALKPHOS 68  BILITOT 0.7  PROT 7.1  ALBUMIN 3.7  INR 1.1     INFECTIOUS No results for input(s): LATICACIDVEN, PROCALCITON in the last 168 hours.   ENDOCRINE CBG (last 3)  Recent Labs    12/21/18 1401 12/21/18 1803  GLUCAP 83 85         IMAGING x48h  - image(s) personally visualized  -   highlighted in bold Ct Angio Head W Or Wo Contrast  Result Date: 12/21/2018 CLINICAL DATA:  Acute presentation with speech disturbance and right-sided weakness. EXAM: CT ANGIOGRAPHY HEAD AND NECK CT PERFUSION BRAIN TECHNIQUE: Multidetector CT imaging of the head and neck was performed using the standard protocol during bolus administration of intravenous contrast. Multiplanar CT image reconstructions and MIPs were obtained to evaluate the vascular anatomy. Carotid stenosis measurements (when applicable) are obtained utilizing NASCET criteria, using the distal internal carotid diameter as the denominator. Multiphase CT imaging of the brain was performed following IV bolus contrast injection. Subsequent parametric perfusion maps were calculated using RAPID software. CONTRAST:  OMNIPAQUE IOHEXOL 350 MG/ML SOLN COMPARISON:  Head CT earlier same day FINDINGS: CTA NECK FINDINGS Aortic arch: Minimal atherosclerosis of the arch. No aneurysm or dissection. Branching pattern is normal, with bovine origin pattern. Right carotid system: Common carotid artery is widely patent to the bifurcation. No atherosclerotic plaque at the bifurcation. Minimal soft plaque of the ICA bulb but no stenosis. Cervical ICA is widely patent. Left carotid system: Common carotid artery widely patent to the bifurcation. No plaque at the carotid bifurcation or  ICA bulb. Cervical ICA is widely patent. Vertebral arteries: Both vertebral arteries are widely patent at their origins and through the cervical region to the foramen magnum. Skeleton: Mid cervical spondylosis. Other neck: No mass or lymphadenopathy. Upper chest: Negative Review of the MIP images confirms the above findings CTA HEAD FINDINGS Anterior circulation: On the right, the carotid siphon shows minimal atherosclerotic calcification but no stenosis. The anterior and middle cerebral vessels are patent. No sign of embolic occlusion on the right. On the left, there is mild atherosclerotic calcification of the siphon but no stenosis. The anterior and middle cerebral vessels are patent. There is an occluded left M2 branch  with some distal reconstitution. Posterior circulation: Both vertebral arteries are widely patent to the basilar. No basilar stenosis. Posterior circulation branch vessels are normal. Venous sinuses: Patent and normal. Anatomic variants: None significant. Delayed phase: No abnormal enhancement. Review of the MIP images confirms the above findings CT Brain Perfusion Findings: CBF (<30%) Volume: 0mL Perfusion (Tmax>6.0s) volume: 34mL Mismatch Volume: 34mL Infarction Location:No infarction. Area of delayed perfusion is in the left posterior frontal and parietal cortical and subcortical brain. M 5 and M 6 aspects territory. IMPRESSION: Embolic occlusion of a left M2 branch. T-max greater than 6 seconds in the left posterior frontal and parietal cortical and subcortical brain, without evidence of actual completed infarction by perfusion imaging. The findings in the basal ganglia and insula must relate to an old insult. I am somewhat surprised at the well visible low-density in the area of penumbra. However, given all this data, patient would seem to be a candidate for intervention. These results were called by telephone at the time of interpretation on 12/21/2018 at 2:40 pm to Dr. Otelia Limes, who verbally  acknowledged these results. Electronically Signed   By: Paulina Fusi M.D.   On: 12/21/2018 14:45   Ct Angio Neck W Or Wo Contrast  Result Date: 12/21/2018 CLINICAL DATA:  Acute presentation with speech disturbance and right-sided weakness. EXAM: CT ANGIOGRAPHY HEAD AND NECK CT PERFUSION BRAIN TECHNIQUE: Multidetector CT imaging of the head and neck was performed using the standard protocol during bolus administration of intravenous contrast. Multiplanar CT image reconstructions and MIPs were obtained to evaluate the vascular anatomy. Carotid stenosis measurements (when applicable) are obtained utilizing NASCET criteria, using the distal internal carotid diameter as the denominator. Multiphase CT imaging of the brain was performed following IV bolus contrast injection. Subsequent parametric perfusion maps were calculated using RAPID software. CONTRAST:  OMNIPAQUE IOHEXOL 350 MG/ML SOLN COMPARISON:  Head CT earlier same day FINDINGS: CTA NECK FINDINGS Aortic arch: Minimal atherosclerosis of the arch. No aneurysm or dissection. Branching pattern is normal, with bovine origin pattern. Right carotid system: Common carotid artery is widely patent to the bifurcation. No atherosclerotic plaque at the bifurcation. Minimal soft plaque of the ICA bulb but no stenosis. Cervical ICA is widely patent. Left carotid system: Common carotid artery widely patent to the bifurcation. No plaque at the carotid bifurcation or ICA bulb. Cervical ICA is widely patent. Vertebral arteries: Both vertebral arteries are widely patent at their origins and through the cervical region to the foramen magnum. Skeleton: Mid cervical spondylosis. Other neck: No mass or lymphadenopathy. Upper chest: Negative Review of the MIP images confirms the above findings CTA HEAD FINDINGS Anterior circulation: On the right, the carotid siphon shows minimal atherosclerotic calcification but no stenosis. The anterior and middle cerebral vessels are patent.  No sign of embolic occlusion on the right. On the left, there is mild atherosclerotic calcification of the siphon but no stenosis. The anterior and middle cerebral vessels are patent. There is an occluded left M2 branch with some distal reconstitution. Posterior circulation: Both vertebral arteries are widely patent to the basilar. No basilar stenosis. Posterior circulation branch vessels are normal. Venous sinuses: Patent and normal. Anatomic variants: None significant. Delayed phase: No abnormal enhancement. Review of the MIP images confirms the above findings CT Brain Perfusion Findings: CBF (<30%) Volume: 0mL Perfusion (Tmax>6.0s) volume: 34mL Mismatch Volume: 34mL Infarction Location:No infarction. Area of delayed perfusion is in the left posterior frontal and parietal cortical and subcortical brain. M 5 and M 6 aspects territory.  IMPRESSION: Embolic occlusion of a left M2 branch. T-max greater than 6 seconds in the left posterior frontal and parietal cortical and subcortical brain, without evidence of actual completed infarction by perfusion imaging. The findings in the basal ganglia and insula must relate to an old insult. I am somewhat surprised at the well visible low-density in the area of penumbra. However, given all this data, patient would seem to be a candidate for intervention. These results were called by telephone at the time of interpretation on 12/21/2018 at 2:40 pm to Dr. Otelia Limes, who verbally acknowledged these results. Electronically Signed   By: Paulina Fusi M.D.   On: 12/21/2018 14:45   Ct Cerebral Perfusion W Contrast  Result Date: 12/21/2018 CLINICAL DATA:  Acute presentation with speech disturbance and right-sided weakness. EXAM: CT ANGIOGRAPHY HEAD AND NECK CT PERFUSION BRAIN TECHNIQUE: Multidetector CT imaging of the head and neck was performed using the standard protocol during bolus administration of intravenous contrast. Multiplanar CT image reconstructions and MIPs were obtained to  evaluate the vascular anatomy. Carotid stenosis measurements (when applicable) are obtained utilizing NASCET criteria, using the distal internal carotid diameter as the denominator. Multiphase CT imaging of the brain was performed following IV bolus contrast injection. Subsequent parametric perfusion maps were calculated using RAPID software. CONTRAST:  OMNIPAQUE IOHEXOL 350 MG/ML SOLN COMPARISON:  Head CT earlier same day FINDINGS: CTA NECK FINDINGS Aortic arch: Minimal atherosclerosis of the arch. No aneurysm or dissection. Branching pattern is normal, with bovine origin pattern. Right carotid system: Common carotid artery is widely patent to the bifurcation. No atherosclerotic plaque at the bifurcation. Minimal soft plaque of the ICA bulb but no stenosis. Cervical ICA is widely patent. Left carotid system: Common carotid artery widely patent to the bifurcation. No plaque at the carotid bifurcation or ICA bulb. Cervical ICA is widely patent. Vertebral arteries: Both vertebral arteries are widely patent at their origins and through the cervical region to the foramen magnum. Skeleton: Mid cervical spondylosis. Other neck: No mass or lymphadenopathy. Upper chest: Negative Review of the MIP images confirms the above findings CTA HEAD FINDINGS Anterior circulation: On the right, the carotid siphon shows minimal atherosclerotic calcification but no stenosis. The anterior and middle cerebral vessels are patent. No sign of embolic occlusion on the right. On the left, there is mild atherosclerotic calcification of the siphon but no stenosis. The anterior and middle cerebral vessels are patent. There is an occluded left M2 branch with some distal reconstitution. Posterior circulation: Both vertebral arteries are widely patent to the basilar. No basilar stenosis. Posterior circulation branch vessels are normal. Venous sinuses: Patent and normal. Anatomic variants: None significant. Delayed phase: No abnormal  enhancement. Review of the MIP images confirms the above findings CT Brain Perfusion Findings: CBF (<30%) Volume: 0mL Perfusion (Tmax>6.0s) volume: 34mL Mismatch Volume: 34mL Infarction Location:No infarction. Area of delayed perfusion is in the left posterior frontal and parietal cortical and subcortical brain. M 5 and M 6 aspects territory. IMPRESSION: Embolic occlusion of a left M2 branch. T-max greater than 6 seconds in the left posterior frontal and parietal cortical and subcortical brain, without evidence of actual completed infarction by perfusion imaging. The findings in the basal ganglia and insula must relate to an old insult. I am somewhat surprised at the well visible low-density in the area of penumbra. However, given all this data, patient would seem to be a candidate for intervention. These results were called by telephone at the time of interpretation on 12/21/2018 at 2:40  pm to Dr. Otelia Limes, who verbally acknowledged these results. Electronically Signed   By: Paulina Fusi M.D.   On: 12/21/2018 14:45   Ct Head Code Stroke Wo Contrast  Result Date: 12/21/2018 CLINICAL DATA:  Code stroke. Speech disturbance. Right-sided weakness. EXAM: CT HEAD WITHOUT CONTRAST TECHNIQUE: Contiguous axial images were obtained from the base of the skull through the vertex without intravenous contrast. COMPARISON:  None. FINDINGS: Brain: Evidence of acute/subacute ischemic change with loss of gray-white differentiation affecting the deep insula, M 5 and M 6 regions. Mild swelling. No hemorrhage. Probable involvement also of the corpus striated item on the left as well as the posterior limb internal capsule. No acute finding on the right. No evidence of definite pre-existing infarction. It is possible that some of the left basal ganglia changes could be old. No hydrocephalus. No extra-axial collection. Vascular: 1 could question slight asymmetric hyperdensity of the left middle cerebral artery. Skull: Negative  Sinuses/Orbits: Ordinary mild seasonal mucosal inflammatory change. Orbits negative. Other: None ASPECTS ([place] Stroke Program Early CT Score) - Ganglionic level infarction (caudate, lentiform nuclei, internal capsule, insula, M1-M3 cortex): 3 - Supraganglionic infarction (M4-M6 cortex): 1 Total score (0-10 with 10 being normal): 4 IMPRESSION: 1. Ischemic changes in the left MCA territory consistent with acute/subacute infarction. Involvement of the left caudate, lentiform nuclei, deep insula, internal capsule, M 5 and M 6 regions. No sign of hemorrhage or mass effect. 2. ASPECTS is 4 3. These results were communicated to Dr. Otelia Limes at 2:21 pmon 4/3/2020by text page via the [facility] messaging system. Electronically Signed   By: Paulina Fusi M.D.   On: 12/21/2018 14:24     Resolved Hospital Problem list   x  Assessment & Plan:  Acute post op resp failure following stroke  PLAN - sbp 110-140 - diprivan gtt for sedation RASS goal 0-2 - full vent support - cxr check - check labs  Neuro primary  Best practice:  Diet: npo Pain/Anxiety/Delirium protocol (if indicated):  VAP protocol (if indicated): hob > 30 degres DVT prophylaxis: scd GI prophylaxis: proptonix Glucose control: ssi Mobility: bed rest Code Status: full Family Communication: none at beside Disposition: icu   ATTESTATION & SIGNATURE   The patient Corey Meyers is critically ill with multiple organ systems failure and requires high complexity decision making for assessment and support, frequent evaluation and titration of therapies, application of advanced monitoring technologies and extensive interpretation of multiple databases.   Critical Care Time devoted to patient care services described in this note is  30  Minutes. This time reflects time of care of this signee Dr Kalman Shan. This critical care time does not reflect procedure time, or teaching time or supervisory time of PA/NP/Med student/Med Resident etc but  could involve care discussion time     Dr. Kalman Shan, M.D., [facility].C.P Pulmonary and Critical Care Medicine Staff Physician [facility] Pager: [phone], If no answer or between  15:00h - 7:00h: call [phone]  12/21/2018 6:37 PM

## 2018-12-21 NOTE — ED Provider Notes (Addendum)
[facility] EMERGENCY DEPARTMENT Provider Note   CSN: [id number] Arrival date & time: 12/21/18  1359    History   Chief Complaint Chief Complaint  Patient presents with   Code Stroke    HPI ABENEZER ODONELL is a 68 y.o. male.     HPI Patient is unable to give history.  History is per EMS.  Reportedly, the patient called his wife around 7:30 PM last night and was normal at that time.  Apparently she came home at 1 PM to find him at home very poorly responsive and confused.  Speech mumbling and soft in nature.  EMS brings the patient as a code stroke.  They report is been hard to localize but he seems slightly more weak on the right.  Patient with much difficulty to follow commands. Past Medical History:  Diagnosis Date   Hyperlipidemia    Hypertension    Stroke (HCC)     There are no active problems to display for this patient.   Past Surgical History:  Procedure Laterality Date   LAMINECTOMY     TONSILLECTOMY          Home Medications    Prior to Admission medications   Medication Sig Start Date End Date Taking? Authorizing Provider  predniSONE (DELTASONE) 10 MG tablet Take 1 tablet (10 mg total) daily by mouth. 6,5,4,3,2,1 six day taper 08/05/17   Evon Slack, PA-C    Family History No family history on file.  Social History Social History   Tobacco Use   Smoking status: Former Smoker   Smokeless tobacco: Never Used  Substance Use Topics   Alcohol use: Yes   Drug use: Not on file     Allergies   Patient has no known allergies.   Review of Systems Review of Systems 5 caveat cannot obtain review of systems due to patient condition.  Physical Exam Updated Vital Signs Ht 6' (1.829 m)    Wt 111.1 kg    BMI 33.22 kg/m   Physical Exam Constitutional:      Comments: Patient is awake and wakes attempts at speech.  He seems very confused.  No respiratory distress.  HENT:     Head: Normocephalic and atraumatic.   Nose:     Comments: Airway is patent and clear.  No pooling secretions.  No sonorous respirations. Eyes:     Extraocular Movements: Extraocular movements intact.     Pupils: Pupils are equal, round, and reactive to light.  Cardiovascular:     Rate and Rhythm: Normal rate and regular rhythm.  Pulmonary:     Effort: Pulmonary effort is normal.     Breath sounds: Normal breath sounds.  Abdominal:     Palpations: Abdomen is soft.     Tenderness: There is no abdominal tenderness. There is no guarding.  Musculoskeletal: Normal range of motion.        General: No swelling or tenderness.  Skin:    General: Skin is warm and dry.  Neurological:     Comments: Patient speech is garbled and very difficult to understand.  He has limited ability to make verbal responses.  He makes slight attempt at grip strength on the left.  He seems globally weak.      ED Treatments / Results  Labs (all labs ordered are listed, but only abnormal results are displayed) Labs Reviewed  COMPREHENSIVE METABOLIC PANEL - Abnormal; Notable for the following components:      Result Value  Calcium 8.6 (*)    All other components within normal limits  PROTIME-INR  APTT  CBC  DIFFERENTIAL  ETHANOL  RAPID URINE DRUG SCREEN, HOSP PERFORMED  URINALYSIS, ROUTINE W REFLEX MICROSCOPIC  I-STAT CREATININE, ED  CBG MONITORING, ED  I-STAT TROPONIN, ED  I-STAT CREATININE, ED  I-STAT CREATININE, ED    EKG EKG Interpretation  Date/Time:  Friday December 21 2018 14:40:21 EDT Ventricular Rate:  79 PR Interval:    QRS Duration: 102 QT Interval:  400 QTC Calculation: 459 R Axis:   51 Text Interpretation:  Sinus rhythm Anterior infarct, old Nonspecific T abnormalities, lateral leads agree. no STEMI, sT ELEVATION IMPROVED COMPARED TO PRIOR TRACINGS Confirmed by Arby Barrette [phone]) on 12/22/2018 10:36:28 AM   Radiology Ct Angio Head W Or Wo Contrast  Result Date: 12/21/2018 CLINICAL DATA:  Acute presentation with  speech disturbance and right-sided weakness. EXAM: CT ANGIOGRAPHY HEAD AND NECK CT PERFUSION BRAIN TECHNIQUE: Multidetector CT imaging of the head and neck was performed using the standard protocol during bolus administration of intravenous contrast. Multiplanar CT image reconstructions and MIPs were obtained to evaluate the vascular anatomy. Carotid stenosis measurements (when applicable) are obtained utilizing NASCET criteria, using the distal internal carotid diameter as the denominator. Multiphase CT imaging of the brain was performed following IV bolus contrast injection. Subsequent parametric perfusion maps were calculated using RAPID software. CONTRAST:  OMNIPAQUE IOHEXOL 350 MG/ML SOLN COMPARISON:  Head CT earlier same day FINDINGS: CTA NECK FINDINGS Aortic arch: Minimal atherosclerosis of the arch. No aneurysm or dissection. Branching pattern is normal, with bovine origin pattern. Right carotid system: Common carotid artery is widely patent to the bifurcation. No atherosclerotic plaque at the bifurcation. Minimal soft plaque of the ICA bulb but no stenosis. Cervical ICA is widely patent. Left carotid system: Common carotid artery widely patent to the bifurcation. No plaque at the carotid bifurcation or ICA bulb. Cervical ICA is widely patent. Vertebral arteries: Both vertebral arteries are widely patent at their origins and through the cervical region to the foramen magnum. Skeleton: Mid cervical spondylosis. Other neck: No mass or lymphadenopathy. Upper chest: Negative Review of the MIP images confirms the above findings CTA HEAD FINDINGS Anterior circulation: On the right, the carotid siphon shows minimal atherosclerotic calcification but no stenosis. The anterior and middle cerebral vessels are patent. No sign of embolic occlusion on the right. On the left, there is mild atherosclerotic calcification of the siphon but no stenosis. The anterior and middle cerebral vessels are patent. There is an  occluded left M2 branch with some distal reconstitution. Posterior circulation: Both vertebral arteries are widely patent to the basilar. No basilar stenosis. Posterior circulation branch vessels are normal. Venous sinuses: Patent and normal. Anatomic variants: None significant. Delayed phase: No abnormal enhancement. Review of the MIP images confirms the above findings CT Brain Perfusion Findings: CBF (<30%) Volume: 0mL Perfusion (Tmax>6.0s) volume: 34mL Mismatch Volume: 34mL Infarction Location:No infarction. Area of delayed perfusion is in the left posterior frontal and parietal cortical and subcortical brain. M 5 and M 6 aspects territory. IMPRESSION: Embolic occlusion of a left M2 branch. T-max greater than 6 seconds in the left posterior frontal and parietal cortical and subcortical brain, without evidence of actual completed infarction by perfusion imaging. The findings in the basal ganglia and insula must relate to an old insult. I am somewhat surprised at the well visible low-density in the area of penumbra. However, given all this data, patient would seem to be a candidate for  intervention. These results were called by telephone at the time of interpretation on 12/21/2018 at 2:40 pm to Dr. Otelia Limes, who verbally acknowledged these results. Electronically Signed   By: Paulina Fusi M.D.   On: 12/21/2018 14:45   Ct Angio Neck W Or Wo Contrast  Result Date: 12/21/2018 CLINICAL DATA:  Acute presentation with speech disturbance and right-sided weakness. EXAM: CT ANGIOGRAPHY HEAD AND NECK CT PERFUSION BRAIN TECHNIQUE: Multidetector CT imaging of the head and neck was performed using the standard protocol during bolus administration of intravenous contrast. Multiplanar CT image reconstructions and MIPs were obtained to evaluate the vascular anatomy. Carotid stenosis measurements (when applicable) are obtained utilizing NASCET criteria, using the distal internal carotid diameter as the denominator. Multiphase CT  imaging of the brain was performed following IV bolus contrast injection. Subsequent parametric perfusion maps were calculated using RAPID software. CONTRAST:  OMNIPAQUE IOHEXOL 350 MG/ML SOLN COMPARISON:  Head CT earlier same day FINDINGS: CTA NECK FINDINGS Aortic arch: Minimal atherosclerosis of the arch. No aneurysm or dissection. Branching pattern is normal, with bovine origin pattern. Right carotid system: Common carotid artery is widely patent to the bifurcation. No atherosclerotic plaque at the bifurcation. Minimal soft plaque of the ICA bulb but no stenosis. Cervical ICA is widely patent. Left carotid system: Common carotid artery widely patent to the bifurcation. No plaque at the carotid bifurcation or ICA bulb. Cervical ICA is widely patent. Vertebral arteries: Both vertebral arteries are widely patent at their origins and through the cervical region to the foramen magnum. Skeleton: Mid cervical spondylosis. Other neck: No mass or lymphadenopathy. Upper chest: Negative Review of the MIP images confirms the above findings CTA HEAD FINDINGS Anterior circulation: On the right, the carotid siphon shows minimal atherosclerotic calcification but no stenosis. The anterior and middle cerebral vessels are patent. No sign of embolic occlusion on the right. On the left, there is mild atherosclerotic calcification of the siphon but no stenosis. The anterior and middle cerebral vessels are patent. There is an occluded left M2 branch with some distal reconstitution. Posterior circulation: Both vertebral arteries are widely patent to the basilar. No basilar stenosis. Posterior circulation branch vessels are normal. Venous sinuses: Patent and normal. Anatomic variants: None significant. Delayed phase: No abnormal enhancement. Review of the MIP images confirms the above findings CT Brain Perfusion Findings: CBF (<30%) Volume: 0mL Perfusion (Tmax>6.0s) volume: 34mL Mismatch Volume: 34mL Infarction Location:No  infarction. Area of delayed perfusion is in the left posterior frontal and parietal cortical and subcortical brain. M 5 and M 6 aspects territory. IMPRESSION: Embolic occlusion of a left M2 branch. T-max greater than 6 seconds in the left posterior frontal and parietal cortical and subcortical brain, without evidence of actual completed infarction by perfusion imaging. The findings in the basal ganglia and insula must relate to an old insult. I am somewhat surprised at the well visible low-density in the area of penumbra. However, given all this data, patient would seem to be a candidate for intervention. These results were called by telephone at the time of interpretation on 12/21/2018 at 2:40 pm to Dr. Otelia Limes, who verbally acknowledged these results. Electronically Signed   By: Paulina Fusi M.D.   On: 12/21/2018 14:45   Ct Cerebral Perfusion W Contrast  Result Date: 12/21/2018 CLINICAL DATA:  Acute presentation with speech disturbance and right-sided weakness. EXAM: CT ANGIOGRAPHY HEAD AND NECK CT PERFUSION BRAIN TECHNIQUE: Multidetector CT imaging of the head and neck was performed using the standard protocol during bolus administration  of intravenous contrast. Multiplanar CT image reconstructions and MIPs were obtained to evaluate the vascular anatomy. Carotid stenosis measurements (when applicable) are obtained utilizing NASCET criteria, using the distal internal carotid diameter as the denominator. Multiphase CT imaging of the brain was performed following IV bolus contrast injection. Subsequent parametric perfusion maps were calculated using RAPID software. CONTRAST:  OMNIPAQUE IOHEXOL 350 MG/ML SOLN COMPARISON:  Head CT earlier same day FINDINGS: CTA NECK FINDINGS Aortic arch: Minimal atherosclerosis of the arch. No aneurysm or dissection. Branching pattern is normal, with bovine origin pattern. Right carotid system: Common carotid artery is widely patent to the bifurcation. No atherosclerotic plaque  at the bifurcation. Minimal soft plaque of the ICA bulb but no stenosis. Cervical ICA is widely patent. Left carotid system: Common carotid artery widely patent to the bifurcation. No plaque at the carotid bifurcation or ICA bulb. Cervical ICA is widely patent. Vertebral arteries: Both vertebral arteries are widely patent at their origins and through the cervical region to the foramen magnum. Skeleton: Mid cervical spondylosis. Other neck: No mass or lymphadenopathy. Upper chest: Negative Review of the MIP images confirms the above findings CTA HEAD FINDINGS Anterior circulation: On the right, the carotid siphon shows minimal atherosclerotic calcification but no stenosis. The anterior and middle cerebral vessels are patent. No sign of embolic occlusion on the right. On the left, there is mild atherosclerotic calcification of the siphon but no stenosis. The anterior and middle cerebral vessels are patent. There is an occluded left M2 branch with some distal reconstitution. Posterior circulation: Both vertebral arteries are widely patent to the basilar. No basilar stenosis. Posterior circulation branch vessels are normal. Venous sinuses: Patent and normal. Anatomic variants: None significant. Delayed phase: No abnormal enhancement. Review of the MIP images confirms the above findings CT Brain Perfusion Findings: CBF (<30%) Volume: 64mL Perfusion (Tmax>6.0s) volume: 14mL Mismatch Volume: 24mL Infarction Location:No infarction. Area of delayed perfusion is in the left posterior frontal and parietal cortical and subcortical brain. M 5 and M 6 aspects territory. IMPRESSION: Embolic occlusion of a left M2 branch. T-max greater than 6 seconds in the left posterior frontal and parietal cortical and subcortical brain, without evidence of actual completed infarction by perfusion imaging. The findings in the basal ganglia and insula must relate to an old insult. I am somewhat surprised at the well visible low-density in the area  of penumbra. However, given all this data, patient would seem to be a candidate for intervention. These results were called by telephone at the time of interpretation on 12/21/2018 at 2:40 pm to Dr. Otelia Limes, who verbally acknowledged these results. Electronically Signed   By: Paulina Fusi M.D.   On: 12/21/2018 14:45   Ct Head Code Stroke Wo Contrast  Result Date: 12/21/2018 CLINICAL DATA:  Code stroke. Speech disturbance. Right-sided weakness. EXAM: CT HEAD WITHOUT CONTRAST TECHNIQUE: Contiguous axial images were obtained from the base of the skull through the vertex without intravenous contrast. COMPARISON:  None. FINDINGS: Brain: Evidence of acute/subacute ischemic change with loss of gray-white differentiation affecting the deep insula, M 5 and M 6 regions. Mild swelling. No hemorrhage. Probable involvement also of the corpus striated item on the left as well as the posterior limb internal capsule. No acute finding on the right. No evidence of definite pre-existing infarction. It is possible that some of the left basal ganglia changes could be old. No hydrocephalus. No extra-axial collection. Vascular: 1 could question slight asymmetric hyperdensity of the left middle cerebral artery. Skull: Negative Sinuses/Orbits:  Ordinary mild seasonal mucosal inflammatory change. Orbits negative. Other: None ASPECTS ([place] Stroke Program Early CT Score) - Ganglionic level infarction (caudate, lentiform nuclei, internal capsule, insula, M1-M3 cortex): 3 - Supraganglionic infarction (M4-M6 cortex): 1 Total score (0-10 with 10 being normal): 4 IMPRESSION: 1. Ischemic changes in the left MCA territory consistent with acute/subacute infarction. Involvement of the left caudate, lentiform nuclei, deep insula, internal capsule, M 5 and M 6 regions. No sign of hemorrhage or mass effect. 2. ASPECTS is 4 3. These results were communicated to Dr. Otelia Limes at 2:21 pmon 4/3/2020by text page via the [facility] messaging system. Electronically  Signed   By: Paulina Fusi M.D.   On: 12/21/2018 14:24    Procedures Procedures (including critical care time) CRITICAL CARE Performed by: Arby Barrette   Total critical care time: 20 minutes  Critical care time was exclusive of separately billable procedures and treating other patients.  Critical care was necessary to treat or prevent imminent or life-threatening deterioration.  Critical care was time spent personally by me on the following activities: development of treatment plan with patient and/or surrogate as well as nursing, discussions with consultants, evaluation of patient's response to treatment, examination of patient, obtaining history from patient or surrogate, ordering and performing treatments and interventions, ordering and review of laboratory studies, ordering and review of radiographic studies, pulse oximetry and re-evaluation of patient's condition. Medications Ordered in ED Medications  sodium chloride flush (NS) 0.9 % injection 3 mL (has no administration in time range)  fentaNYL (SUBLIMAZE) 100 MCG/2ML injection (has no administration in time range)  tirofiban (AGGRASTAT) 5-0.9 MG/100ML-% injection (has no administration in time range)  ticagrelor (BRILINTA) 90 MG tablet (has no administration in time range)  aspirin 325 MG tablet (has no administration in time range)  clopidogrel (PLAVIX) 300 MG tablet (has no administration in time range)  lidocaine (XYLOCAINE) 1 % (with pres) injection (has no administration in time range)  nitroGLYCERIN 100 mcg/mL intra-arterial injection (has no administration in time range)  eptifibatide (INTEGRILIN) 20 MG/10ML injection (has no administration in time range)  iohexol (OMNIPAQUE) 350 MG/ML injection 100 mL (100 mLs Intravenous Contrast Given 12/21/18 1425)     Initial Impression / Assessment and Plan / ED Course  I have reviewed the triage vital signs and the nursing notes.  Pertinent labs & imaging results that were  available during my care of the patient were reviewed by me and considered in my medical decision making (see chart for details).       Patient arrived by EMS with code stroke status.  Patient has been evaluated by neurology and CT positive for CVA.  Patient is awake and attempts to follow commands.  He has dense expressive aphasia and potentially some receptive aphasia although difficult to determine with his lack of verbal capacity.  His airway is stable.  No pooling secretions.  No difficulty breathing or sonorous respirations.  Stable for management by neurology.  Final Clinical Impressions(s) / ED Diagnoses   Final diagnoses:  Stroke [facility])    ED Discharge Orders    None       Arby Barrette, MD 12/22/18 1038    Arby Barrette, MD 12/26/18 [phone]

## 2018-12-21 NOTE — Procedures (Signed)
S/P lt common carotid arteriogram followed by complete revascularization of occluded M 2  Branch of  Lt MCA inf division  Achieving a TICI 2b revascularization

## 2018-12-21 NOTE — H&P (Signed)
Neurology Consultation  Reason for Consult: Code stroke  Referring Physician: Arby Barrette, MD  CC: Altered mental status, right-sided weakness and inability to talk.  History is obtained from: EMS  HPI: Corey Meyers is a 68 y.o. male with PMhx significant for HTN, dyslipidemia and CAD with stents in right PDA and mid LAD brought to ED via EMS when found altered and unresponsive by wife around 1 PM.  Per EMS patient called the wife around 7:30 PM last night and sounds normal, when wife came home today around 1 PM she found him altered and minimally responsive so she called the EMS.  Per EMS patient was mostly aphasic except for a few mumbles, overall weak more pronounced on right and not following commands.  Per EMS no recent illness or sick contacts.  The patient called his wife at 12:30 PM today and his speech was garbled and unintelligible. She thought that maybe he was trying to call her because he knew something was wrong. EMS was called.   Pertinent risk factors for stroke of hypertension, dyslipidemia and coronary artery disease.  Home medications: ASA 81 mg qd Atorvastatin 80 mg qd Brilinta 90 mg BID Carvedilol 3.125 mg BID   LKW: 7:30 PM 0n 12/20/18 tpa given?: no, out of window Premorbid modified Rankin scale (mRS): 0    ROS:  Unable to obtain from patient due to altered mental status. Per wife he has had no SOB, cough, fever, chills, headache, diarrhea, sinus symptoms, anosmia or limb pain. No contact with symptomatic or known Covid19-positive individuals. No abdominal pain.    Past Medical History:  Diagnosis Date  . Hyperlipidemia   . Hypertension   . Stroke [facility])     No family history on file.  Social History:   reports that he has quit smoking. He has never used smokeless tobacco. He reports current alcohol use. No history on file for drug.  Medications  Current Facility-Administered Medications:  .  sodium chloride flush (NS) 0.9 % injection 3 mL, 3 mL,  Intravenous, Once, Pfeiffer, Marcy, MD  Current Outpatient Medications:  .  predniSONE (DELTASONE) 10 MG tablet, Take 1 tablet (10 mg total) daily by mouth. 6,5,4,3,2,1 six day taper, Disp: 21 tablet, Rfl: 0   Exam: Current vital signs: There were no vitals taken for this visit. Vital signs in last 24 hours:    Physical Exam  Constitutional: Appears well-developed and well-nourished.  Eyes: No scleral injection HENT: No OP obstrucion Head: Normocephalic.  Cardiovascular: Normal rate and regular rhythm.  Respiratory: Effort normal, non-labored breathing. Normal breath sounds.  GI: Soft.  No distension. There is no tenderness.  Skin: WDI  Neuro: Mental Status: Patient is awake, following only about 20% of simple motor commands, most requiring constant repetition for a motor response.  Mumbles few words which are unintelligible. Perseverative verbalizations. Does not fixate visually on examiner. Cranial Nerves: II: Does not blink to threat on the right. PERRL III,IV, VI: Eyes conjugate without forced deviation. Will gaze to left and right. No nystagmus.   V: Unable to formally assess. VII: Facial movement is mildly decreased periorally on the right.  VIII: hearing is intact to voice X: Unable to visualize palate XI: Head is midline XII: tongue is midline  Motor: Moves LUE more briskly to command and resists with 5/5 strength. No drift.  Moves RUE with significant delay but can resist with 5/5 strength. Intermittent drift.  LLE elevates with own power, resists with 5/5 strength, no drift. RLE with  drift, resists with 4/5 strength Sensory: Will respond to stimulation x 4. No definite asymmetry. Unable to more definitively assess due to aphasia.  Deep Tendon Reflexes:  1-2+ with equivocal asymmetry upper and lower extremities.   Cerebellar/Gait: Unable to assess.   NIHSS: 13   Labs I have reviewed labs in epic and the results pertinent to this consultation are:  CBC     Component Value Date/Time   WBC 16.7 (H) 02/12/2014 0210   RBC 4.35 (L) 02/12/2014 0210   HGB 13.1 02/12/2014 0210   HCT 38.7 (L) 02/12/2014 0210   PLT 291 02/12/2014 0210   MCV 89 02/12/2014 0210   MCH 30.0 02/12/2014 0210   MCHC 33.7 02/12/2014 0210   RDW 14.4 02/12/2014 0210   LYMPHSABS 1.9 02/12/2014 0210   MONOABS 1.7 (H) 02/12/2014 0210   EOSABS 0.0 02/12/2014 0210   BASOSABS 0.1 02/12/2014 0210    CMP     Component Value Date/Time   NA 135 (L) 02/12/2014 0210   K 3.5 02/12/2014 0210   CL 102 02/12/2014 0210   CO2 26 02/12/2014 0210   GLUCOSE 128 (H) 02/12/2014 0210   BUN 11 02/12/2014 0210   CREATININE 0.73 02/12/2014 0210   CALCIUM 8.4 (L) 02/12/2014 0210   PROT 6.4 02/12/2014 0210   ALBUMIN 3.0 (L) 02/12/2014 0210   AST 336 (H) 02/12/2014 0210   ALT 57 02/12/2014 0210   ALKPHOS 65 02/12/2014 0210   BILITOT 0.5 02/12/2014 0210   GFRNONAA >60 02/12/2014 0210   GFRAA >60 02/12/2014 0210    Lipid Panel     Component Value Date/Time   CHOL 134 02/12/2014 0210   TRIG 122 02/12/2014 0210   HDL 36 (L) 02/12/2014 0210   VLDL 24 02/12/2014 0210   LDLCALC 74 02/12/2014 0210     Imaging I have reviewed the images obtained:  CT head: 1. Ischemic changes in the left MCA territory consistent with acute/subacute infarction. Involvement of the left caudate, lentiform nuclei, deep insula, internal capsule, M 5 and M 6 regions. No sign of hemorrhage or mass effect. 2. ASPECTS is 4  CTA of head and neck with CTP: Embolic occlusion of a left M2 branch. T-max greater than 6 seconds in the left posterior frontal and parietal cortical and subcortical brain, without evidence of actual completed infarction by perfusion imaging. The findings in the basal ganglia and insula must relate to an old insult. I am somewhat surprised at the well visible low-density in the area of penumbra. However, given all this data, patient would seem to be a candidate for  intervention.   Assessment: 68 year old male with acute left M2 occlusion and ischemia of 34 cc volume within the left posterior frontal and parietal cortical and subcortical brain, but no acute infarct core on CTP 1. Out of tPA window due to time criteria 2. The patient is an endovascular candidate. Risks/benefits of cerebral angiogram with possible clot retraction procedure were discussed extensively with his wife. The wife gave preliminary consent for the procedure over the telephone. Her phone numbers are as follows: Cell: [phone]  ----- Home: [phone]. 3. Stroke risk factors: HTN and CAD  Plan: 1. Transporting emergently to VIR. Post-VIR order set to include BP control and frequent neuro checks 2. Admitting to the ICU under the Neurology service following VIR 3. Covid-19 screening by history obtained from wife is negative 4. MRI brain 5. TTE.  6. Cardiac telemetry 7. Hold Brilinta and ASA for at least 24  hours following VIR. Can restart if follow up CT at 24 hours is negative for hemorrhagic conversion 8. DVT prophylaxis with SCDs 9. Continue atorvastatin and carvedilol 10. PT/OT/Speech   60 minutes spent in the emergent neurological evaluation and management of this critically ill acute stroke patient  Electronically signed: Dr. Caryl Pina

## 2018-12-21 NOTE — Sedation Documentation (Signed)
NIH done post revascularization. Pt remains sedated and intubated

## 2018-12-21 NOTE — Anesthesia Preprocedure Evaluation (Addendum)
Anesthesia Evaluation  Patient identified by MRN, date of birth, ID band  Reviewed: Allergy & Precautions, NPO status , Patient's Chart, lab work & pertinent test resultsPreop documentation limited or incomplete due to emergent nature of procedure.  Airway Mallampati: II  TM Distance: >3 FB Neck ROM: Full    Dental no notable dental hx.    Pulmonary neg pulmonary ROS,    Pulmonary exam normal breath sounds clear to auscultation       Cardiovascular hypertension, Normal cardiovascular exam Rhythm:Regular Rate:Normal     Neuro/Psych Acute CVA CVA, Residual Symptoms negative psych ROS   GI/Hepatic negative GI ROS, Neg liver ROS,   Endo/Other  negative endocrine ROS  Renal/GU negative Renal ROS  negative genitourinary   Musculoskeletal negative musculoskeletal ROS (+)   Abdominal   Peds negative pediatric ROS (+)  Hematology negative hematology ROS (+)   Anesthesia Other Findings   Reproductive/Obstetrics negative OB ROS                            Anesthesia Physical Anesthesia Plan  ASA: III and emergent  Anesthesia Plan: General   Post-op Pain Management:    Induction: Intravenous and Rapid sequence  PONV Risk Score and Plan: 2 and Ondansetron and Treatment may vary due to age or medical condition  Airway Management Planned: Oral ETT  Additional Equipment: Arterial line  Intra-op Plan:   Post-operative Plan: Possible Post-op intubation/ventilation  Informed Consent: I have reviewed the patients History and Physical, chart, labs and discussed the procedure including the risks, benefits and alternatives for the proposed anesthesia with the patient or authorized representative who has indicated his/her understanding and acceptance.     Dental advisory given  Plan Discussed with: CRNA and Surgeon  Anesthesia Plan Comments:        Anesthesia Quick Evaluation

## 2018-12-21 NOTE — Sedation Documentation (Signed)
Right groin exoseal D/C'd. 7Fr. Exoseal deployed.

## 2018-12-21 NOTE — Progress Notes (Signed)
Patient ID: Corey Meyers, male   DOB: 02-Apr-1951, 68 y.o.   MRN: [id number] INR. 68 R RH M LSW yweterday ? 7 pm  New onset of aphasia and RT sided weakness. CT Brain NO ICH.CTP No core and T max> 6.0 s vol of 34 ml. CTA occluded Lt MCA M2 region of inf division. mrrs 0. Endovascular revascularization of occluded inf division considered because of the CTP study with no core despite the CT findings and th eloquent area involved.. D/W spouse. Reasons and alternatives discussed Risks oh ICH of 10 % with neurological worsening ,inability to revascularize , vascular injury and death all reviewed. Spouse expressed understanding and provided   witnessed consent over the phone. S.Zamyiah Tino MD

## 2018-12-21 NOTE — Transfer of Care (Signed)
Immediate Anesthesia Transfer of Care Note  Patient: Corey Meyers  Procedure(s) Performed: RADIOLOGY WITH ANESTHESIA (N/A )  Patient Location: ICU  Anesthesia Type:General  Level of Consciousness: Patient remains intubated per anesthesia plan  Airway & Oxygen Therapy: Patient remains intubated per anesthesia plan and Patient placed on Ventilator (see vital sign flow sheet for setting)  Post-op Assessment: Report given to RN and Post -op Vital signs reviewed and stable  Post vital signs: Reviewed and stable  Last Vitals:  Vitals Value Taken Time  BP 98/76 12/21/2018  5:54 PM  Temp    Pulse 88 12/21/2018  5:57 PM  Resp 18 12/21/2018  5:57 PM  SpO2 99 % 12/21/2018  5:57 PM  Vitals shown include unvalidated device data.  Last Pain: There were no vitals filed for this visit.       Complications: No apparent anesthesia complications

## 2018-12-22 ENCOUNTER — Inpatient Hospital Stay (HOSPITAL_COMMUNITY): Payer: Medicare HMO

## 2018-12-22 DIAGNOSIS — I639 Cerebral infarction, unspecified: Secondary | ICD-10-CM

## 2018-12-22 DIAGNOSIS — I6602 Occlusion and stenosis of left middle cerebral artery: Secondary | ICD-10-CM

## 2018-12-22 DIAGNOSIS — I513 Intracardiac thrombosis, not elsewhere classified: Secondary | ICD-10-CM

## 2018-12-22 DIAGNOSIS — I255 Ischemic cardiomyopathy: Secondary | ICD-10-CM

## 2018-12-22 LAB — CBC WITH DIFFERENTIAL/PLATELET
Abs Immature Granulocytes: 0.04 10*3/uL (ref 0.00–0.07)
Basophils Absolute: 0 10*3/uL (ref 0.0–0.1)
Basophils Relative: 0 %
Eosinophils Absolute: 0 10*3/uL (ref 0.0–0.5)
Eosinophils Relative: 0 %
HCT: 40.6 % (ref 39.0–52.0)
Hemoglobin: 13.5 g/dL (ref 13.0–17.0)
Immature Granulocytes: 0 %
Lymphocytes Relative: 12 %
Lymphs Abs: 1.6 10*3/uL (ref 0.7–4.0)
MCH: 28.5 pg (ref 26.0–34.0)
MCHC: 33.3 g/dL (ref 30.0–36.0)
MCV: 85.7 fL (ref 80.0–100.0)
Monocytes Absolute: 0.9 10*3/uL (ref 0.1–1.0)
Monocytes Relative: 7 %
Neutro Abs: 10.3 10*3/uL — ABNORMAL HIGH (ref 1.7–7.7)
Neutrophils Relative %: 81 %
Platelets: 282 10*3/uL (ref 150–400)
RBC: 4.74 MIL/uL (ref 4.22–5.81)
RDW: 13.1 % (ref 11.5–15.5)
WBC: 12.9 10*3/uL — ABNORMAL HIGH (ref 4.0–10.5)
nRBC: 0 % (ref 0.0–0.2)

## 2018-12-22 LAB — LIPID PANEL
Cholesterol: 165 mg/dL (ref 0–200)
HDL: 34 mg/dL — ABNORMAL LOW (ref >40)
LDL Cholesterol: 105 mg/dL — ABNORMAL HIGH (ref 0–99)
Total CHOL/HDL Ratio: 4.9 RATIO
Triglycerides: 131 mg/dL (ref <150)
VLDL: 26 mg/dL (ref 0–40)

## 2018-12-22 LAB — HEPATIC FUNCTION PANEL
ALT: 13 U/L (ref 0–44)
AST: 16 U/L (ref 15–41)
Albumin: 3.3 g/dL — ABNORMAL LOW (ref 3.5–5.0)
Alkaline Phosphatase: 62 U/L (ref 38–126)
Bilirubin, Direct: 0.1 mg/dL (ref 0.0–0.2)
Indirect Bilirubin: 0.6 mg/dL (ref 0.3–0.9)
Total Bilirubin: 0.7 mg/dL (ref 0.3–1.2)
Total Protein: 6.3 g/dL — ABNORMAL LOW (ref 6.5–8.1)

## 2018-12-22 LAB — POCT I-STAT 7, (LYTES, BLD GAS, ICA,H+H)
Acid-base deficit: 3 mmol/L — ABNORMAL HIGH (ref 0.0–2.0)
Bicarbonate: 20.5 mmol/L (ref 20.0–28.0)
Calcium, Ion: 1.19 mmol/L (ref 1.15–1.40)
HCT: 37 % — ABNORMAL LOW (ref 39.0–52.0)
Hemoglobin: 12.6 g/dL — ABNORMAL LOW (ref 13.0–17.0)
O2 Saturation: 97 %
Patient temperature: 98.6
Potassium: 3.5 mmol/L (ref 3.5–5.1)
Sodium: 141 mmol/L (ref 135–145)
TCO2: 21 mmol/L — ABNORMAL LOW (ref 22–32)
pCO2 arterial: 29.8 mmHg — ABNORMAL LOW (ref 32.0–48.0)
pH, Arterial: 7.445 (ref 7.350–7.450)
pO2, Arterial: 89 mmHg (ref 83.0–108.0)

## 2018-12-22 LAB — BASIC METABOLIC PANEL
Anion gap: 13 (ref 5–15)
BUN: 12 mg/dL (ref 8–23)
CO2: 20 mmol/L — ABNORMAL LOW (ref 22–32)
Calcium: 8.1 mg/dL — ABNORMAL LOW (ref 8.9–10.3)
Chloride: 106 mmol/L (ref 98–111)
Creatinine, Ser: 0.82 mg/dL (ref 0.61–1.24)
GFR calc Af Amer: >60 mL/min (ref >60)
GFR calc non Af Amer: >60 mL/min (ref >60)
Glucose, Bld: 93 mg/dL (ref 70–99)
Potassium: 3.4 mmol/L — ABNORMAL LOW (ref 3.5–5.1)
Sodium: 139 mmol/L (ref 135–145)

## 2018-12-22 LAB — ECHOCARDIOGRAM LIMITED
Height: 72 in
Weight: 3918.9 oz

## 2018-12-22 LAB — HEPARIN LEVEL (UNFRACTIONATED): Heparin Unfractionated: 0.26 IU/mL — ABNORMAL LOW (ref 0.30–0.70)

## 2018-12-22 LAB — GLUCOSE, CAPILLARY
Glucose-Capillary: 96 mg/dL (ref 70–99)
Glucose-Capillary: 84 mg/dL (ref 70–99)
Glucose-Capillary: 80 mg/dL (ref 70–99)
Glucose-Capillary: 83 mg/dL (ref 70–99)
Glucose-Capillary: 118 mg/dL — ABNORMAL HIGH (ref 70–99)
Glucose-Capillary: 93 mg/dL (ref 70–99)

## 2018-12-22 LAB — MAGNESIUM: Magnesium: 1.8 mg/dL (ref 1.7–2.4)

## 2018-12-22 LAB — HEMOGLOBIN A1C
Hgb A1c MFr Bld: 5.7 % — ABNORMAL HIGH (ref 4.8–5.6)
Mean Plasma Glucose: 116.89 mg/dL

## 2018-12-22 LAB — TRIGLYCERIDES: Triglycerides: 154 mg/dL — ABNORMAL HIGH (ref <150)

## 2018-12-22 LAB — HIV ANTIBODY (ROUTINE TESTING W REFLEX): HIV Screen 4th Generation wRfx: NONREACTIVE

## 2018-12-22 LAB — PHOSPHORUS: Phosphorus: 2.7 mg/dL (ref 2.5–4.6)

## 2018-12-22 LAB — TROPONIN I: Troponin I: <0.03 ng/mL (ref <0.03)

## 2018-12-22 SURGERY — Surgical Case
Anesthesia: *Unknown

## 2018-12-22 MED ORDER — PERFLUTREN LIPID MICROSPHERE
INTRAVENOUS | Status: AC
  Administered 2018-12-22: 4 mL via INTRAVENOUS
  Filled 2018-12-22: qty 10

## 2018-12-22 MED ORDER — PANTOPRAZOLE SODIUM 40 MG PO TBEC
40.0000 mg | DELAYED_RELEASE_TABLET | Freq: Every day | ORAL | Status: DC
  Administered 2018-12-22 – 2018-12-27 (×6): 40 mg via ORAL
  Filled 2018-12-22 (×6): qty 1

## 2018-12-22 MED ORDER — HEPARIN (PORCINE) 25000 UT/250ML-% IV SOLN
1550.0000 [IU]/h | INTRAVENOUS | Status: DC
  Administered 2018-12-22: 1400 [IU]/h via INTRAVENOUS
  Administered 2018-12-23 (×2): 1550 [IU]/h via INTRAVENOUS
  Filled 2018-12-22 (×3): qty 250

## 2018-12-22 NOTE — Progress Notes (Signed)
RT NOTE:  Pt transported to MRI and back without event.  

## 2018-12-22 NOTE — Progress Notes (Signed)
PT Cancellation Note  Patient Details Name: Corey Meyers MRN: [id number] DOB: October 06, 1950   Cancelled Treatment:    Reason Eval/Treat Not Completed: Patient not medically ready(remains intubated and sedated at this time)   Fabio Asa 12/22/2018, 7:10 AM Charlotte Crumb, PT DPT  Board Certified Neurologic Specialist Acute Rehabilitation Services Pager [phone] Office [phone]

## 2018-12-22 NOTE — Evaluation (Signed)
Occupational Therapy Evaluation Patient Details Name: Corey Meyers MRN: [id number] DOB: 1951-08-22 Today's Date: 12/22/2018    History of Present Illness 68 y/o male with h/o CAD s/p previous NSTEMIs with stents to RCA and LAD in 2014 & 2015, ischemic CM EF 35% by echo in 2015, HTN, HL and obesity. 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 CT of head showing ischemic changes in the left MCA territory consistent with acute/subacute infarction. s/p clot extraction   Clinical Impression   Upon arrival, pt supine in bed and awake. Pt reporting he lives with his ex-wife and was independent. However, unsure of reliability of home information and PLOF due to decreased cognition and aphasia. Pt currently requiring Min-Mod A for UB ADLs, Min A for LB ADLs, and Min A +2 for functional mobility. Pt presenting with decreased cognition, functional use of RUE (dominant hand), balance, strength, and safety. VSS and RN present. Pt would benefit from further acute OT to facilitate safe dc. Recommend dc to CIR for intensive OT to optimize safety, independence with ADLs, and return to PLOF.       Follow Up Recommendations  CIR;Supervision/Assistance - 24 hour    Equipment Recommendations  Other (comment)(Defer to next venue)    Recommendations for Other Services Rehab consult;PT consult;Speech consult     Precautions / Restrictions Precautions Precautions: Fall      Mobility Bed Mobility Overal bed mobility: Needs Assistance Bed Mobility: Supine to Sit     Supine to sit: Min guard     General bed mobility comments: Min Guard A for safety. No physical A needed  Transfers Overall transfer level: Needs assistance Equipment used: None Transfers: Sit to/from Stand Sit to Stand: Min assist         General transfer comment: Min A for balance and to steady    Balance Overall balance assessment: Needs assistance Sitting-balance support: No upper extremity  supported;Feet supported Sitting balance-Leahy Scale: Good     Standing balance support: No upper extremity supported;During functional activity Standing balance-Leahy Scale: Fair                             ADL either performed or assessed with clinical judgement   ADL Overall ADL's : Needs assistance/impaired Eating/Feeding: NPO   Grooming: Oral care;Minimal assistance;Sitting Grooming Details (indicate cue type and reason): Min A for decreased pinch and grasp strength. Mod cues for sequencing and problem solving. Pt dropping tooth brush multiple times demosntrating poor grasp strength and in hand manipulation Upper Body Bathing: Minimal assistance;Sitting   Lower Body Bathing: +2 for safety/equipment;Sit to/from stand;Minimal assistance   Upper Body Dressing : Moderate assistance;Sitting   Lower Body Dressing: Sit to/from stand;Minimal assistance Lower Body Dressing Details (indicate cue type and reason): Min A for posterior lean as pt used figure four method to don socks while sitting at EOB. Pt requiring Min A +2 for standing balance  Toilet Transfer: Minimal assistance;+2 for safety/equipment;Ambulation(simulated to recliner) Statistician Details (indicate cue type and reason): Min A for safety and balance         Functional mobility during ADLs: Minimal assistance;+2 for safety/equipment General ADL Comments: Pt demonstrating expressive aphasia, decreased following commands, poor RUE strength, and decreased balance.      Vision Patient Visual Report: Other (comment)(Pt rpeorting he wears glasses but unable to state when) Additional Comments: Pt tracking throughout visual field. Will need to further asses  Perception     Praxis      Pertinent Vitals/Pain Pain Assessment: Faces Faces Pain Scale: No hurt Pain Intervention(s): Monitored during session     Hand Dominance Right   Extremity/Trunk Assessment Upper Extremity Assessment Upper  Extremity Assessment: RUE deficits/detail RUE Deficits / Details: Decreased grasp and pinch strength. Poor FM cooridination. Pt with difficulty following cues to complete MMT RUE Coordination: decreased fine motor;decreased gross motor   Lower Extremity Assessment Lower Extremity Assessment: Defer to PT evaluation RLE Coordination: decreased fine motor;decreased gross motor   Cervical / Trunk Assessment Cervical / Trunk Assessment: Normal   Communication Communication Communication: Expressive difficulties   Cognition Arousal/Alertness: Awake/alert Behavior During Therapy: Flat affect Overall Cognitive Status: Impaired/Different from baseline Area of Impairment: Orientation;Attention;Memory;Following commands;Safety/judgement;Awareness;Problem solving                   Current Attention Level: Sustained   Following Commands: Follows one step commands inconsistently;Follows one step commands with increased time(responds well to visual cues over verbal) Safety/Judgement: Decreased awareness of deficits;Decreased awareness of safety Awareness: Intellectual Problem Solving: Slow processing;Decreased initiation;Difficulty sequencing;Requires verbal cues;Requires tactile cues General Comments: patient with limited communication, noted significant delayed response upwards of ~10 seconds at times. Requiring increased time and benefits from visual cues.    General Comments  VSS throughout. SpO2 90s on RA.     Exercises     Shoulder Instructions      Home Living Family/patient expects to be discharged to:: Inpatient rehab                                 Additional Comments: Pt reporting he lives with his ex-wife. Unsure of reliability due to decreased congition and poor historian      Prior Functioning/Environment Level of Independence: Independent                 OT Problem List: Decreased strength;Decreased range of motion;Decreased activity  tolerance;Impaired balance (sitting and/or standing);Decreased safety awareness;Decreased knowledge of precautions;Decreased knowledge of use of DME or AE;Decreased cognition;Decreased coordination;Impaired UE functional use      OT Treatment/Interventions: Self-care/ADL training;Therapeutic exercise;Energy conservation;DME and/or AE instruction;Therapeutic activities;Patient/family education;Balance training    OT Goals(Current goals can be found in the care plan section) Acute Rehab OT Goals Patient Stated Goal: Unstated OT Goal Formulation: Patient unable to participate in goal setting Time For Goal Achievement: 01/05/19 Potential to Achieve Goals: Good  OT Frequency: Min 3X/week   Barriers to D/C:            Co-evaluation PT/OT/SLP Co-Evaluation/Treatment: Yes Reason for Co-Treatment: Necessary to address cognition/behavior during functional activity;To address functional/ADL transfers;For patient/therapist safety PT goals addressed during session: Mobility/safety with mobility;Balance OT goals addressed during session: ADL's and self-care      AM-PAC OT "6 Clicks" Daily Activity     Outcome Measure Help from another person eating meals?: Total Help from another person taking care of personal grooming?: A Little Help from another person toileting, which includes using toliet, bedpan, or urinal?: A Little Help from another person bathing (including washing, rinsing, drying)?: A Little Help from another person to put on and taking off regular upper body clothing?: A Lot Help from another person to put on and taking off regular lower body clothing?: A Little 6 Click Score: 15   End of Session Equipment Utilized During Treatment: Gait belt;Oxygen Nurse Communication: Mobility status  Activity Tolerance: Patient tolerated treatment well  Patient left: in chair;with call bell/phone within reach;with chair alarm set  OT Visit Diagnosis: Unsteadiness on feet (R26.81);Other  abnormalities of gait and mobility (R26.89);Muscle weakness (generalized) (M62.81);Other symptoms and signs involving cognitive function;Hemiplegia and hemiparesis Hemiplegia - Right/Left: Right Hemiplegia - dominant/non-dominant: Dominant Hemiplegia - caused by: Cerebral infarction                Time: 4782-9562 OT Time Calculation (min): 21 min Charges:  OT General Charges $OT Visit: 1 Visit OT Evaluation $OT Eval Moderate Complexity: 1 Mod  Dylann Layne MSOT, OTR/L Acute Rehab Pager: [phone] Office: [phone]  Theodoro Grist Cale Bethard 12/22/2018, 2:45 PM

## 2018-12-22 NOTE — Consult Note (Signed)
Cardiology Consult Note   Primary Physician: Raynelle Bring PCP-Cardiologist:  Dr. Darrold Junker  Reason for Consultation: LV thrombus  HPI:    Corey Meyers is seen today for evaluation of  LV thrombus at the request of  Dr. Rande Lawman  68 y/o male with h/o CAD s/p previous NSTEMIs with stents to RCA and LAD in 2014 & 2015, ischemic CM EF 35% by echo in 2015, HTN, HL and obesity. 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 Remains aphasic. Shakes his head no to recent CP or HF symptoms.      Review of Systems:  As above. Unable to obtain full ROS due to expressive aphasia  Home Medications Prior to Admission medications   Medication Sig Start Date End Date Taking? Authorizing Provider  aspirin EC 81 MG tablet Take 81 mg by mouth daily. 02/16/14  Yes [provider]  atorvastatin (LIPITOR) 80 MG tablet Take 80 mg by mouth daily. 11/07/18  Yes [provider]  carvedilol (COREG) 3.125 MG tablet Take 3.125 mg by mouth 2 (two) times daily. 11/07/18  Yes [provider]  nitroGLYCERIN (NITROSTAT) 0.4 MG SL tablet Place 0.4 mg under the tongue every 5 (five) minutes as needed for chest pain.  01/03/17  Yes [provider]  ticagrelor (BRILINTA) 90 MG TABS tablet Take 90 mg by mouth 2 (two) times daily.   Yes [provider]    Past Medical History: Past Medical History:  Diagnosis Date   Hyperlipidemia    Hypertension    Stroke [facility])     Past Surgical History: Past Surgical History:  Procedure Laterality Date   LAMINECTOMY     TONSILLECTOMY      Family History: No family history on file.  Social History: Social History   Socioeconomic History   Marital status: Married    Spouse name: Not on file   Number of children: Not on file   Years of education: Not on file   Highest education level: Not on file  Occupational History   Not on file    Social Needs   Financial resource strain: Not on file   Food insecurity:    Worry: Not on file    Inability: Not on file   Transportation needs:    Medical: Not on file    Non-medical: Not on file  Tobacco Use   Smoking status: Former Smoker   Smokeless tobacco: Never Used  Substance and Sexual Activity   Alcohol use: Yes   Drug use: Not on file   Sexual activity: Not on file  Lifestyle   Physical activity:    Days per week: Not on file    Minutes per session: Not on file   Stress: Not on file  Relationships   Social connections:    Talks on phone: Not on file    Gets together: Not on file    Attends religious service: Not on file    Active member of club or organization: Not on file    Attends meetings of clubs or organizations: Not on file    Relationship status: Not on file  Other Topics Concern   Not on file  Social History Narrative   Not on file    Allergies:  No Known Allergies  Objective:    Vital Signs:   Temp:  [97.4 F (36.3 C)-98.9 F (37.2 C)] 98.4 F (36.9 C) (04/04 1200) Pulse Rate:  [  69-108] 90 (04/04 1100) Resp:  [0-26] 14 (04/04 1100) BP: (90-142)/(55-101) 123/81 (04/04 1100) SpO2:  [96 %-100 %] 100 % (04/04 1100) Arterial Line BP: (80-302)/(61-298) 103/73 (04/04 0930) FiO2 (%):  [40 %-100 %] 40 % (04/04 0800) Weight:  [111.1 kg] 111.1 kg (04/03 1450) Last BM Date: (PTA)  Weight change: Filed Weights   12/21/18 1450  Weight: 111.1 kg    Intake/Output:   Intake/Output Summary (Last 24 hours) at 12/22/2018 1342 Last data filed at 12/22/2018 1100 Gross per 24 hour  Intake 2323.02 ml  Output 1075 ml  Net 1248.02 ml      Physical Exam    General:  Lying in bed  No resp difficulty HEENT: normal Neck: supple. JVP 6-7 . Carotids 2+ bilat; no bruits. No lymphadenopathy or thyromegaly appreciated. Cor: PMI nondisplaced. Regular rate & rhythm. No rubs, gallops or murmurs. Lungs: clear Abdomen: soft, nontender,  nondistended. No hepatosplenomegaly. No bruits or masses. Good bowel sounds. Extremities: no cyanosis, clubbing, rash, edema Neuro: alert follows commands. Weak on R. +expressive aphasia  Telemetry   NSR 90s Personally reviewed   EKG    NSR 79 anterior Q waves. Personally reviewed   Labs   Basic Metabolic Panel: Recent Labs  Lab 12/21/18 1401 12/21/18 1408 12/21/18 1757 12/22/18 0429 12/22/18 1125  NA 138  --  140 139 141  K 4.0  --  3.7 3.4* 3.5  CL 105  --   --  106  --   CO2 22  --   --  20*  --   GLUCOSE 87  --   --  93  --   BUN 13  --   --  12  --   CREATININE 0.97 0.90  --  0.82  --   CALCIUM 8.6*  --   --  8.1*  --   MG  --   --   --  1.8  --   PHOS  --   --   --  2.7  --     Liver Function Tests: Recent Labs  Lab 12/21/18 1401 12/22/18 0429  AST 20 16  ALT 14 13  ALKPHOS 68 62  BILITOT 0.7 0.7  PROT 7.1 6.3*  ALBUMIN 3.7 3.3*   No results for input(s): LIPASE, AMYLASE in the last 168 hours. No results for input(s): AMMONIA in the last 168 hours.  CBC: Recent Labs  Lab 12/21/18 1401 12/21/18 1757 12/22/18 0429 12/22/18 1125  WBC 7.5  --  12.9*  --   NEUTROABS 4.1  --  10.3*  --   HGB 14.7 12.9* 13.5 12.6*  HCT 42.7 38.0* 40.6 37.0*  MCV 86.3  --  85.7  --   PLT 322  --  282  --     Cardiac Enzymes: Recent Labs  Lab 12/22/18 0429  TROPONINI <0.03    BNP: BNP (last 3 results) No results for input(s): BNP in the last 8760 hours.  ProBNP (last 3 results) No results for input(s): PROBNP in the last 8760 hours.   CBG: Recent Labs  Lab 12/21/18 1803 12/21/18 1945 12/22/18 0318 12/22/18 0805 12/22/18 1209  GLUCAP 85 76 96 84 80    Coagulation Studies: Recent Labs    12/21/18 1401  LABPROT 13.6  INR 1.1     Imaging   Ct Angio Head W Or Wo Contrast  Result Date: 12/21/2018 CLINICAL DATA:  Acute presentation with speech disturbance and right-sided weakness. EXAM: CT ANGIOGRAPHY HEAD AND NECK CT  PERFUSION BRAIN  TECHNIQUE: Multidetector CT imaging of the head and neck was performed using the standard protocol during bolus administration of intravenous contrast. Multiplanar CT image reconstructions and MIPs were obtained to evaluate the vascular anatomy. Carotid stenosis measurements (when applicable) are obtained utilizing NASCET criteria, using the distal internal carotid diameter as the denominator. Multiphase CT imaging of the brain was performed following IV bolus contrast injection. Subsequent parametric perfusion maps were calculated using RAPID software. CONTRAST:  OMNIPAQUE IOHEXOL 350 MG/ML SOLN COMPARISON:  Head CT earlier same day FINDINGS: CTA NECK FINDINGS Aortic arch: Minimal atherosclerosis of the arch. No aneurysm or dissection. Branching pattern is normal, with bovine origin pattern. Right carotid system: Common carotid artery is widely patent to the bifurcation. No atherosclerotic plaque at the bifurcation. Minimal soft plaque of the ICA bulb but no stenosis. Cervical ICA is widely patent. Left carotid system: Common carotid artery widely patent to the bifurcation. No plaque at the carotid bifurcation or ICA bulb. Cervical ICA is widely patent. Vertebral arteries: Both vertebral arteries are widely patent at their origins and through the cervical region to the foramen magnum. Skeleton: Mid cervical spondylosis. Other neck: No mass or lymphadenopathy. Upper chest: Negative Review of the MIP images confirms the above findings CTA HEAD FINDINGS Anterior circulation: On the right, the carotid siphon shows minimal atherosclerotic calcification but no stenosis. The anterior and middle cerebral vessels are patent. No sign of embolic occlusion on the right. On the left, there is mild atherosclerotic calcification of the siphon but no stenosis. The anterior and middle cerebral vessels are patent. There is an occluded left M2 branch with some distal reconstitution. Posterior circulation: Both vertebral  arteries are widely patent to the basilar. No basilar stenosis. Posterior circulation branch vessels are normal. Venous sinuses: Patent and normal. Anatomic variants: None significant. Delayed phase: No abnormal enhancement. Review of the MIP images confirms the above findings CT Brain Perfusion Findings: CBF (<30%) Volume: 0mL Perfusion (Tmax>6.0s) volume: 34mL Mismatch Volume: 34mL Infarction Location:No infarction. Area of delayed perfusion is in the left posterior frontal and parietal cortical and subcortical brain. M 5 and M 6 aspects territory. IMPRESSION: Embolic occlusion of a left M2 branch. T-max greater than 6 seconds in the left posterior frontal and parietal cortical and subcortical brain, without evidence of actual completed infarction by perfusion imaging. The findings in the basal ganglia and insula must relate to an old insult. I am somewhat surprised at the well visible low-density in the area of penumbra. However, given all this data, patient would seem to be a candidate for intervention. These results were called by telephone at the time of interpretation on 12/21/2018 at 2:40 pm to Dr. Otelia Limes, who verbally acknowledged these results. Electronically Signed   By: Paulina Fusi M.D.   On: 12/21/2018 14:45   Ct Angio Neck W Or Wo Contrast  Result Date: 12/21/2018 CLINICAL DATA:  Acute presentation with speech disturbance and right-sided weakness. EXAM: CT ANGIOGRAPHY HEAD AND NECK CT PERFUSION BRAIN TECHNIQUE: Multidetector CT imaging of the head and neck was performed using the standard protocol during bolus administration of intravenous contrast. Multiplanar CT image reconstructions and MIPs were obtained to evaluate the vascular anatomy. Carotid stenosis measurements (when applicable) are obtained utilizing NASCET criteria, using the distal internal carotid diameter as the denominator. Multiphase CT imaging of the brain was performed following IV bolus contrast injection. Subsequent parametric  perfusion maps were calculated using RAPID software. CONTRAST:  OMNIPAQUE IOHEXOL 350 MG/ML SOLN COMPARISON:  Head CT earlier same day FINDINGS: CTA NECK FINDINGS Aortic arch: Minimal atherosclerosis of the arch. No aneurysm or dissection. Branching pattern is normal, with bovine origin pattern. Right carotid system: Common carotid artery is widely patent to the bifurcation. No atherosclerotic plaque at the bifurcation. Minimal soft plaque of the ICA bulb but no stenosis. Cervical ICA is widely patent. Left carotid system: Common carotid artery widely patent to the bifurcation. No plaque at the carotid bifurcation or ICA bulb. Cervical ICA is widely patent. Vertebral arteries: Both vertebral arteries are widely patent at their origins and through the cervical region to the foramen magnum. Skeleton: Mid cervical spondylosis. Other neck: No mass or lymphadenopathy. Upper chest: Negative Review of the MIP images confirms the above findings CTA HEAD FINDINGS Anterior circulation: On the right, the carotid siphon shows minimal atherosclerotic calcification but no stenosis. The anterior and middle cerebral vessels are patent. No sign of embolic occlusion on the right. On the left, there is mild atherosclerotic calcification of the siphon but no stenosis. The anterior and middle cerebral vessels are patent. There is an occluded left M2 branch with some distal reconstitution. Posterior circulation: Both vertebral arteries are widely patent to the basilar. No basilar stenosis. Posterior circulation branch vessels are normal. Venous sinuses: Patent and normal. Anatomic variants: None significant. Delayed phase: No abnormal enhancement. Review of the MIP images confirms the above findings CT Brain Perfusion Findings: CBF (<30%) Volume: 64mL Perfusion (Tmax>6.0s) volume: 79mL Mismatch Volume: 86mL Infarction Location:No infarction. Area of delayed perfusion is in the left posterior frontal and parietal cortical and  subcortical brain. M 5 and M 6 aspects territory. IMPRESSION: Embolic occlusion of a left M2 branch. T-max greater than 6 seconds in the left posterior frontal and parietal cortical and subcortical brain, without evidence of actual completed infarction by perfusion imaging. The findings in the basal ganglia and insula must relate to an old insult. I am somewhat surprised at the well visible low-density in the area of penumbra. However, given all this data, patient would seem to be a candidate for intervention. These results were called by telephone at the time of interpretation on 12/21/2018 at 2:40 pm to Dr. Otelia Limes, who verbally acknowledged these results. Electronically Signed   By: Paulina Fusi M.D.   On: 12/21/2018 14:45   Mr Brain Wo Contrast  Result Date: 12/22/2018 CLINICAL DATA:  Stroke follow-up EXAM: MRI HEAD WITHOUT CONTRAST TECHNIQUE: Multiplanar, multiecho pulse sequences of the brain and surrounding structures were obtained without intravenous contrast. COMPARISON:  CTA head neck 12/21/2018 FINDINGS: BRAIN: Intermediate sized area of acute ischemia within the left MCA territory, predominantly involving the parietal cortex and posterior left insula, as well as the left basal ganglia. There is a very small area of ischemia in the left ACA territory. The midline structures are normal. No midline shift or other mass effect. Moderate edema at the ischemic sites. White-matter otherwise normal for age. The cerebral and cerebellar volume are age-appropriate. No hydrocephalus. Small focus of magnetic susceptibility effects in the affected left MCA territory may indicate a small fragment thrombus. No hemorrhage. No mass lesion. VASCULAR: The major intracranial arterial and venous sinus flow voids are normal. SKULL AND UPPER CERVICAL SPINE: Calvarial bone marrow signal is normal. There is no skull base mass. Visualized upper cervical spine and soft tissues are normal. SINUSES/ORBITS: Fluid levels in the  sphenoid sinuses. The orbits are normal. IMPRESSION: 1. Intermediate volume left MCA territory acute infarct without hemorrhage or significant mass effect. 2. Punctate foci  of acute ischemia within the left anterior cerebral artery territory. Electronically Signed   By: Deatra Robinson M.D.   On: 12/22/2018 01:59   Ct Cerebral Perfusion W Contrast  Result Date: 12/21/2018 CLINICAL DATA:  Acute presentation with speech disturbance and right-sided weakness. EXAM: CT ANGIOGRAPHY HEAD AND NECK CT PERFUSION BRAIN TECHNIQUE: Multidetector CT imaging of the head and neck was performed using the standard protocol during bolus administration of intravenous contrast. Multiplanar CT image reconstructions and MIPs were obtained to evaluate the vascular anatomy. Carotid stenosis measurements (when applicable) are obtained utilizing NASCET criteria, using the distal internal carotid diameter as the denominator. Multiphase CT imaging of the brain was performed following IV bolus contrast injection. Subsequent parametric perfusion maps were calculated using RAPID software. CONTRAST:  OMNIPAQUE IOHEXOL 350 MG/ML SOLN COMPARISON:  Head CT earlier same day FINDINGS: CTA NECK FINDINGS Aortic arch: Minimal atherosclerosis of the arch. No aneurysm or dissection. Branching pattern is normal, with bovine origin pattern. Right carotid system: Common carotid artery is widely patent to the bifurcation. No atherosclerotic plaque at the bifurcation. Minimal soft plaque of the ICA bulb but no stenosis. Cervical ICA is widely patent. Left carotid system: Common carotid artery widely patent to the bifurcation. No plaque at the carotid bifurcation or ICA bulb. Cervical ICA is widely patent. Vertebral arteries: Both vertebral arteries are widely patent at their origins and through the cervical region to the foramen magnum. Skeleton: Mid cervical spondylosis. Other neck: No mass or lymphadenopathy. Upper chest: Negative Review of the MIP  images confirms the above findings CTA HEAD FINDINGS Anterior circulation: On the right, the carotid siphon shows minimal atherosclerotic calcification but no stenosis. The anterior and middle cerebral vessels are patent. No sign of embolic occlusion on the right. On the left, there is mild atherosclerotic calcification of the siphon but no stenosis. The anterior and middle cerebral vessels are patent. There is an occluded left M2 branch with some distal reconstitution. Posterior circulation: Both vertebral arteries are widely patent to the basilar. No basilar stenosis. Posterior circulation branch vessels are normal. Venous sinuses: Patent and normal. Anatomic variants: None significant. Delayed phase: No abnormal enhancement. Review of the MIP images confirms the above findings CT Brain Perfusion Findings: CBF (<30%) Volume: 0mL Perfusion (Tmax>6.0s) volume: 34mL Mismatch Volume: 34mL Infarction Location:No infarction. Area of delayed perfusion is in the left posterior frontal and parietal cortical and subcortical brain. M 5 and M 6 aspects territory. IMPRESSION: Embolic occlusion of a left M2 branch. T-max greater than 6 seconds in the left posterior frontal and parietal cortical and subcortical brain, without evidence of actual completed infarction by perfusion imaging. The findings in the basal ganglia and insula must relate to an old insult. I am somewhat surprised at the well visible low-density in the area of penumbra. However, given all this data, patient would seem to be a candidate for intervention. These results were called by telephone at the time of interpretation on 12/21/2018 at 2:40 pm to Dr. Otelia Limes, who verbally acknowledged these results. Electronically Signed   By: Paulina Fusi M.D.   On: 12/21/2018 14:45   Dg Chest Port 1 View  Result Date: 12/22/2018 CLINICAL DATA:  Endotracheal tube. EXAM: PORTABLE CHEST 1 VIEW COMPARISON:  Yesterday FINDINGS: Endotracheal tube tip at the clavicular heads.  Cardiomegaly and vascular pedicle widening. Coronary stent is present. Low lung volumes. There is no edema, consolidation, effusion, or pneumothorax. IMPRESSION: Stable low volume chest.  Stable endotracheal tube positioning. Electronically Signed  By: Marnee SpringJonathon  Watts M.D.   On: 12/22/2018 08:07   Dg Chest Port 1 View  Result Date: 12/21/2018 CLINICAL DATA:  Altered mental status EXAM: PORTABLE CHEST 1 VIEW COMPARISON:  None. FINDINGS: There is hazy left lower lobe airspace disease which may reflect atelectasis versus pneumonia. There is no pleural effusion or pneumothorax. The heart and mediastinal contours are unremarkable. The osseous structures are unremarkable. IMPRESSION: 1. Hazy left lower lobe airspace disease which may reflect atelectasis versus pneumonia. Electronically Signed   By: Elige KoHetal  Patel   On: 12/21/2018 18:57   Ct Head Code Stroke Wo Contrast  Result Date: 12/21/2018 CLINICAL DATA:  Code stroke. Speech disturbance. Right-sided weakness. EXAM: CT HEAD WITHOUT CONTRAST TECHNIQUE: Contiguous axial images were obtained from the base of the skull through the vertex without intravenous contrast. COMPARISON:  None. FINDINGS: Brain: Evidence of acute/subacute ischemic change with loss of gray-white differentiation affecting the deep insula, M 5 and M 6 regions. Mild swelling. No hemorrhage. Probable involvement also of the corpus striated item on the left as well as the posterior limb internal capsule. No acute finding on the right. No evidence of definite pre-existing infarction. It is possible that some of the left basal ganglia changes could be old. No hydrocephalus. No extra-axial collection. Vascular: 1 could question slight asymmetric hyperdensity of the left middle cerebral artery. Skull: Negative Sinuses/Orbits: Ordinary mild seasonal mucosal inflammatory change. Orbits negative. Other: None ASPECTS ([place] Stroke Program Early CT Score) - Ganglionic level infarction (caudate, lentiform  nuclei, internal capsule, insula, M1-M3 cortex): 3 - Supraganglionic infarction (M4-M6 cortex): 1 Total score (0-10 with 10 being normal): 4 IMPRESSION: 1. Ischemic changes in the left MCA territory consistent with acute/subacute infarction. Involvement of the left caudate, lentiform nuclei, deep insula, internal capsule, M 5 and M 6 regions. No sign of hemorrhage or mass effect. 2. ASPECTS is 4 3. These results were communicated to Dr. Otelia LimesLindzen at 2:21 pmon 4/3/2020by text page via the [facility] messaging system. Electronically Signed   By: Paulina FusiMark  Shogry M.D.   On: 12/21/2018 14:24     Medications:     Current Medications:   stroke: mapping our early stages of recovery book   Does not apply Once   atorvastatin  80 mg Oral q1800   carvedilol  3.125 mg Oral BID WC   insulin aspart  2-6 Units Subcutaneous Q4H   pantoprazole (PROTONIX) IV  40 mg Intravenous Q24H   sodium chloride flush  3 mL Intravenous Once    Infusions:  sodium chloride 75 mL/hr at 12/22/18 0858   clevidipine 4 mg/hr (12/22/18 0709)   heparin     propofol (DIPRIVAN) infusion 10 mcg/kg/min (12/22/18 0740)      Patient Profile   68 y/o male with h/o CAD s/p previous NSTEMIs with stents to RCA and LAD in 2014 & 2015, ischemic CM EF 35% by echo in 2015, HTN, HL and obesity. 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 Echo with EF 35-40% with apical clot.   Assessment/Plan   1. Acute thrombotic CVA - s/p clot extraction - likely due to cardio-embolic phenomenon - echo with apical LV thrombus - will need long-term AC. Timing per Neurology (heparin to start soon). Stents are remote so can stop Brilinta   2. Ischemic CM with LV thrombus - Echo 12/22/18 reviewed personally. EF 35-40% + LV clot - Volume status ok currently - Continue b-blocker. Would stop IVF. - has not been on  ACE/ARB/ARNI or spiro. Will start later this admit as he recovers and BP increases.     3. CAD - h/o Non-STEMI, Promus stent proximal and mid LAD 10/06/2012 - h/o  Non-STEMI, resolute stent right PLA, right PDA and mid LAD 02/14/2014 - No current s/s ischemia - continue statin & b-blocker - AC per Neurology at this point. Can stop Brilinta with need for AC  4. Hypokalemia - will supp  We will see again Monday. Please call with questions.  Length of Stay: 1  Arvilla Meres, MD  12/22/2018, 1:42 PM  Advanced Heart Failure Team Pager [phone] (M-F; 7a - 4p)  Please contact [facility] for night-coverage after hours (4p -7a ) and weekends on [email or web address]

## 2018-12-22 NOTE — Procedures (Signed)
Extubation Procedure Note  Patient Details:   Name: Corey Meyers DOB: 09-12-1951 MRN: [id number]   Airway Documentation:    Vent end date: 12/22/18 Vent end time: 1135   Evaluation  O2 sats: stable throughout Complications: No apparent complications Patient did tolerate procedure well. Bilateral Breath Sounds: Diminished   Yes   Pt extubated per MD order.  PT is on 4L [place] with sats of 100%.  RT will continue to monitor.  [name] 12/22/2018, 11:43 AM

## 2018-12-22 NOTE — Progress Notes (Signed)
OT Cancellation Note  Patient Details Name: [name] MRN: [id number] DOB: 1951-06-23   Cancelled Treatment:    Reason Eval/Treat Not Completed: Active bedrest order;Patient not medically ready (Intubated. Sedated. Will return as schedule allows. Thank you.)  Jeramiah Mccaughey M Kaylynne Andres Dontell Mian MSOT, OTR/L Acute Rehab Pager: [phone] Office: [phone] 12/22/2018, 7:11 AM

## 2018-12-22 NOTE — Progress Notes (Signed)
Rehab Admissions Coordinator Note:  Patient was screened by Trish Mage for appropriateness for an Inpatient Acute Rehab Consult.  At this time, we are recommending Inpatient Rehab consult.  Trish Mage 12/22/2018, 5:32 PM  I can be reached at [phone].

## 2018-12-22 NOTE — Progress Notes (Signed)
STROKE TEAM PROGRESS NOTE   HISTORY OF PRESENT ILLNESS (per record) Corey Meyers is a 68 y.o. male with PMhx significant for HTN, dyslipidemia and CAD with stents in right PDA and mid LAD brought to ED via EMS when found altered and unresponsive by wife around 1 PM. Per EMS patient called the wife around 7:30 PM last night and sounds normal, when wife came home today around 1 PM she found him altered and minimally responsive so she called the EMS.  Per EMS patient was mostly aphasic except for a few mumbles, overall weak more pronounced on right and not following commands.  Per EMS no recent illness or sick contacts. The patient called his wife at 12:30 PM today and his speech was garbled and unintelligible. She thought that maybe he was trying to call her because he knew something was wrong. EMS was called.  Pertinent risk factors for stroke of hypertension, dyslipidemia and coronary artery disease. Home medications: ASA 81 mg qd Atorvastatin 80 mg qd Brilinta 90 mg BID Carvedilol 3.125 mg BID LKW: 7:30 PM 0n 12/20/18 tpa given?: no, out of window Premorbid modified Rankin scale (mRS): 0   Interventional Radiology - Cerebral Angiogram with Intervention - Dr Corliss Skains S/P lt common carotid arteriogram followed by complete revascularization of occluded M 2  Branch of  Lt MCA inf division  Achieving a TICI 2b revascularization.   SUBJECTIVE (INTERVAL HISTORY) His nurse is at bedside as his echo cardio technician.  Suspicion for left ventricular apical clot was confirmed.  Patient started on IV heparin stroke protocol.  I discussed findings with wife and increased risk of bleeding wife agreed to start heparin.    OBJECTIVE Vitals:   12/22/18 0400 12/22/18 0500 12/22/18 0600 12/22/18 0700  BP: 114/81 (!) 131/101 107/78 124/79  Pulse: 85 97 87 83  Resp: 18 16 18 18   Temp: 98.9 F (37.2 C)     TempSrc: Oral     SpO2: 100% 100% 100% 100%  Weight:      Height:        CBC:  Recent Labs   Lab 12/21/18 1401 12/21/18 1757 12/22/18 0429  WBC 7.5  --  12.9*  NEUTROABS 4.1  --  10.3*  HGB 14.7 12.9* 13.5  HCT 42.7 38.0* 40.6  MCV 86.3  --  85.7  PLT 322  --  282    Basic Metabolic Panel:  Recent Labs  Lab 12/21/18 1401 12/21/18 1408 12/21/18 1757 12/22/18 0429  NA 138  --  140 139  K 4.0  --  3.7 3.4*  CL 105  --   --  106  CO2 22  --   --  20*  GLUCOSE 87  --   --  93  BUN 13  --   --  12  CREATININE 0.97 0.90  --  0.82  CALCIUM 8.6*  --   --  8.1*  MG  --   --   --  1.8  PHOS  --   --   --  2.7    Lipid Panel:     Component Value Date/Time   CHOL 165 12/22/2018 0429   CHOL 134 02/12/2014 0210   TRIG 131 12/22/2018 0429   TRIG 122 02/12/2014 0210   HDL 34 (L) 12/22/2018 0429   HDL 36 (L) 02/12/2014 0210   CHOLHDL 4.9 12/22/2018 0429   VLDL 26 12/22/2018 0429   VLDL 24 02/12/2014 0210   LDLCALC 105 (H) 12/22/2018 0429  LDLCALC 74 02/12/2014 0210   HgbA1c:  Lab Results  Component Value Date   HGBA1C 5.7 (H) 12/22/2018   Urine Drug Screen: No results found for: LABOPIA, COCAINSCRNUR, LABBENZ, AMPHETMU, THCU, LABBARB  Alcohol Level     Component Value Date/Time   ETH <10 12/21/2018 1410    IMAGING  Ct Angio Head W Or Wo Contrast Ct Angio Neck W Or Wo Contrast Ct Cerebral Perfusion W Contrast 12/21/2018 IMPRESSION:  Embolic occlusion of a left M2 branch. T-max greater than 6 seconds in the left posterior frontal and parietal cortical and subcortical brain, without evidence of actual completed infarction by perfusion imaging. The findings in the basal ganglia and insula must relate to an old insult. I am somewhat surprised at the well visible low-density in the area of penumbra. However, given all this data, patient would seem to be a candidate for intervention.    Mr Brain Wo Contrast 12/22/2018 IMPRESSION:  1. Intermediate volume left MCA territory acute infarct without hemorrhage or significant mass effect.  2. Punctate foci of acute  ischemia within the left anterior cerebral artery territory.    Ct Head Code Stroke Wo Contrast 12/21/2018 IMPRESSION:  1. Ischemic changes in the left MCA territory consistent with acute/subacute infarction. Involvement of the left caudate, lentiform nuclei, deep insula, internal capsule, M 5 and M 6 regions. No sign of hemorrhage or mass effect.  2. ASPECTS is 4    Dg Chest Port 1 View 12/21/2018 IMPRESSION:  Hazy left lower lobe airspace disease which may reflect atelectasis versus pneumonia.    Interventional Radiology - Cerebral Angiogram with Intervention - Dr Corliss Skains S/P lt common carotid arteriogram followed by complete revascularization of occluded M 2  Branch of  Lt MCA inf division  Achieving a TICI 2b revascularization.   Transthoracic Echocardiogram  00/00/2020 Pending   EKG - SR rate 79 BPM. (See cardiology reading for complete details)    PHYSICAL EXAM Blood pressure 124/79, pulse 83, temperature 98.9 F (37.2 C), temperature source Oral, resp. rate 18, height 6' (1.829 m), weight 111.1 kg, SpO2 100 %.  Neurologic: Patient is awake and alert, expressive aphasia, perseveration, is able to follow simple commands in all extremities, does not blink to threat on the right, pupils are equally round and reactive, unable to perform for endoscopy due to noncooperation, conjugate midline gaze, corneals intact, face appears symmetric, hearing intact to voice, right hemiparesis, withdraws to stim x4.  Unable to assess coordination and gait.  ASSESSMENT/PLAN Corey Meyers is a 68 y.o. male with history of HTN, dyslipidemia and CAD with stents found minimally responsive, aphasic, except for a few mumbles, overall weak, more pronounced on right and not following commands. He did not receive IV t-PA due to late presentation.  Interventional Radiology - Cerebral Angiogram with Intervention - Dr Corliss Skains S/P lt common carotid arteriogram followed by complete revascularization of  occluded M 2  Branch of  Lt MCA inf division  Achieving a TICI 2b revascularization.   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  Resultant hemiparesis, expressive aphasia  CT head - Ischemic changes in the left MCA territory consistent with acute/subacute infarction.  MRI head -  Intermediate volume left MCA territory acute infarct. Punctate foci of acute ischemia within the left anterior cerebral artery territory.   MRA head - not performed  CTA H&N - Embolic occlusion of a left M2 branch. Basal ganglia and insula  must relate to an old insult.  Carotid Doppler - CTA neck performed - carotid dopplers not indicated.  2D Echo -left ventricular apical thrombus, heparin started  LDL - 105  HgbA1c - 5.7  UDS - pending  VTE prophylaxis - SCDs  Diet - NPO  aspirin 81 mg daily and Brilinta (ticagrelor) 90 mg bid prior to admission, now on no antithrombotic - NPO - Per Dr Otelia Limes - hold Brilinta and asa for 24 hours after intervention.  Now on heparin due to apical thrombus in the left ventricle.  Patient will be counseled to be compliant with his antithrombotic medications  Ongoing aggressive stroke risk factor management  Therapy recommendations:  pending  Disposition:  Pending  Hypertension  Stable (Cleviprex prn) . Permissive hypertension (OK if < 220/120) but gradually normalize in 5-7 days . Long-term BP goal normotensive  Hyperlipidemia  Lipid lowering medication PTA:  Lipitor 80 mg daily  LDL 105, goal < 70  Current lipid lowering medication: Lipitor 80 mg daily  Continue statin at discharge  Other Stroke Risk Factors  Advanced age  Former cigarette smoker - quit  ETOH use, advised to drink no more than 1 alcoholic beverage per day.  Obesity, Body mass index is 33.22 kg/m., recommend weight loss, diet and exercise as appropriate   Hx stroke/TIA - by imaging  Coronary  artery disease   Other Active Problems  Mild hypokalemia - 3.4  Abnormal CXR - Hazy left lower lobe airspace disease which may reflect atelectasis versus pneumonia.   Mild Leukocytosis 12.9 (afebrile)  Intubated - CCM following.   Hospital day # 1  This is an unfortunate gentleman with left hemisphere embolic stroke, he has a history of coronary artery disease, MIs, status post stents, and a left ventricular apical thrombus found which is the likely etiology for his strokes.  Unclear etiology however of the thrombus.  Started on heparin stroke protocol.  I spoke to his wife (ex-wife?)  Today who is his power of attorney and discussed MRI findings and echo findings and risk of bleeding with heparin, she agreed to plan of care above.  This patient is critically ill and at significant risk of neurological worsening, death and care requires constant monitoring of vital signs, hemodynamics,respiratory and cardiac monitoring,review of multiple databases, neurological assessment, discussion with family, other specialists and medical decision making of high complexity.I  I spent 30 minutes of neurocritical care time in the care of this patient.  Naomie Dean, MD [facility]     To contact Stroke Continuity provider, please refer to [email or web address]. After hours, contact General Neurology

## 2018-12-22 NOTE — Anesthesia Postprocedure Evaluation (Signed)
Anesthesia Post Note  Patient: Corey Meyers  Procedure(Meyers) Performed: RADIOLOGY WITH ANESTHESIA (N/A )     Patient location during evaluation: SICU Anesthesia Type: General Level of consciousness: sedated Pain management: pain level controlled Vital Signs Assessment: post-procedure vital signs reviewed and stable Respiratory status: patient remains intubated per anesthesia plan Cardiovascular status: stable Postop Assessment: no apparent nausea or vomiting Anesthetic complications: no    Last Vitals:  Vitals:   12/22/18 1615 12/22/18 1645  BP: 127/83 (!) 121/91  Pulse: 79 93  Resp: 17 20  Temp:    SpO2: 100% 99%    Last Pain:  Vitals:   12/22/18 1600  TempSrc: Oral  PainSc: 0-No pain                 Corey Meyers

## 2018-12-22 NOTE — Progress Notes (Signed)
SLP Cancellation Note  Patient Details Name: Corey Meyers MRN: [id number] DOB: [birth year]   Cancelled treatment:       Reason Eval/Treat Not Completed: Patient not medically ready. Intubated   Corey Meyers, [name] 12/22/2018, 8:19 AM

## 2018-12-22 NOTE — Progress Notes (Signed)
  Echocardiogram 2D Echocardiogram has been performed.    Clot found in apex of left ventricle, will notify cardiology.  Nurse informed as well.  Delcie Roch 12/22/2018, 9:36 AM

## 2018-12-22 NOTE — Progress Notes (Signed)
Initial Nutrition Assessment  DOCUMENTATION CODES:   Obesity unspecified  INTERVENTION:   If tube feeds initiated, recommend:  Vital HP @ 35ml/hr +Prostat 60ml QID  Propofol: 6.67 ml/hr- Provides 176kcal/day   Free water flushes 41ml q4 hours to maintain tube patency   Regimen provides 1456kcal/day, 162g/day protien, 554ml/day free water   Recommend liquid MVI daily via tube   NUTRITION DIAGNOSIS:   Inadequate oral intake related to inability to eat(pt sedated and ventilated ) as evidenced by NPO status  GOAL:   Provide needs based on ASPEN/SCCM guidelines  MONITOR:   Vent status, Labs, Weight trends, Skin, I & O's  REASON FOR ASSESSMENT:   Ventilator    ASSESSMENT:   68 yr male with obesity, HTN, HLD admitted with right sided weakness and aphasia. CTA showed occluded Left MCA M2 region and s/p endovascular revascularization of occluded inferior dvision.   RD working remotely.  Pt remains intubated after procedure. No OGT in place. Suspect pt with good appetite and oral intake pta. Per chart, there is not a well documented weight history but pt's weight appears stable from his last weight taken in 01/2018.   Medications reviewed and include: insulin, protonix, NaCl @75ml /hr, propofol  Labs reviewed: K 3.4(L), P 2.7 wnl, Mg 1.8 wnl Wbc- 12.9(H)  Patient is currently intubated on ventilator support MV: 11.2 L/min Temp (24hrs), Avg:98.4 F (36.9 C), Min:97.4 F (36.3 C), Max:98.9 F (37.2 C)  Propofol: 6.67 ml/hr- Provides 176kcal/day   MAP- >68mmHg  UOP-  Unable to complete Nutrition-Focused physical exam at this time.   Diet Order:   Diet Order            Diet NPO time specified  Diet effective now             EDUCATION NEEDS:   Not appropriate for education at this time  Skin:  Skin Assessment: Reviewed RN Assessment  Last BM:  pta  Height:   Ht Readings from Last 1 Encounters:  12/21/18 6' (1.829 m)    Weight:   Wt  Readings from Last 1 Encounters:  12/21/18 111.1 kg    Ideal Body Weight:  80.9 kg  BMI:  Body mass index is 33.22 kg/m.  Estimated Nutritional Needs:   Kcal:  1221-1554kcal/day   Protein:  >162g/day   Fluid:  >2L/day   Corey Holiday MS, RD, LDN Pager #- [phone] Office#- [phone] After Hours Pager: [phone]

## 2018-12-22 NOTE — Progress Notes (Signed)
ANTICOAGULATION CONSULT NOTE  Pharmacy Consult:  Heparin Indication:  Cardiac thrombus  No Known Allergies  Patient Measurements: Height: 6' (182.9 cm) Weight: 244 lb 14.9 oz (111.1 kg) IBW/kg (Calculated) : 77.6 Heparin Dosing Weight: 101 kg  Vital Signs: Temp: 99 F (37.2 C) (04/04 2000) Temp Source: Axillary (04/04 2000) BP: 118/75 (04/04 2000) Pulse Rate: 90 (04/04 2000)  Labs: Recent Labs    12/21/18 1401 12/21/18 1408 12/21/18 1757 12/22/18 0429 12/22/18 1125  HGB 14.7  --  12.9* 13.5 12.6*  HCT 42.7  --  38.0* 40.6 37.0*  PLT 322  --   --  282  --   APTT 28  --   --   --   --   LABPROT 13.6  --   --   --   --   INR 1.1  --   --   --   --   CREATININE 0.97 0.90  --  0.82  --   TROPONINI  --   --   --  <0.03  --     Estimated Creatinine Clearance: 112.5 mL/min (by C-G formula based on SCr of 0.82 mg/dL).   Assessment: 68 year old obese male who was admitted on 12/21/18 for CVA.  Patient was outside of the window for tPA and underwent revascularization instead.  ECHO today 12/22/18 found a cardiac thrombus.  Pharmacy consulted to dose IV heparin.  Heparin level is slightly sub-therapeutic.  No issue with infusion per RN.  No bleeding reported.  Goal of Therapy:  Heparin level 0.3-0.5 units/mL Monitor platelets by anticoagulation protocol: Yes   Plan:  Increase heparin gtt to 1550 units/hr F/U AM labs  Kayli Beal D. Laney Potash, PharmD, BCPS, BCCCP 12/22/2018, 9:47 PM

## 2018-12-22 NOTE — Evaluation (Signed)
Physical Therapy Evaluation Patient Details Name: Corey Meyers MRN: [id number] DOB: 10-Aug-1951 Today's Date: 12/22/2018   History of Present Illness  68 y/o male with h/o CAD s/p previous NSTEMIs with stents to RCA and LAD in 2014 & 2015, ischemic CM EF 35% by echo in 2015, HTN, HL and obesity. 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 CT of head showing ischemic changes in the left MCA territory consistent with acute/subacute infarction. s/p clot extraction  Clinical Impression  Orders received for PT evaluation. Patient demonstrates deficits in functional mobility as indicated below. Will benefit from continued skilled PT to address deficits and maximize function. Will see as indicated and progress as tolerated.  Patient presumed independent prior to admission. At this time, patient with noted impulsivity and safety concerns as well as deficits in functional status due to asymetrical weakness and impairment balance/mobility. Feel patient would benefit from continue post acute rehabilitation to maximize recovery of function and improve cognitive awareness. Recommend CIR consult at this time.  OF NOTE: VSS throughout sesson.    Follow Up Recommendations CIR    Equipment Recommendations  (TBD)    Recommendations for Other Services Rehab consult     Precautions / Restrictions Precautions Precautions: Fall      Mobility  Bed Mobility Overal bed mobility: Needs Assistance Bed Mobility: Supine to Sit     Supine to sit: Min guard     General bed mobility comments: Min Guard A for safety. No physical A needed  Transfers Overall transfer level: Needs assistance Equipment used: None Transfers: Sit to/from Stand Sit to Stand: Min assist         General transfer comment: Min A for balance and to steady  Ambulation/Gait Ambulation/Gait assistance: Min assist Gait Distance (Feet): 16 Feet Assistive device: 1 person hand held assist Gait  Pattern/deviations: Step-through pattern;Decreased stride length;Shuffle Gait velocity: decreased   General Gait Details: patient with noted instability requiring hands on assist, easily distracted from task performance, multi modal cues for direction.   Stairs            Wheelchair Mobility    Modified Rankin (Stroke Patients Only) Modified Rankin (Stroke Patients Only) Pre-Morbid Rankin Score: No symptoms Modified Rankin: Moderately severe disability     Balance Overall balance assessment: Needs assistance Sitting-balance support: No upper extremity supported;Feet supported Sitting balance-Leahy Scale: Good     Standing balance support: No upper extremity supported;During functional activity Standing balance-Leahy Scale: Fair                               Pertinent Vitals/Pain Pain Assessment: Faces Faces Pain Scale: No hurt Pain Intervention(s): Monitored during session    Home Living Family/patient expects to be discharged to:: Inpatient rehab                 Additional Comments: Pt reporting he lives with his ex-wife. Unsure of reliability due to decreased congition and poor historian    Prior Function Level of Independence: Independent               Hand Dominance   Dominant Hand: Right    Extremity/Trunk Assessment   Upper Extremity Assessment Upper Extremity Assessment: RUE deficits/detail RUE Deficits / Details: Decreased grasp and pinch strength. Poor FM cooridination. Pt with difficulty following cues to complete MMT RUE Coordination: decreased fine motor;decreased gross motor    Lower Extremity Assessment Lower Extremity Assessment: RLE  deficits/detail;Difficult to assess due to impaired cognition RLE Deficits / Details: noted RLE weakness during functional mobility, grossly assymetrical weakness upon testing RLE Coordination: decreased fine motor;decreased gross motor    Cervical / Trunk Assessment Cervical / Trunk  Assessment: Normal  Communication   Communication: Expressive difficulties  Cognition Arousal/Alertness: Awake/alert Behavior During Therapy: Flat affect Overall Cognitive Status: Impaired/Different from baseline Area of Impairment: Orientation;Attention;Memory;Following commands;Safety/judgement;Awareness;Problem solving                   Current Attention Level: Sustained   Following Commands: Follows one step commands inconsistently;Follows one step commands with increased time(responds well to visual cues over verbal) Safety/Judgement: Decreased awareness of deficits;Decreased awareness of safety Awareness: Intellectual Problem Solving: Slow processing;Decreased initiation;Difficulty sequencing;Requires verbal cues;Requires tactile cues General Comments: patient with limited communication, noted significant delayed response upwards of ~10 seconds at times. Requiring increased time and benefits from visual cues.       General Comments General comments (skin integrity, edema, etc.): VSS throughout. SpO2 90s on RA.     Exercises     Assessment/Plan    PT Assessment Patient needs continued PT services  PT Problem List Decreased strength;Decreased activity tolerance;Decreased balance;Decreased mobility;Decreased coordination;Decreased cognition;Decreased safety awareness;Cardiopulmonary status limiting activity       PT Treatment Interventions DME instruction;Gait training;Stair training;Therapeutic activities;Functional mobility training;Therapeutic exercise;Balance training;Patient/family education;Cognitive remediation;Neuromuscular re-education    PT Goals (Current goals can be found in the Care Plan section)  Acute Rehab PT Goals Patient Stated Goal: Unstated PT Goal Formulation: Patient unable to participate in goal setting Time For Goal Achievement: 01/05/19 Potential to Achieve Goals: Good    Frequency Min 4X/week   Barriers to discharge Decreased caregiver  support      Co-evaluation PT/OT/SLP Co-Evaluation/Treatment: Yes Reason for Co-Treatment: Necessary to address cognition/behavior during functional activity;To address functional/ADL transfers;For patient/therapist safety PT goals addressed during session: Mobility/safety with mobility;Balance OT goals addressed during session: ADL's and self-care       AM-PAC PT "6 Clicks" Mobility  Outcome Measure Help needed turning from your back to your side while in a flat bed without using bedrails?: A Little Help needed moving from lying on your back to sitting on the side of a flat bed without using bedrails?: A Little Help needed moving to and from a bed to a chair (including a wheelchair)?: A Little Help needed standing up from a chair using your arms (e.g., wheelchair or bedside chair)?: A Little Help needed to walk in hospital room?: A Little Help needed climbing 3-5 steps with a railing? : A Lot 6 Click Score: 17    End of Session Equipment Utilized During Treatment: Gait belt;Oxygen Activity Tolerance: Patient tolerated treatment well Patient left: in chair;with call bell/phone within reach;with chair alarm set Nurse Communication: Mobility status PT Visit Diagnosis: Other abnormalities of gait and mobility (R26.89);Other symptoms and signs involving the nervous system (R29.898)    Time: 7672-0947 PT Time Calculation (min) (ACUTE ONLY): 20 min   Charges:   PT Evaluation $PT Eval Moderate Complexity: 1 Mod          Charlotte Crumb, PT DPT  Board Certified Neurologic Specialist Acute Rehabilitation Services Pager [phone] Office [phone]   Fabio Asa 12/22/2018, 3:15 PM

## 2018-12-22 NOTE — Progress Notes (Signed)
NAME:  Corey Meyers, MRN:  [id number], DOB:  November 04, 1950, LOS: 1 ADMISSION DATE:  12/21/2018, CONSULTATION DATE:  12/21/2018  REFERRING MD:  Dr Otelia Limes, CHIEF COMPLAINT:  Stroke on vent   Brief History   See hpi  History of present illness    68 yr male with obesity, with right sided weakness and aphasia. CTA showed occluded LEft MCA M2 region and s.p endovasculare revascularizatio of occluded inferior dvision. Post procedure bbrouhgt to 4N30 ICU on vent and ccm consulted for acute resp failure.  SBP goal 110-140 per neuro IR.  4/4:  Arouses to voice.  Sounds good on exam still heavily sedated.  CXR clear, labs okay.  Will start ps today with intent to extubate in the next 24 hr.  Past Medical History     has a past medical history of Hyperlipidemia, Hypertension, and Stroke (HCC).   reports that he has quit smoking. He has never used smokeless tobacco.  Past Surgical History:  Procedure Laterality Date   LAMINECTOMY     TONSILLECTOMY      No Known Allergies   There is no immunization history on file for this patient.  No family history on file.   Current Facility-Administered Medications:     stroke: mapping our early stages of recovery book, , Does not apply, Once, Arnetha Courser, MD   0.9 %  sodium chloride infusion, , Intravenous, Continuous, Arnetha Courser, MD, Last Rate: 75 mL/hr at 12/22/18 0858   acetaminophen (TYLENOL) tablet 650 mg, 650 mg, Oral, Q4H PRN **OR** acetaminophen (TYLENOL) solution 650 mg, 650 mg, Per Tube, Q4H PRN **OR** acetaminophen (TYLENOL) suppository 650 mg, 650 mg, Rectal, Q4H PRN, Arnetha Courser, MD   atorvastatin (LIPITOR) tablet 80 mg, 80 mg, Oral, q1800, Caryl Pina, MD   carvedilol (COREG) tablet 3.125 mg, 3.125 mg, Oral, BID WC, Caryl Pina, MD   clevidipine (CLEVIPREX) infusion 0.5 mg/mL, 0-21 mg/hr, Intravenous, Continuous, Deveshwar, Sanjeev, MD, Last Rate: 8 mL/hr at 12/22/18 0709, 4 mg/hr at 12/22/18 0709   fentaNYL (SUBLIMAZE)  injection 100 mcg, 100 mcg, Intravenous, Q15 min PRN, Kalman Shan, MD   fentaNYL (SUBLIMAZE) injection 100 mcg, 100 mcg, Intravenous, Q2H PRN, Kalman Shan, MD, 100 mcg at 12/22/18 2130   insulin aspart (novoLOG) injection 2-6 Units, 2-6 Units, Subcutaneous, Q4H, Kalman Shan, MD   midazolam (VERSED) injection 2 mg, 2 mg, Intravenous, Q15 min PRN, Kalman Shan, MD, 2 mg at 12/21/18 2118   midazolam (VERSED) injection 2 mg, 2 mg, Intravenous, Q2H PRN, Kalman Shan, MD   pantoprazole (PROTONIX) injection 40 mg, 40 mg, Intravenous, Q24H, Ramaswamy, Murali, MD, 40 mg at 12/21/18 1938   propofol (DIPRIVAN) 1000 MG/100ML infusion, 0-50 mcg/kg/min, Intravenous, Continuous, Kalman Shan, MD, Last Rate: 6.67 mL/hr at 12/22/18 0740, 10 mcg/kg/min at 12/22/18 0740   senna-docusate (Senokot-S) tablet 1 tablet, 1 tablet, Oral, QHS PRN, Arnetha Courser, MD   sodium chloride flush (NS) 0.9 % injection 3 mL, 3 mL, Intravenous, Once, Arby Barrette, MD   Significant Hospital Events   12/21/2018 - admit  Consults:  Primary neuro 12/21/2018  - ccm  Procedures:  x  Significant Diagnostic Tests:  x  Micro Data:  xz  Antimicrobials:   Antibiotics Given (last 72 hours)    None        Interim history/subjective:   - intubated  Objective   Blood pressure 120/77, pulse 80, temperature 98.7 F (37.1 C), temperature source Axillary, resp. rate 18, height 6' (1.829 m), weight 111.1 kg, SpO2 100 %.  Vent Mode: PRVC FiO2 (%):  [40 %-100 %] 40 % Set Rate:  [18 bmp] 18 bmp Vt Set:  [620 mL] 620 mL PEEP:  [5 cmH20] 5 cmH20 Pressure Support:  [10 cmH20] 10 cmH20 Plateau Pressure:  [11 cmH20-20 cmH20] 11 cmH20   Intake/Output Summary (Last 24 hours) at 12/22/2018 1026 Last data filed at 12/22/2018 1000 Gross per 24 hour  Intake 2234.6 ml  Output 1075 ml  Net 1159.6 ml   Filed Weights   12/21/18 1450  Weight: 111.1 kg    Examination: General: obese on  vent HENT: et tube + Lungs: Sync with vent Cardiovascular: normal heart sound Abdomen: obese Extremities: ingact Neuro: Sedated, able to lift both arms, lacks fine motor on the right, R lower ext leg movement minimal. GU: intact   LABS    PULMONARY Recent Labs  Lab 12/21/18 1757  PHART 7.401  PCO2ART 38.4  PO2ART 336.0*  HCO3 23.9  TCO2 25  O2SAT 100.0    CBC Recent Labs  Lab 12/21/18 1401 12/21/18 1757 12/22/18 0429  HGB 14.7 12.9* 13.5  HCT 42.7 38.0* 40.6  WBC 7.5  --  12.9*  PLT 322  --  282    COAGULATION Recent Labs  Lab 12/21/18 1401  INR 1.1    CARDIAC   Recent Labs  Lab 12/22/18 0429  TROPONINI <0.03   No results for input(s): PROBNP in the last 168 hours.   CHEMISTRY Recent Labs  Lab 12/21/18 1401 12/21/18 1408 12/21/18 1757 12/22/18 0429  NA 138  --  140 139  K 4.0  --  3.7 3.4*  CL 105  --   --  106  CO2 22  --   --  20*  GLUCOSE 87  --   --  93  BUN 13  --   --  12  CREATININE 0.97 0.90  --  0.82  CALCIUM 8.6*  --   --  8.1*  MG  --   --   --  1.8  PHOS  --   --   --  2.7   Estimated Creatinine Clearance: 112.5 mL/min (by C-G formula based on SCr of 0.82 mg/dL).   LIVER Recent Labs  Lab 12/21/18 1401 12/22/18 0429  AST 20 16  ALT 14 13  ALKPHOS 68 62  BILITOT 0.7 0.7  PROT 7.1 6.3*  ALBUMIN 3.7 3.3*  INR 1.1  --      INFECTIOUS No results for input(s): LATICACIDVEN, PROCALCITON in the last 168 hours.   ENDOCRINE CBG (last 3)  Recent Labs    12/21/18 1945 12/22/18 0318 12/22/18 0805  GLUCAP 76 96 84         IMAGING x48h  - image(s) personally visualized  -   highlighted in bold Ct Angio Head W Or Wo Contrast  Result Date: 12/21/2018 CLINICAL DATA:  Acute presentation with speech disturbance and right-sided weakness. EXAM: CT ANGIOGRAPHY HEAD AND NECK CT PERFUSION BRAIN TECHNIQUE: Multidetector CT imaging of the head and neck was performed using the standard protocol during bolus administration  of intravenous contrast. Multiplanar CT image reconstructions and MIPs were obtained to evaluate the vascular anatomy. Carotid stenosis measurements (when applicable) are obtained utilizing NASCET criteria, using the distal internal carotid diameter as the denominator. Multiphase CT imaging of the brain was performed following IV bolus contrast injection. Subsequent parametric perfusion maps were calculated using RAPID software. CONTRAST:  OMNIPAQUE IOHEXOL 350 MG/ML SOLN COMPARISON:  Head CT earlier same day FINDINGS: CTA  NECK FINDINGS Aortic arch: Minimal atherosclerosis of the arch. No aneurysm or dissection. Branching pattern is normal, with bovine origin pattern. Right carotid system: Common carotid artery is widely patent to the bifurcation. No atherosclerotic plaque at the bifurcation. Minimal soft plaque of the ICA bulb but no stenosis. Cervical ICA is widely patent. Left carotid system: Common carotid artery widely patent to the bifurcation. No plaque at the carotid bifurcation or ICA bulb. Cervical ICA is widely patent. Vertebral arteries: Both vertebral arteries are widely patent at their origins and through the cervical region to the foramen magnum. Skeleton: Mid cervical spondylosis. Other neck: No mass or lymphadenopathy. Upper chest: Negative Review of the MIP images confirms the above findings CTA HEAD FINDINGS Anterior circulation: On the right, the carotid siphon shows minimal atherosclerotic calcification but no stenosis. The anterior and middle cerebral vessels are patent. No sign of embolic occlusion on the right. On the left, there is mild atherosclerotic calcification of the siphon but no stenosis. The anterior and middle cerebral vessels are patent. There is an occluded left M2 branch with some distal reconstitution. Posterior circulation: Both vertebral arteries are widely patent to the basilar. No basilar stenosis. Posterior circulation branch vessels are normal. Venous sinuses:  Patent and normal. Anatomic variants: None significant. Delayed phase: No abnormal enhancement. Review of the MIP images confirms the above findings CT Brain Perfusion Findings: CBF (<30%) Volume: 0mL Perfusion (Tmax>6.0s) volume: 34mL Mismatch Volume: 34mL Infarction Location:No infarction. Area of delayed perfusion is in the left posterior frontal and parietal cortical and subcortical brain. M 5 and M 6 aspects territory. IMPRESSION: Embolic occlusion of a left M2 branch. T-max greater than 6 seconds in the left posterior frontal and parietal cortical and subcortical brain, without evidence of actual completed infarction by perfusion imaging. The findings in the basal ganglia and insula must relate to an old insult. I am somewhat surprised at the well visible low-density in the area of penumbra. However, given all this data, patient would seem to be a candidate for intervention. These results were called by telephone at the time of interpretation on 12/21/2018 at 2:40 pm to Dr. Otelia Limes, who verbally acknowledged these results. Electronically Signed   By: Paulina Fusi M.D.   On: 12/21/2018 14:45   Ct Angio Neck W Or Wo Contrast  Result Date: 12/21/2018 CLINICAL DATA:  Acute presentation with speech disturbance and right-sided weakness. EXAM: CT ANGIOGRAPHY HEAD AND NECK CT PERFUSION BRAIN TECHNIQUE: Multidetector CT imaging of the head and neck was performed using the standard protocol during bolus administration of intravenous contrast. Multiplanar CT image reconstructions and MIPs were obtained to evaluate the vascular anatomy. Carotid stenosis measurements (when applicable) are obtained utilizing NASCET criteria, using the distal internal carotid diameter as the denominator. Multiphase CT imaging of the brain was performed following IV bolus contrast injection. Subsequent parametric perfusion maps were calculated using RAPID software. CONTRAST:  OMNIPAQUE IOHEXOL 350 MG/ML SOLN COMPARISON:  Head CT  earlier same day FINDINGS: CTA NECK FINDINGS Aortic arch: Minimal atherosclerosis of the arch. No aneurysm or dissection. Branching pattern is normal, with bovine origin pattern. Right carotid system: Common carotid artery is widely patent to the bifurcation. No atherosclerotic plaque at the bifurcation. Minimal soft plaque of the ICA bulb but no stenosis. Cervical ICA is widely patent. Left carotid system: Common carotid artery widely patent to the bifurcation. No plaque at the carotid bifurcation or ICA bulb. Cervical ICA is widely patent. Vertebral arteries: Both vertebral arteries are widely patent at  their origins and through the cervical region to the foramen magnum. Skeleton: Mid cervical spondylosis. Other neck: No mass or lymphadenopathy. Upper chest: Negative Review of the MIP images confirms the above findings CTA HEAD FINDINGS Anterior circulation: On the right, the carotid siphon shows minimal atherosclerotic calcification but no stenosis. The anterior and middle cerebral vessels are patent. No sign of embolic occlusion on the right. On the left, there is mild atherosclerotic calcification of the siphon but no stenosis. The anterior and middle cerebral vessels are patent. There is an occluded left M2 branch with some distal reconstitution. Posterior circulation: Both vertebral arteries are widely patent to the basilar. No basilar stenosis. Posterior circulation branch vessels are normal. Venous sinuses: Patent and normal. Anatomic variants: None significant. Delayed phase: No abnormal enhancement. Review of the MIP images confirms the above findings CT Brain Perfusion Findings: CBF (<30%) Volume: 55mL Perfusion (Tmax>6.0s) volume: 56mL Mismatch Volume: 82mL Infarction Location:No infarction. Area of delayed perfusion is in the left posterior frontal and parietal cortical and subcortical brain. M 5 and M 6 aspects territory. IMPRESSION: Embolic occlusion of a left M2 branch. T-max greater than 6 seconds  in the left posterior frontal and parietal cortical and subcortical brain, without evidence of actual completed infarction by perfusion imaging. The findings in the basal ganglia and insula must relate to an old insult. I am somewhat surprised at the well visible low-density in the area of penumbra. However, given all this data, patient would seem to be a candidate for intervention. These results were called by telephone at the time of interpretation on 12/21/2018 at 2:40 pm to Dr. Otelia Limes, who verbally acknowledged these results. Electronically Signed   By: Paulina Fusi M.D.   On: 12/21/2018 14:45   Mr Brain Wo Contrast  Result Date: 12/22/2018 CLINICAL DATA:  Stroke follow-up EXAM: MRI HEAD WITHOUT CONTRAST TECHNIQUE: Multiplanar, multiecho pulse sequences of the brain and surrounding structures were obtained without intravenous contrast. COMPARISON:  CTA head neck 12/21/2018 FINDINGS: BRAIN: Intermediate sized area of acute ischemia within the left MCA territory, predominantly involving the parietal cortex and posterior left insula, as well as the left basal ganglia. There is a very small area of ischemia in the left ACA territory. The midline structures are normal. No midline shift or other mass effect. Moderate edema at the ischemic sites. White-matter otherwise normal for age. The cerebral and cerebellar volume are age-appropriate. No hydrocephalus. Small focus of magnetic susceptibility effects in the affected left MCA territory may indicate a small fragment thrombus. No hemorrhage. No mass lesion. VASCULAR: The major intracranial arterial and venous sinus flow voids are normal. SKULL AND UPPER CERVICAL SPINE: Calvarial bone marrow signal is normal. There is no skull base mass. Visualized upper cervical spine and soft tissues are normal. SINUSES/ORBITS: Fluid levels in the sphenoid sinuses. The orbits are normal. IMPRESSION: 1. Intermediate volume left MCA territory acute infarct without hemorrhage or  significant mass effect. 2. Punctate foci of acute ischemia within the left anterior cerebral artery territory. Electronically Signed   By: Deatra Robinson M.D.   On: 12/22/2018 01:59   Ct Cerebral Perfusion W Contrast  Result Date: 12/21/2018 CLINICAL DATA:  Acute presentation with speech disturbance and right-sided weakness. EXAM: CT ANGIOGRAPHY HEAD AND NECK CT PERFUSION BRAIN TECHNIQUE: Multidetector CT imaging of the head and neck was performed using the standard protocol during bolus administration of intravenous contrast. Multiplanar CT image reconstructions and MIPs were obtained to evaluate the vascular anatomy. Carotid stenosis measurements (when applicable) are  obtained utilizing NASCET criteria, using the distal internal carotid diameter as the denominator. Multiphase CT imaging of the brain was performed following IV bolus contrast injection. Subsequent parametric perfusion maps were calculated using RAPID software. CONTRAST:  OMNIPAQUE IOHEXOL 350 MG/ML SOLN COMPARISON:  Head CT earlier same day FINDINGS: CTA NECK FINDINGS Aortic arch: Minimal atherosclerosis of the arch. No aneurysm or dissection. Branching pattern is normal, with bovine origin pattern. Right carotid system: Common carotid artery is widely patent to the bifurcation. No atherosclerotic plaque at the bifurcation. Minimal soft plaque of the ICA bulb but no stenosis. Cervical ICA is widely patent. Left carotid system: Common carotid artery widely patent to the bifurcation. No plaque at the carotid bifurcation or ICA bulb. Cervical ICA is widely patent. Vertebral arteries: Both vertebral arteries are widely patent at their origins and through the cervical region to the foramen magnum. Skeleton: Mid cervical spondylosis. Other neck: No mass or lymphadenopathy. Upper chest: Negative Review of the MIP images confirms the above findings CTA HEAD FINDINGS Anterior circulation: On the right, the carotid siphon shows minimal  atherosclerotic calcification but no stenosis. The anterior and middle cerebral vessels are patent. No sign of embolic occlusion on the right. On the left, there is mild atherosclerotic calcification of the siphon but no stenosis. The anterior and middle cerebral vessels are patent. There is an occluded left M2 branch with some distal reconstitution. Posterior circulation: Both vertebral arteries are widely patent to the basilar. No basilar stenosis. Posterior circulation branch vessels are normal. Venous sinuses: Patent and normal. Anatomic variants: None significant. Delayed phase: No abnormal enhancement. Review of the MIP images confirms the above findings CT Brain Perfusion Findings: CBF (<30%) Volume: 0mL Perfusion (Tmax>6.0s) volume: 34mL Mismatch Volume: 34mL Infarction Location:No infarction. Area of delayed perfusion is in the left posterior frontal and parietal cortical and subcortical brain. M 5 and M 6 aspects territory. IMPRESSION: Embolic occlusion of a left M2 branch. T-max greater than 6 seconds in the left posterior frontal and parietal cortical and subcortical brain, without evidence of actual completed infarction by perfusion imaging. The findings in the basal ganglia and insula must relate to an old insult. I am somewhat surprised at the well visible low-density in the area of penumbra. However, given all this data, patient would seem to be a candidate for intervention. These results were called by telephone at the time of interpretation on 12/21/2018 at 2:40 pm to Dr. Otelia Limes, who verbally acknowledged these results. Electronically Signed   By: Paulina Fusi M.D.   On: 12/21/2018 14:45   Dg Chest Port 1 View  Result Date: 12/22/2018 CLINICAL DATA:  Endotracheal tube. EXAM: PORTABLE CHEST 1 VIEW COMPARISON:  Yesterday FINDINGS: Endotracheal tube tip at the clavicular heads. Cardiomegaly and vascular pedicle widening. Coronary stent is present. Low lung volumes. There is no edema, consolidation,  effusion, or pneumothorax. IMPRESSION: Stable low volume chest.  Stable endotracheal tube positioning. Electronically Signed   By: Marnee Spring M.D.   On: 12/22/2018 08:07   Dg Chest Port 1 View  Result Date: 12/21/2018 CLINICAL DATA:  Altered mental status EXAM: PORTABLE CHEST 1 VIEW COMPARISON:  None. FINDINGS: There is hazy left lower lobe airspace disease which may reflect atelectasis versus pneumonia. There is no pleural effusion or pneumothorax. The heart and mediastinal contours are unremarkable. The osseous structures are unremarkable. IMPRESSION: 1. Hazy left lower lobe airspace disease which may reflect atelectasis versus pneumonia. Electronically Signed   By: Elige Ko   On: 12/21/2018  18:57   Ct Head Code Stroke Wo Contrast  Result Date: 12/21/2018 CLINICAL DATA:  Code stroke. Speech disturbance. Right-sided weakness. EXAM: CT HEAD WITHOUT CONTRAST TECHNIQUE: Contiguous axial images were obtained from the base of the skull through the vertex without intravenous contrast. COMPARISON:  None. FINDINGS: Brain: Evidence of acute/subacute ischemic change with loss of gray-white differentiation affecting the deep insula, M 5 and M 6 regions. Mild swelling. No hemorrhage. Probable involvement also of the corpus striated item on the left as well as the posterior limb internal capsule. No acute finding on the right. No evidence of definite pre-existing infarction. It is possible that some of the left basal ganglia changes could be old. No hydrocephalus. No extra-axial collection. Vascular: 1 could question slight asymmetric hyperdensity of the left middle cerebral artery. Skull: Negative Sinuses/Orbits: Ordinary mild seasonal mucosal inflammatory change. Orbits negative. Other: None ASPECTS ([place] Stroke Program Early CT Score) - Ganglionic level infarction (caudate, lentiform nuclei, internal capsule, insula, M1-M3 cortex): 3 - Supraganglionic infarction (M4-M6 cortex): 1 Total score (0-10 with 10  being normal): 4 IMPRESSION: 1. Ischemic changes in the left MCA territory consistent with acute/subacute infarction. Involvement of the left caudate, lentiform nuclei, deep insula, internal capsule, M 5 and M 6 regions. No sign of hemorrhage or mass effect. 2. ASPECTS is 4 3. These results were communicated to Dr. Otelia Limes at 2:21 pmon 4/3/2020by text page via the [facility] messaging system. Electronically Signed   By: Paulina Fusi M.D.   On: 12/21/2018 14:24     Resolved Hospital Problem list   x  Assessment & Plan:  Acute post op resp failure following stroke  PLAN - sbp 110-140 - diprivan gtt for sedation RASS goal 0-2 - full vent support - cxr check - check labs  Neuro primary  Best practice:  Diet: npo Pain/Anxiety/Delirium protocol (if indicated):  VAP protocol (if indicated): hob > 30 degres DVT prophylaxis: scd GI prophylaxis: proptonix Glucose control: ssi Mobility: bed rest Code Status: full Family Communication: none at beside Disposition: icu   ATTESTATION & SIGNATURE   The patient Corey Meyers is critically ill with multiple organ systems failure and requires high complexity decision making for assessment and support.  Total time in evaluation and treatment was 35 minutes.      Dr. Kalman Shan, M.D., [facility].C.P Pulmonary and Critical Care Medicine Staff Physician [facility] [place] [facility] Pager: [phone], If no answer or between  15:00h - 7:00h: call [phone]  12/22/2018 10:26 AM

## 2018-12-22 NOTE — Progress Notes (Signed)
ANTICOAGULATION CONSULT NOTE - Initial Consult  Pharmacy Consult for heparin Indication: stroke  No Known Allergies  Patient Measurements: Height: 6' (182.9 cm) Weight: 244 lb 14.9 oz (111.1 kg) IBW/kg (Calculated) : 77.6 Heparin Dosing Weight: 101 kg  Vital Signs: Temp: 98.7 F (37.1 C) (04/04 0800) Temp Source: Axillary (04/04 0800) BP: 123/81 (04/04 1100) Pulse Rate: 90 (04/04 1100)  Labs: Recent Labs    12/21/18 1401 12/21/18 1408 12/21/18 1757 12/22/18 0429 12/22/18 1125  HGB 14.7  --  12.9* 13.5 12.6*  HCT 42.7  --  38.0* 40.6 37.0*  PLT 322  --   --  282  --   APTT 28  --   --   --   --   LABPROT 13.6  --   --   --   --   INR 1.1  --   --   --   --   CREATININE 0.97 0.90  --  0.82  --   TROPONINI  --   --   --  <0.03  --     Estimated Creatinine Clearance: 112.5 mL/min (by C-G formula based on SCr of 0.82 mg/dL).   Medical History: Past Medical History:  Diagnosis Date  . Hyperlipidemia   . Hypertension   . Stroke [facility])     Medications:  Scheduled:  .  stroke: mapping our early stages of recovery book   Does not apply Once  . atorvastatin  80 mg Oral q1800  . carvedilol  3.125 mg Oral BID WC  . insulin aspart  2-6 Units Subcutaneous Q4H  . pantoprazole (PROTONIX) IV  40 mg Intravenous Q24H  . sodium chloride flush  3 mL Intravenous Once   Infusions:  . sodium chloride 75 mL/hr at 12/22/18 0858  . clevidipine 4 mg/hr (12/22/18 0709)  . propofol (DIPRIVAN) infusion 10 mcg/kg/min (12/22/18 0740)    Assessment: Obese pt who was admitted yesterday for CVA. He didn't receive tPA due to time window. He under gone revascularization. He was admitted to the ICU for vent support. ECHO this AM found to have a cardiac thrombus. Heparin has been ordered with a lower goal.  Goal of Therapy:  Heparin level 0.3-0.5 Monitor platelets by anticoagulation protocol: Yes   Plan:  Heparin infusion 1400 units/hr F/u with 6 hr heparin level Daily heparin level  and CBC  Ulyses Southward, PharmD, BCIDP, AAHIVP, CPP Infectious Disease Pharmacist 12/22/2018 12:35 PM

## 2018-12-23 ENCOUNTER — Inpatient Hospital Stay (HOSPITAL_COMMUNITY): Payer: Medicare HMO

## 2018-12-23 DIAGNOSIS — I2510 Atherosclerotic heart disease of native coronary artery without angina pectoris: Secondary | ICD-10-CM

## 2018-12-23 DIAGNOSIS — R4701 Aphasia: Secondary | ICD-10-CM

## 2018-12-23 DIAGNOSIS — M25512 Pain in left shoulder: Secondary | ICD-10-CM

## 2018-12-23 DIAGNOSIS — I63412 Cerebral infarction due to embolism of left middle cerebral artery: Principal | ICD-10-CM

## 2018-12-23 DIAGNOSIS — I2583 Coronary atherosclerosis due to lipid rich plaque: Secondary | ICD-10-CM

## 2018-12-23 LAB — BASIC METABOLIC PANEL
Anion gap: 8 (ref 5–15)
BUN: 9 mg/dL (ref 8–23)
CO2: 24 mmol/L (ref 22–32)
Calcium: 8.9 mg/dL (ref 8.9–10.3)
Chloride: 107 mmol/L (ref 98–111)
Creatinine, Ser: 0.85 mg/dL (ref 0.61–1.24)
GFR calc Af Amer: >60 mL/min (ref >60)
GFR calc non Af Amer: >60 mL/min (ref >60)
Glucose, Bld: 92 mg/dL (ref 70–99)
Potassium: 3.7 mmol/L (ref 3.5–5.1)
Sodium: 139 mmol/L (ref 135–145)

## 2018-12-23 LAB — RAPID URINE DRUG SCREEN, HOSP PERFORMED
Amphetamines: NOT DETECTED
Barbiturates: NOT DETECTED
Benzodiazepines: POSITIVE — AB
Cocaine: NOT DETECTED
Opiates: NOT DETECTED
Tetrahydrocannabinol: NOT DETECTED

## 2018-12-23 LAB — URINALYSIS, ROUTINE W REFLEX MICROSCOPIC
Bilirubin Urine: NEGATIVE
Glucose, UA: NEGATIVE mg/dL
Hgb urine dipstick: NEGATIVE
Ketones, ur: NEGATIVE mg/dL
Leukocytes,Ua: NEGATIVE
Nitrite: NEGATIVE
Protein, ur: NEGATIVE mg/dL
Specific Gravity, Urine: 1.006 (ref 1.005–1.030)
pH: 8 (ref 5.0–8.0)

## 2018-12-23 LAB — CBC WITH DIFFERENTIAL/PLATELET
Abs Immature Granulocytes: 0.04 10*3/uL (ref 0.00–0.07)
Basophils Absolute: 0 10*3/uL (ref 0.0–0.1)
Basophils Relative: 0 %
Eosinophils Absolute: 0.4 10*3/uL (ref 0.0–0.5)
Eosinophils Relative: 3 %
HCT: 41 % (ref 39.0–52.0)
Hemoglobin: 14 g/dL (ref 13.0–17.0)
Immature Granulocytes: 0 %
Lymphocytes Relative: 22 %
Lymphs Abs: 2.6 10*3/uL (ref 0.7–4.0)
MCH: 29.8 pg (ref 26.0–34.0)
MCHC: 34.1 g/dL (ref 30.0–36.0)
MCV: 87.2 fL (ref 80.0–100.0)
Monocytes Absolute: 1.1 10*3/uL — ABNORMAL HIGH (ref 0.1–1.0)
Monocytes Relative: 9 %
Neutro Abs: 7.7 10*3/uL (ref 1.7–7.7)
Neutrophils Relative %: 66 %
Platelets: 270 10*3/uL (ref 150–400)
RBC: 4.7 MIL/uL (ref 4.22–5.81)
RDW: 13.4 % (ref 11.5–15.5)
WBC: 11.8 10*3/uL — ABNORMAL HIGH (ref 4.0–10.5)
nRBC: 0 % (ref 0.0–0.2)

## 2018-12-23 LAB — GLUCOSE, CAPILLARY
Glucose-Capillary: 80 mg/dL (ref 70–99)
Glucose-Capillary: 85 mg/dL (ref 70–99)
Glucose-Capillary: 114 mg/dL — ABNORMAL HIGH (ref 70–99)
Glucose-Capillary: 103 mg/dL — ABNORMAL HIGH (ref 70–99)
Glucose-Capillary: 103 mg/dL — ABNORMAL HIGH (ref 70–99)

## 2018-12-23 LAB — HEPARIN LEVEL (UNFRACTIONATED): Heparin Unfractionated: 0.4 IU/mL (ref 0.30–0.70)

## 2018-12-23 LAB — PHOSPHORUS: Phosphorus: 2.9 mg/dL (ref 2.5–4.6)

## 2018-12-23 LAB — MAGNESIUM: Magnesium: 2 mg/dL (ref 1.7–2.4)

## 2018-12-23 MED ORDER — LABETALOL HCL 5 MG/ML IV SOLN: 5.0000 mg | INTRAVENOUS | Status: DC | PRN

## 2018-12-23 NOTE — Progress Notes (Signed)
ANTICOAGULATION CONSULT NOTE  Pharmacy Consult:  Heparin Indication:  Cardiac thrombus  No Known Allergies  Patient Measurements: Height: 6' (182.9 cm) Weight: 244 lb 14.9 oz (111.1 kg) IBW/kg (Calculated) : 77.6 Heparin Dosing Weight: 101 kg  Vital Signs: Temp: 99.9 F (37.7 C) (04/05 0800) Temp Source: Axillary (04/05 0800) BP: 136/87 (04/05 0800) Pulse Rate: 91 (04/05 0800)  Labs: Recent Labs    12/21/18 1401 12/21/18 1408  12/22/18 0429 12/22/18 1125 12/22/18 2109 12/23/18 0400 12/23/18 0712  HGB 14.7  --    < > 13.5 12.6*  --  14.0  --   HCT 42.7  --    < > 40.6 37.0*  --  41.0  --   PLT 322  --   --  282  --   --  270  --   APTT 28  --   --   --   --   --   --   --   LABPROT 13.6  --   --   --   --   --   --   --   INR 1.1  --   --   --   --   --   --   --   HEPARINUNFRC  --   --   --   --   --  0.26*  --  0.40  CREATININE 0.97 0.90  --  0.82  --   --  0.85  --   TROPONINI  --   --   --  <0.03  --   --   --   --    < > = values in this interval not displayed.    Estimated Creatinine Clearance: 108.5 mL/min (by C-G formula based on SCr of 0.85 mg/dL).   Assessment: 68 year old obese male who was admitted on 12/21/18 for CVA.  Patient was outside of the window for tPA and underwent revascularization instead.  ECHO today 12/22/18 found a cardiac thrombus.  Pharmacy consulted to dose IV heparin.  Heparin level therapeutic this AM  Goal of Therapy:  Heparin level 0.3-0.5 units/mL Monitor platelets by anticoagulation protocol: Yes   Plan:  Continue heparin at 1550 units / hr F/U AM labs  Thank you  Okey Regal, PharmD [phone] 12/23/2018, 8:25 AM

## 2018-12-23 NOTE — Progress Notes (Signed)
NAME:  Corey Meyers, MRN:  [id number], DOB:  1951/01/09, LOS: 2 ADMISSION DATE:  12/21/2018, CONSULTATION DATE:  12/21/2018  REFERRING MD:  Dr Otelia Limes, CHIEF COMPLAINT:  Stroke on vent   Brief History   See hpi  History of present illness    68 yr male with obesity, with right sided weakness and aphasia. CTA showed occluded LEft MCA M2 region and s.p endovasculare revascularizatio of occluded inferior dvision. Post procedure bbrouhgt to 4N30 ICU on vent and ccm consulted for acute resp failure.  SBP goal 110-140 per neuro IR.    Past Medical History     has a past medical history of Hyperlipidemia, Hypertension, and Stroke (HCC).   reports that he has quit smoking. He has never used smokeless tobacco.  Past Surgical History:  Procedure Laterality Date   LAMINECTOMY     TONSILLECTOMY      No Known Allergies   There is no immunization history on file for this patient.  No family history on file.   Current Facility-Administered Medications:    0.9 %  sodium chloride infusion, , Intravenous, Continuous, Bensimhon, Bevelyn Buckles, MD, Last Rate: 10 mL/hr at 12/22/18 1351   acetaminophen (TYLENOL) tablet 650 mg, 650 mg, Oral, Q4H PRN **OR** acetaminophen (TYLENOL) solution 650 mg, 650 mg, Per Tube, Q4H PRN **OR** acetaminophen (TYLENOL) suppository 650 mg, 650 mg, Rectal, Q4H PRN, Arnetha Courser, MD   atorvastatin (LIPITOR) tablet 80 mg, 80 mg, Oral, q1800, Caryl Pina, MD, 80 mg at 12/22/18 1647   carvedilol (COREG) tablet 3.125 mg, 3.125 mg, Oral, BID WC, Caryl Pina, MD, 3.125 mg at 12/22/18 1646   fentaNYL (SUBLIMAZE) injection 100 mcg, 100 mcg, Intravenous, Q15 min PRN, Kalman Shan, MD   fentaNYL (SUBLIMAZE) injection 100 mcg, 100 mcg, Intravenous, Q2H PRN, Kalman Shan, MD, 100 mcg at 12/22/18 0512   heparin ADULT infusion 100 units/mL (25000 units/258mL sodium chloride 0.45%), 1,550 Units/hr, Intravenous, Continuous, Dang, Thuy D, RPH, Last Rate: 15.5 mL/hr at  12/23/18 0526, 1,550 Units/hr at 12/23/18 0526   insulin aspart (novoLOG) injection 2-6 Units, 2-6 Units, Subcutaneous, Q4H, Ramaswamy, Murali, MD   midazolam (VERSED) injection 2 mg, 2 mg, Intravenous, Q15 min PRN, Kalman Shan, MD, 2 mg at 12/21/18 2118   midazolam (VERSED) injection 2 mg, 2 mg, Intravenous, Q2H PRN, Kalman Shan, MD, 2 mg at 12/22/18 0030   pantoprazole (PROTONIX) EC tablet 40 mg, 40 mg, Oral, Daily, Naomie Dean B, MD, 40 mg at 12/22/18 1646   senna-docusate (Senokot-S) tablet 1 tablet, 1 tablet, Oral, QHS PRN, Arnetha Courser, MD   Significant Hospital Events   12/21/2018 - admit  Consults:  Primary neuro 12/21/2018  - ccm 4/4:  Arouses to voice.  Sounds good on exam still heavily sedated.  CXR clear, labs okay.  Will start ps today with intent to extubate in the next 24 hr. 4/5  Extubated yesterday without incident.  Awake, alert some degree of flat affect but otherwise looks good.  Procedures:  x  Significant Diagnostic Tests:  x  Micro Data:  xz  Antimicrobials:   Antibiotics Given (last 72 hours)    None        Interim history/subjective:   - intubated  Objective   Blood pressure 136/87, pulse 91, temperature 99.9 F (37.7 C), temperature source Axillary, resp. rate 16, height 6' (1.829 m), weight 111.1 kg, SpO2 92 %.        Intake/Output Summary (Last 24 hours) at 12/23/2018 0836 Last data filed at  12/23/2018 0800 Gross per 24 hour  Intake 1143.37 ml  Output 950 ml  Net 193.37 ml   Filed Weights   12/21/18 1450  Weight: 111.1 kg    Examination: General: obese on vent HENT: et tube + Lungs: Sync with vent Cardiovascular: normal heart sound Abdomen: obese Extremities: ingact Neuro: Sedated, able to lift both arms, lacks fine motor on the right, R lower ext leg movement minimal. GU: intact   LABS    PULMONARY Recent Labs  Lab 12/21/18 1757 12/22/18 1125  PHART 7.401 7.445  PCO2ART 38.4 29.8*  PO2ART 336.0* 89.0   HCO3 23.9 20.5  TCO2 25 21*  O2SAT 100.0 97.0    CBC Recent Labs  Lab 12/21/18 1401  12/22/18 0429 12/22/18 1125 12/23/18 0400  HGB 14.7   < > 13.5 12.6* 14.0  HCT 42.7   < > 40.6 37.0* 41.0  WBC 7.5  --  12.9*  --  11.8*  PLT 322  --  282  --  270   < > = values in this interval not displayed.    COAGULATION Recent Labs  Lab 12/21/18 1401  INR 1.1    CARDIAC   Recent Labs  Lab 12/22/18 0429  TROPONINI <0.03   No results for input(s): PROBNP in the last 168 hours.   CHEMISTRY Recent Labs  Lab 12/21/18 1401 12/21/18 1408 12/21/18 1757 12/22/18 0429 12/22/18 1125 12/23/18 0400  NA 138  --  140 139 141 139  K 4.0  --  3.7 3.4* 3.5 3.7  CL 105  --   --  106  --  107  CO2 22  --   --  20*  --  24  GLUCOSE 87  --   --  93  --  92  BUN 13  --   --  12  --  9  CREATININE 0.97 0.90  --  0.82  --  0.85  CALCIUM 8.6*  --   --  8.1*  --  8.9  MG  --   --   --  1.8  --  2.0  PHOS  --   --   --  2.7  --  2.9   Estimated Creatinine Clearance: 108.5 mL/min (by C-G formula based on SCr of 0.85 mg/dL).   LIVER Recent Labs  Lab 12/21/18 1401 12/22/18 0429  AST 20 16  ALT 14 13  ALKPHOS 68 62  BILITOT 0.7 0.7  PROT 7.1 6.3*  ALBUMIN 3.7 3.3*  INR 1.1  --      INFECTIOUS No results for input(s): LATICACIDVEN, PROCALCITON in the last 168 hours.   ENDOCRINE CBG (last 3)  Recent Labs    12/22/18 2255 12/23/18 0333 12/23/18 0753  GLUCAP 93 80 85         IMAGING x48h  - image(s) personally visualized  -   highlighted in bold Ct Angio Head W Or Wo Contrast  Result Date: 12/21/2018 CLINICAL DATA:  Acute presentation with speech disturbance and right-sided weakness. EXAM: CT ANGIOGRAPHY HEAD AND NECK CT PERFUSION BRAIN TECHNIQUE: Multidetector CT imaging of the head and neck was performed using the standard protocol during bolus administration of intravenous contrast. Multiplanar CT image reconstructions and MIPs were obtained to evaluate the  vascular anatomy. Carotid stenosis measurements (when applicable) are obtained utilizing NASCET criteria, using the distal internal carotid diameter as the denominator. Multiphase CT imaging of the brain was performed following IV bolus contrast injection. Subsequent parametric perfusion maps were calculated  using RAPID software. CONTRAST:  OMNIPAQUE IOHEXOL 350 MG/ML SOLN COMPARISON:  Head CT earlier same day FINDINGS: CTA NECK FINDINGS Aortic arch: Minimal atherosclerosis of the arch. No aneurysm or dissection. Branching pattern is normal, with bovine origin pattern. Right carotid system: Common carotid artery is widely patent to the bifurcation. No atherosclerotic plaque at the bifurcation. Minimal soft plaque of the ICA bulb but no stenosis. Cervical ICA is widely patent. Left carotid system: Common carotid artery widely patent to the bifurcation. No plaque at the carotid bifurcation or ICA bulb. Cervical ICA is widely patent. Vertebral arteries: Both vertebral arteries are widely patent at their origins and through the cervical region to the foramen magnum. Skeleton: Mid cervical spondylosis. Other neck: No mass or lymphadenopathy. Upper chest: Negative Review of the MIP images confirms the above findings CTA HEAD FINDINGS Anterior circulation: On the right, the carotid siphon shows minimal atherosclerotic calcification but no stenosis. The anterior and middle cerebral vessels are patent. No sign of embolic occlusion on the right. On the left, there is mild atherosclerotic calcification of the siphon but no stenosis. The anterior and middle cerebral vessels are patent. There is an occluded left M2 branch with some distal reconstitution. Posterior circulation: Both vertebral arteries are widely patent to the basilar. No basilar stenosis. Posterior circulation branch vessels are normal. Venous sinuses: Patent and normal. Anatomic variants: None significant. Delayed phase: No abnormal enhancement. Review of  the MIP images confirms the above findings CT Brain Perfusion Findings: CBF (<30%) Volume: 83mL Perfusion (Tmax>6.0s) volume: 58mL Mismatch Volume: 11mL Infarction Location:No infarction. Area of delayed perfusion is in the left posterior frontal and parietal cortical and subcortical brain. M 5 and M 6 aspects territory. IMPRESSION: Embolic occlusion of a left M2 branch. T-max greater than 6 seconds in the left posterior frontal and parietal cortical and subcortical brain, without evidence of actual completed infarction by perfusion imaging. The findings in the basal ganglia and insula must relate to an old insult. I am somewhat surprised at the well visible low-density in the area of penumbra. However, given all this data, patient would seem to be a candidate for intervention. These results were called by telephone at the time of interpretation on 12/21/2018 at 2:40 pm to Dr. Otelia Limes, who verbally acknowledged these results. Electronically Signed   By: Paulina Fusi M.D.   On: 12/21/2018 14:45   Ct Angio Neck W Or Wo Contrast  Result Date: 12/21/2018 CLINICAL DATA:  Acute presentation with speech disturbance and right-sided weakness. EXAM: CT ANGIOGRAPHY HEAD AND NECK CT PERFUSION BRAIN TECHNIQUE: Multidetector CT imaging of the head and neck was performed using the standard protocol during bolus administration of intravenous contrast. Multiplanar CT image reconstructions and MIPs were obtained to evaluate the vascular anatomy. Carotid stenosis measurements (when applicable) are obtained utilizing NASCET criteria, using the distal internal carotid diameter as the denominator. Multiphase CT imaging of the brain was performed following IV bolus contrast injection. Subsequent parametric perfusion maps were calculated using RAPID software. CONTRAST:  OMNIPAQUE IOHEXOL 350 MG/ML SOLN COMPARISON:  Head CT earlier same day FINDINGS: CTA NECK FINDINGS Aortic arch: Minimal atherosclerosis of the arch. No aneurysm or  dissection. Branching pattern is normal, with bovine origin pattern. Right carotid system: Common carotid artery is widely patent to the bifurcation. No atherosclerotic plaque at the bifurcation. Minimal soft plaque of the ICA bulb but no stenosis. Cervical ICA is widely patent. Left carotid system: Common carotid artery widely patent to the bifurcation. No plaque at  the carotid bifurcation or ICA bulb. Cervical ICA is widely patent. Vertebral arteries: Both vertebral arteries are widely patent at their origins and through the cervical region to the foramen magnum. Skeleton: Mid cervical spondylosis. Other neck: No mass or lymphadenopathy. Upper chest: Negative Review of the MIP images confirms the above findings CTA HEAD FINDINGS Anterior circulation: On the right, the carotid siphon shows minimal atherosclerotic calcification but no stenosis. The anterior and middle cerebral vessels are patent. No sign of embolic occlusion on the right. On the left, there is mild atherosclerotic calcification of the siphon but no stenosis. The anterior and middle cerebral vessels are patent. There is an occluded left M2 branch with some distal reconstitution. Posterior circulation: Both vertebral arteries are widely patent to the basilar. No basilar stenosis. Posterior circulation branch vessels are normal. Venous sinuses: Patent and normal. Anatomic variants: None significant. Delayed phase: No abnormal enhancement. Review of the MIP images confirms the above findings CT Brain Perfusion Findings: CBF (<30%) Volume: 71mL Perfusion (Tmax>6.0s) volume: 61mL Mismatch Volume: 39mL Infarction Location:No infarction. Area of delayed perfusion is in the left posterior frontal and parietal cortical and subcortical brain. M 5 and M 6 aspects territory. IMPRESSION: Embolic occlusion of a left M2 branch. T-max greater than 6 seconds in the left posterior frontal and parietal cortical and subcortical brain, without evidence of actual completed  infarction by perfusion imaging. The findings in the basal ganglia and insula must relate to an old insult. I am somewhat surprised at the well visible low-density in the area of penumbra. However, given all this data, patient would seem to be a candidate for intervention. These results were called by telephone at the time of interpretation on 12/21/2018 at 2:40 pm to Dr. Otelia Limes, who verbally acknowledged these results. Electronically Signed   By: Paulina Fusi M.D.   On: 12/21/2018 14:45   Mr Brain Wo Contrast  Result Date: 12/22/2018 CLINICAL DATA:  Stroke follow-up EXAM: MRI HEAD WITHOUT CONTRAST TECHNIQUE: Multiplanar, multiecho pulse sequences of the brain and surrounding structures were obtained without intravenous contrast. COMPARISON:  CTA head neck 12/21/2018 FINDINGS: BRAIN: Intermediate sized area of acute ischemia within the left MCA territory, predominantly involving the parietal cortex and posterior left insula, as well as the left basal ganglia. There is a very small area of ischemia in the left ACA territory. The midline structures are normal. No midline shift or other mass effect. Moderate edema at the ischemic sites. White-matter otherwise normal for age. The cerebral and cerebellar volume are age-appropriate. No hydrocephalus. Small focus of magnetic susceptibility effects in the affected left MCA territory may indicate a small fragment thrombus. No hemorrhage. No mass lesion. VASCULAR: The major intracranial arterial and venous sinus flow voids are normal. SKULL AND UPPER CERVICAL SPINE: Calvarial bone marrow signal is normal. There is no skull base mass. Visualized upper cervical spine and soft tissues are normal. SINUSES/ORBITS: Fluid levels in the sphenoid sinuses. The orbits are normal. IMPRESSION: 1. Intermediate volume left MCA territory acute infarct without hemorrhage or significant mass effect. 2. Punctate foci of acute ischemia within the left anterior cerebral artery territory.  Electronically Signed   By: Deatra Robinson M.D.   On: 12/22/2018 01:59   Ct Cerebral Perfusion W Contrast  Result Date: 12/21/2018 CLINICAL DATA:  Acute presentation with speech disturbance and right-sided weakness. EXAM: CT ANGIOGRAPHY HEAD AND NECK CT PERFUSION BRAIN TECHNIQUE: Multidetector CT imaging of the head and neck was performed using the standard protocol during bolus administration of intravenous  contrast. Multiplanar CT image reconstructions and MIPs were obtained to evaluate the vascular anatomy. Carotid stenosis measurements (when applicable) are obtained utilizing NASCET criteria, using the distal internal carotid diameter as the denominator. Multiphase CT imaging of the brain was performed following IV bolus contrast injection. Subsequent parametric perfusion maps were calculated using RAPID software. CONTRAST:  OMNIPAQUE IOHEXOL 350 MG/ML SOLN COMPARISON:  Head CT earlier same day FINDINGS: CTA NECK FINDINGS Aortic arch: Minimal atherosclerosis of the arch. No aneurysm or dissection. Branching pattern is normal, with bovine origin pattern. Right carotid system: Common carotid artery is widely patent to the bifurcation. No atherosclerotic plaque at the bifurcation. Minimal soft plaque of the ICA bulb but no stenosis. Cervical ICA is widely patent. Left carotid system: Common carotid artery widely patent to the bifurcation. No plaque at the carotid bifurcation or ICA bulb. Cervical ICA is widely patent. Vertebral arteries: Both vertebral arteries are widely patent at their origins and through the cervical region to the foramen magnum. Skeleton: Mid cervical spondylosis. Other neck: No mass or lymphadenopathy. Upper chest: Negative Review of the MIP images confirms the above findings CTA HEAD FINDINGS Anterior circulation: On the right, the carotid siphon shows minimal atherosclerotic calcification but no stenosis. The anterior and middle cerebral vessels are patent. No sign of embolic  occlusion on the right. On the left, there is mild atherosclerotic calcification of the siphon but no stenosis. The anterior and middle cerebral vessels are patent. There is an occluded left M2 branch with some distal reconstitution. Posterior circulation: Both vertebral arteries are widely patent to the basilar. No basilar stenosis. Posterior circulation branch vessels are normal. Venous sinuses: Patent and normal. Anatomic variants: None significant. Delayed phase: No abnormal enhancement. Review of the MIP images confirms the above findings CT Brain Perfusion Findings: CBF (<30%) Volume: 0mL Perfusion (Tmax>6.0s) volume: 34mL Mismatch Volume: 34mL Infarction Location:No infarction. Area of delayed perfusion is in the left posterior frontal and parietal cortical and subcortical brain. M 5 and M 6 aspects territory. IMPRESSION: Embolic occlusion of a left M2 branch. T-max greater than 6 seconds in the left posterior frontal and parietal cortical and subcortical brain, without evidence of actual completed infarction by perfusion imaging. The findings in the basal ganglia and insula must relate to an old insult. I am somewhat surprised at the well visible low-density in the area of penumbra. However, given all this data, patient would seem to be a candidate for intervention. These results were called by telephone at the time of interpretation on 12/21/2018 at 2:40 pm to Dr. Otelia Limes, who verbally acknowledged these results. Electronically Signed   By: Paulina Fusi M.D.   On: 12/21/2018 14:45   Dg Chest Port 1 View  Result Date: 12/22/2018 CLINICAL DATA:  Endotracheal tube. EXAM: PORTABLE CHEST 1 VIEW COMPARISON:  Yesterday FINDINGS: Endotracheal tube tip at the clavicular heads. Cardiomegaly and vascular pedicle widening. Coronary stent is present. Low lung volumes. There is no edema, consolidation, effusion, or pneumothorax. IMPRESSION: Stable low volume chest.  Stable endotracheal tube positioning. Electronically  Signed   By: Marnee Spring M.D.   On: 12/22/2018 08:07   Dg Chest Port 1 View  Result Date: 12/21/2018 CLINICAL DATA:  Altered mental status EXAM: PORTABLE CHEST 1 VIEW COMPARISON:  None. FINDINGS: There is hazy left lower lobe airspace disease which may reflect atelectasis versus pneumonia. There is no pleural effusion or pneumothorax. The heart and mediastinal contours are unremarkable. The osseous structures are unremarkable. IMPRESSION: 1. Hazy left lower lobe  airspace disease which may reflect atelectasis versus pneumonia. Electronically Signed   By: Elige Ko   On: 12/21/2018 18:57   Ct Head Code Stroke Wo Contrast  Result Date: 12/21/2018 CLINICAL DATA:  Code stroke. Speech disturbance. Right-sided weakness. EXAM: CT HEAD WITHOUT CONTRAST TECHNIQUE: Contiguous axial images were obtained from the base of the skull through the vertex without intravenous contrast. COMPARISON:  None. FINDINGS: Brain: Evidence of acute/subacute ischemic change with loss of gray-white differentiation affecting the deep insula, M 5 and M 6 regions. Mild swelling. No hemorrhage. Probable involvement also of the corpus striated item on the left as well as the posterior limb internal capsule. No acute finding on the right. No evidence of definite pre-existing infarction. It is possible that some of the left basal ganglia changes could be old. No hydrocephalus. No extra-axial collection. Vascular: 1 could question slight asymmetric hyperdensity of the left middle cerebral artery. Skull: Negative Sinuses/Orbits: Ordinary mild seasonal mucosal inflammatory change. Orbits negative. Other: None ASPECTS ([place] Stroke Program Early CT Score) - Ganglionic level infarction (caudate, lentiform nuclei, internal capsule, insula, M1-M3 cortex): 3 - Supraganglionic infarction (M4-M6 cortex): 1 Total score (0-10 with 10 being normal): 4 IMPRESSION: 1. Ischemic changes in the left MCA territory consistent with acute/subacute infarction.  Involvement of the left caudate, lentiform nuclei, deep insula, internal capsule, M 5 and M 6 regions. No sign of hemorrhage or mass effect. 2. ASPECTS is 4 3. These results were communicated to Dr. Otelia Limes at 2:21 pmon 4/3/2020by text page via the [facility] messaging system. Electronically Signed   By: Paulina Fusi M.D.   On: 12/21/2018 14:24     Resolved Hospital Problem list   x  Assessment & Plan:  Acute post op resp failure following stroke  PLAN - sbp 110-140 - diprivan gtt for sedation RASS goal 0-2 - full vent support - cxr check - check labs  Neuro primary  Best practice:  Diet: npo Pain/Anxiety/Delirium protocol (if indicated):  VAP protocol (if indicated): hob > 30 degres DVT prophylaxis: scd GI prophylaxis: proptonix Glucose control: ssi Mobility: bed rest Code Status: full Family Communication: none at beside Disposition: icu   ATTESTATION & SIGNATURE   Patient clinically stable currently without critical care issues, potentially could be transferred to the floor.  Will not follow daily, call if needed.  Total time in evaluation and treatment was 35 minutes.      Dr. Kalman Shan, M.D., [facility].C.P Pulmonary and Critical Care Medicine Staff Physician [facility] [place] [facility] Pager: [phone], If no answer or between  15:00h - 7:00h: call [phone]  12/23/2018 8:36 AM

## 2018-12-23 NOTE — Evaluation (Signed)
Speech Language Pathology Evaluation Patient Details Name: Corey Meyers MRN: [id number] DOB: [birth year] Today's Date: 12/23/2018 Time: 7048-8891 SLP Time Calculation (min) (ACUTE ONLY): 19 min  Problem List:  Patient Active Problem List   Diagnosis Date Noted  . Stroke (HCC) 12/21/2018  . 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 Hyperlipidemia   . Hypertension   . Stroke [facility])    Past Surgical History:  Past Surgical History:  Procedure Laterality Date  . LAMINECTOMY    . TONSILLECTOMY     HPI:  Pt is a 68 y.o. male with PMhx significant for HTN, dyslipidemia and CAD with stents in right PDA and mid LAD who was brought to ED via EMS when found with altered mental status and unresponsive by wife around 1 PM on 12/21/18. CT of the head on 12/21/18 showed ischemic changes in the left MCA territory consistent with acute/subacute infarction. Involvement of the left caudate, lentiform nuclei, deep insula, internal capsule, M 5 and M 6 regions. Thrombectomy was conducted on 12/21/18 and MRI from 12/23/18 showed left MCA territory acute infarct without hemorrhage or significant mass effect. Punctate foci of acute ischemia within the left anterior cerebral artery territory.   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 He was inconsistently able to produce social phrases such as "thank you" and "that'll be fine" but he exhibited 0% accuracy during all naming tasks and was unable to formulate more novel sentences. Perseveration and neologisms were noted during attempts at naming and did not improve with phonemic/semantic cues. Pt was able to follow 1-step commands and respond appropriately to yes/no questions but demonstrated increased difficulty  following more complex directions and at the paragraph level. No motor speech deficits were noted during the evaluation and cognition assessment was deferred due to pt's language deficits. Skilled SLP services are clinically indicated at this time to improve aphasia.     SLP Assessment  SLP Recommendation/Assessment: Patient needs continued Speech Lanaguage Pathology Services SLP Visit Diagnosis: Aphasia (R47.01)    Follow Up Recommendations  Inpatient Rehab    Frequency and Duration min 2x/week  2 weeks      SLP Evaluation Cognition  Overall Cognitive Status: Difficult to assess(Due to aphasia ) Orientation Level: Oriented to person       Comprehension  Auditory Comprehension Overall Auditory Comprehension: Impaired Yes/No Questions: Impaired Complex Questions: (4/5) Paragraph Comprehension (via yes/no questions): (3/4) Other Yes/No Questions Comments`: (Simple: 5/5) Commands: Impaired One Step Basic Commands: (4/4) Two Step Basic Commands: (0/3) Conversation: Simple Reading Comprehension Reading Status: Not tested    Expression Expression Primary Mode of Expression: Verbal Verbal Expression Overall Verbal Expression: Impaired Initiation: Impaired Automatic Speech: Counting;Day of week;Month of year(Counting: 4/10; Days: 0/7; Months: 0/12) Level of Generative/Spontaneous Verbalization: Phrase(Social phrases) Repetition: Impaired Level of Impairment: Sentence level(0/5) Naming: Impairment Responsive: (0/5) Confrontation: Impaired(0/8) Convergent: (0/4) Divergent: Not tested Verbal Errors: Neologisms;Perseveration Pragmatics: No impairment   Oral / Motor  Oral Motor/Sensory Function Overall Oral Motor/Sensory Function: Within functional limits Motor Speech Overall Motor Speech: Appears within functional limits for tasks assessed Respiration: Within functional limits Phonation: Normal Resonance: Within functional limits Articulation: Within functional  limitis Intelligibility: Intelligible Motor Planning: Witnin functional limits Motor Speech Errors: Not applicable   Zailah Zagami I. Vear Clock, MS, CCC-SLP Acute Rehabilitation Services Office number  [phone] Pager [phone]                   Scheryl Marten 12/23/2018, 11:07 AM

## 2018-12-23 NOTE — Progress Notes (Addendum)
STROKE TEAM PROGRESS NOTE   SUBJECTIVE (INTERVAL HISTORY) His nurse is at bedside.  Patient lying in bed, still has expressive aphasia, following commands.  On heparin IV, tolerating well.  OBJECTIVE Vitals:   12/23/18 1000 12/23/18 1100 12/23/18 1200 12/23/18 1300  BP: 132/83 134/84 135/86 114/76  Pulse: 74 72 85 72  Resp: Temp:      TempSrc:      SpO2: 97% 97% 99% 95%  Weight:      Height:        CBC:  Recent Labs  Lab 12/22/18 0429 12/22/18 1125 12/23/18 0400  WBC 12.9*  --  11.8*  NEUTROABS 10.3*  --  7.7  HGB 13.5 12.6* 14.0  HCT 40.6 37.0* 41.0  MCV 85.7  --  87.2  PLT 282  --  270    Basic Metabolic Panel:  Recent Labs  Lab 12/22/18 0429 12/22/18 1125 12/23/18 0400  NA 139 141 139  K 3.4* 3.5 3.7  CL 106  --  107  CO2 20*  --  24  GLUCOSE 93  --  92  BUN 12  --  9  CREATININE 0.82  --  0.85  CALCIUM 8.1*  --  8.9  MG 1.8  --  2.0  PHOS 2.7  --  2.9    Lipid Panel:     Component Value Date/Time   CHOL 165 12/22/2018 0429   CHOL 134 02/12/2014 0210   TRIG 131 12/22/2018 0429   TRIG 122 02/12/2014 0210   HDL 34 (L) 12/22/2018 0429   HDL 36 (L) 02/12/2014 0210   CHOLHDL 4.9 12/22/2018 0429   VLDL 26 12/22/2018 0429   VLDL 24 02/12/2014 0210   LDLCALC 105 (H) 12/22/2018 0429   LDLCALC 74 02/12/2014 0210   HgbA1c:  Lab Results  Component Value Date   HGBA1C 5.7 (H) 12/22/2018   Urine Drug Screen: No results found for: LABOPIA, COCAINSCRNUR, LABBENZ, AMPHETMU, THCU, LABBARB  Alcohol Level     Component Value Date/Time   ETH <10 12/21/2018 1410    IMAGING  Ct Angio Head W Or Wo Contrast Ct Angio Neck W Or Wo Contrast Ct Cerebral Perfusion W Contrast 12/21/2018 IMPRESSION:  Embolic occlusion of a left M2 branch. T-max greater than 6 seconds in the left posterior frontal and parietal cortical and subcortical brain, without evidence of actual completed infarction by perfusion imaging. The findings in the basal ganglia and  insula must relate to an old insult. I am somewhat surprised at the well visible low-density in the area of penumbra. However, given all this data, patient would seem to be a candidate for intervention.    Mr Brain Wo Contrast 12/22/2018 IMPRESSION:  1. Intermediate volume left MCA territory acute infarct without hemorrhage or significant mass effect.  2. Punctate foci of acute ischemia within the left anterior cerebral artery territory.    Ct Head Code Stroke Wo Contrast 12/21/2018 IMPRESSION:  1. Ischemic changes in the left MCA territory consistent with acute/subacute infarction. Involvement of the left caudate, lentiform nuclei, deep insula, internal capsule, M 5 and M 6 regions. No sign of hemorrhage or mass effect.  2. ASPECTS is 4    Dg Chest Port 1 View 12/21/2018 IMPRESSION:  Hazy left lower lobe airspace disease which may reflect atelectasis versus pneumonia.    Interventional Radiology - Cerebral Angiogram with Intervention - Dr Corliss Skains S/P lt common carotid arteriogram followed by complete revascularization of occluded M 2  Branch  of  Lt MCA inf division  Achieving a TICI 2b revascularization.   Transthoracic Echocardiogram  12/22/2018 IMPRESSIONS  1. Mild hypokinesis of the left ventricular, entire inferior wall.  2. Moderate hypokinesis of the left ventricular, entire anterior wall.  3. Severe akinesis of the left ventricular, entire apical segment.  4. Severe akinesis of the left ventricular, mid-apical anteroseptal wall.  5. Moderate, fixed thrombus on the apical wall of the left ventricle.  6. The left ventricle has moderately reduced systolic function, with an ejection fraction of 35-40%. The cavity size was normal. Left ventricular diastolic function could not be evaluated.  7. The right ventricle has normal systolic function. The cavity was normal. There is no increase in right ventricular wall thickness. Right ventricular systolic pressure could not be assessed.   8. Left atrial size was not assessed.  9. The pericardium was not assessed. 10. The aortic valve is grossly normal. 11. When compared to the prior study: Compared to prior reports in Care Everywhere, LV clot is new. No images for direct comparison. 12. The interatrial septum was not assessed.   EKG - SR rate 79 BPM. (See cardiology reading for complete details)    PHYSICAL EXAM Temp:  [98.2 F (36.8 C)-99.9 F (37.7 C)] 98.3 F (36.8 C) (04/05 1609) Pulse Rate:  [72-91] 75 (04/05 1609) Resp:  [14-24] 20 (04/05 1609) BP: (108-136)/(69-95) 118/76 (04/05 1609) SpO2:  [90 %-99 %] 97 % (04/05 1609)  General - Well nourished, well developed, in no apparent distress.  Ophthalmologic - fundi not visualized due to noncooperation.  Cardiovascular - Regular rate and rhythm.  Neuro - awake alert, Orientated to name, age and place.  Expressive aphasia, following simple commands.  Not able to name or repeat.  PERRL, EOMI, visual field full.  No significant facial asymmetry, tongue midline.  Moving extremity equally except left pronator drift due to left shoulder pain.  Sensation symmetrical, DTR 1+, no Babinski.  Coordination intact except left finger-to-nose slow due to left shoulder pain.  Gait not tested   ASSESSMENT/PLAN Corey Meyers is a 68 y.o. male with history of HTN, dyslipidemia and CAD with stents found minimally responsive, aphasic, except for a few mumbles, overall weak, more pronounced on right and not following commands. He did not receive IV t-PA due to late presentation. S/P lt common carotid arteriogram followed by complete revascularization of occluded M 2  Branch of  Lt MCA inf division  Achieving a TICI 2b revascularization.   Stroke: Left temporal MCA and punctate left ACA infarct - embolic - likely due to LV thrombus.  Resultant expressive aphasia  CT head - Ischemic changes in the left MCA territory consistent with acute/subacute infarction.  MRI head -   Intermediate volume left MCA territory acute infarct. Punctate foci of acute ischemia within the left ACA territory.   CTA H&N - Embolic occlusion of a left M2 branch. Basal ganglia and insula must relate to an old insult.  2D Echo - 35-40% - Moderate, fixed thrombus on the apical wall of the left ventricle.  LDL - 105  HgbA1c - 5.7  VTE prophylaxis - SCDs  aspirin 81 mg daily and Brilinta (ticagrelor) 90 mg bid prior to admission, now on heparin IV due to apical thrombus in the left ventricle.  Patient will be counseled to be compliant with his antithrombotic medications  Ongoing aggressive stroke risk factor management  Therapy recommendations:  CIR  Disposition:  Pending  LV thrombus  Echo showed EF  35 to 40% with LV apical thrombus  Cardiology on board  Heparin IV  We will consider switch to Norvasc once stable  Hypertension  Stable . Post procedure BP < 160 . Long-term BP goal normotensive  Hyperlipidemia  Lipid lowering medication PTA:  Lipitor 80 mg daily  LDL 105, goal < 70  Current lipid lowering medication: Lipitor 80 mg daily  Continue statin at discharge  CAD/non-STEMI  On aspirin and Brilinta PTA  Cardiology on board  Brilinta stopped as per cardiology given current heparin IV  Other Stroke Risk Factors  Advanced age  Former cigarette smoker - quit  ETOH use, advised to drink no more than 1 alcoholic beverage per day.  Obesity, Body mass index is 33.22 kg/m., recommend weight loss, diet and exercise as appropriate   Hx stroke/TIA - by imaging  Other Active Problems  Mild hypokalemia - 3.4 -> 3.7  Abnormal CXR - Hazy left lower lobe airspace disease which may reflect atelectasis versus pneumonia.   Mild Leukocytosis 12.9 (afebrile) ->11.8  Left shoulder pain - x-ray pending   Hospital day # 2  This patient is critically ill due to left MCA stroke, apical LV thrombus, CAD and at significant risk of neurological worsening,  death form recurrent stroke, hemorrhagic conversion, heart failure. This patient's care requires constant monitoring of vital signs, hemodynamics, respiratory and cardiac monitoring, review of multiple databases, neurological assessment, discussion with family, other specialists and medical decision making of high complexity. I spent 35 minutes of neurocritical care time in the care of this patient.   Marvel Plan, MD PhD Stroke Neurology 12/23/2018 5:20 PM   To contact Stroke Continuity provider, please refer to [email or web address]. After hours, contact General Neurology

## 2018-12-23 NOTE — Progress Notes (Signed)
  Speech Language Pathology Treatment: Cognitive-Linquistic(Aphasia)  Patient Details Name: Corey Meyers MRN: [id number] DOB: 13-Oct-1950 Today's Date: 12/23/2018 Time: 0865-7846 SLP Time Calculation (min) (ACUTE ONLY): 21 min  Assessment / Plan / Recommendation Clinical Impression  Pt was seen for aphasia treatment and a slight increase in verbal output was demonstrated compared to this morning's evaluation. He demonstrated 20% accuracy with confrontational naming increasing to 60% accuracy with semantic and phonemic cues. He also achieved 20% accuracy with simple phrase completion increasing to 60% accuracy with phonemic cues. He was able to complete a counting sequence up to 7 with min-mod cues. Pt responded to yes/no questions related to his meal and desires at the end of the session. SLP will continue to follow pt.    HPI HPI: Pt is a 68 y.o. male with PMhx significant for HTN, dyslipidemia and CAD with stents in right PDA and mid LAD who was brought to ED via EMS when found with altered mental status and unresponsive by wife around 1 PM on 12/21/18. CT of the head on 12/21/18 showed ischemic changes in the left MCA territory consistent with acute/subacute infarction. Involvement of the left caudate, lentiform nuclei, deep insula, internal capsule, M 5 and M 6 regions. Thrombectomy was conducted on 12/21/18 and MRI from 12/23/18 showed left MCA territory acute infarct without hemorrhage or significant mass effect. Punctate foci of acute ischemia within the left anterior cerebral artery territory.      SLP Plan  Continue with current plan of care       Recommendations                   Follow up Recommendations: Inpatient Rehab SLP Visit Diagnosis: Aphasia (R47.01) Plan: Continue with current plan of care       Arrow Tomko I. Vear Clock, MS, CCC-SLP Acute Rehabilitation Services Office number [phone] Pager [phone]                Scheryl Marten 12/23/2018, 5:09 PM

## 2018-12-23 NOTE — Progress Notes (Signed)
Pt's wife [name] called and made aware pt was moving to new room 3w20.

## 2018-12-24 ENCOUNTER — Encounter (HOSPITAL_COMMUNITY): Payer: Self-pay | Admitting: Interventional Radiology

## 2018-12-24 DIAGNOSIS — I5021 Acute systolic (congestive) heart failure: Secondary | ICD-10-CM

## 2018-12-24 DIAGNOSIS — I5022 Chronic systolic (congestive) heart failure: Secondary | ICD-10-CM

## 2018-12-24 DIAGNOSIS — I639 Cerebral infarction, unspecified: Secondary | ICD-10-CM

## 2018-12-24 LAB — BASIC METABOLIC PANEL
Anion gap: 9 (ref 5–15)
BUN: 9 mg/dL (ref 8–23)
CO2: 26 mmol/L (ref 22–32)
Calcium: 8.9 mg/dL (ref 8.9–10.3)
Chloride: 104 mmol/L (ref 98–111)
Creatinine, Ser: 0.79 mg/dL (ref 0.61–1.24)
GFR calc Af Amer: >60 mL/min (ref >60)
GFR calc non Af Amer: >60 mL/min (ref >60)
Glucose, Bld: 98 mg/dL (ref 70–99)
Potassium: 3.7 mmol/L (ref 3.5–5.1)
Sodium: 139 mmol/L (ref 135–145)

## 2018-12-24 LAB — GLUCOSE, CAPILLARY
Glucose-Capillary: 90 mg/dL (ref 70–99)
Glucose-Capillary: 156 mg/dL — ABNORMAL HIGH (ref 70–99)
Glucose-Capillary: 94 mg/dL (ref 70–99)
Glucose-Capillary: 94 mg/dL (ref 70–99)

## 2018-12-24 LAB — CBC WITH DIFFERENTIAL/PLATELET
Abs Immature Granulocytes: 0.03 10*3/uL (ref 0.00–0.07)
Basophils Absolute: 0.1 10*3/uL (ref 0.0–0.1)
Basophils Relative: 0 %
Eosinophils Absolute: 0.5 10*3/uL (ref 0.0–0.5)
Eosinophils Relative: 4 %
HCT: 42.2 % (ref 39.0–52.0)
Hemoglobin: 14 g/dL (ref 13.0–17.0)
Immature Granulocytes: 0 %
Lymphocytes Relative: 20 %
Lymphs Abs: 2.3 10*3/uL (ref 0.7–4.0)
MCH: 28.5 pg (ref 26.0–34.0)
MCHC: 33.2 g/dL (ref 30.0–36.0)
MCV: 85.9 fL (ref 80.0–100.0)
Monocytes Absolute: 1 10*3/uL (ref 0.1–1.0)
Monocytes Relative: 9 %
Neutro Abs: 7.6 10*3/uL (ref 1.7–7.7)
Neutrophils Relative %: 67 %
Platelets: 285 10*3/uL (ref 150–400)
RBC: 4.91 MIL/uL (ref 4.22–5.81)
RDW: 13 % (ref 11.5–15.5)
WBC: 11.5 10*3/uL — ABNORMAL HIGH (ref 4.0–10.5)
nRBC: 0 % (ref 0.0–0.2)

## 2018-12-24 LAB — HEPARIN LEVEL (UNFRACTIONATED): Heparin Unfractionated: 0.44 IU/mL (ref 0.30–0.70)

## 2018-12-24 MED ORDER — ENSURE ENLIVE PO LIQD
237.0000 mL | Freq: Every day | ORAL | Status: DC
  Administered 2018-12-24 – 2018-12-26 (×3): 237 mL via ORAL

## 2018-12-24 MED ORDER — APIXABAN 5 MG PO TABS: 10.0000 mg | ORAL_TABLET | Freq: Two times a day (BID) | ORAL | Status: DC

## 2018-12-24 MED ORDER — LOSARTAN POTASSIUM 25 MG PO TABS
25.0000 mg | ORAL_TABLET | Freq: Every day | ORAL | Status: DC
  Administered 2018-12-24 – 2018-12-27 (×4): 25 mg via ORAL
  Filled 2018-12-24 (×4): qty 1

## 2018-12-24 MED ORDER — APIXABAN 5 MG PO TABS: 5.0000 mg | ORAL_TABLET | Freq: Two times a day (BID) | ORAL | Status: DC

## 2018-12-24 MED ORDER — APIXABAN 5 MG PO TABS
5.0000 mg | ORAL_TABLET | Freq: Two times a day (BID) | ORAL | Status: DC
  Administered 2018-12-24 – 2018-12-27 (×7): 5 mg via ORAL
  Filled 2018-12-24 (×7): qty 1

## 2018-12-24 NOTE — Progress Notes (Addendum)
See ADDENDUM below:   Transition to PO Elquis.   ANTICOAGULATION CONSULT NOTE  Pharmacy Consult:  Heparin Indication:  Cardiac thrombus  No Known Allergies  Patient Measurements: Height: 6' (182.9 cm) Weight: 244 lb 14.9 oz (111.1 kg) IBW/kg (Calculated) : 77.6 Heparin Dosing Weight: 101 kg  Vital Signs: Temp: 98 F (36.7 C) (04/06 0722) Temp Source: Oral (04/06 0722) BP: 134/80 (04/06 0722) Pulse Rate: 75 (04/06 0722)  Labs: Recent Labs    12/21/18 1401  12/22/18 0429 12/22/18 1125 12/22/18 2109 12/23/18 0400 12/23/18 0712 12/24/18 0602  HGB 14.7   < > 13.5 12.6*  --  14.0  --  14.0  HCT 42.7   < > 40.6 37.0*  --  41.0  --  42.2  PLT 322  --  282  --   --  270  --  285  APTT 28  --   --   --   --   --   --   --   LABPROT 13.6  --   --   --   --   --   --   --   INR 1.1  --   --   --   --   --   --   --   HEPARINUNFRC  --   --   --   --  0.26*  --  0.40 0.44  CREATININE 0.97   < > 0.82  --   --  0.85  --  0.79  TROPONINI  --   --  <0.03  --   --   --   --   --    < > = values in this interval not displayed.    Estimated Creatinine Clearance: 115.3 mL/min (by C-G formula based on SCr of 0.79 mg/dL).   Assessment: 68 year old obese male who was admitted on 12/21/18 for CVA.  Patient was outside of the window for tPA and underwent revascularization instead.   ECHO 12/22/18 found a cardiac/LV thrombus.  Pharmacy consulted on 12/22/18  to dose IV heparin. Currently heparin level remains therapeutic at 0.44 on heparin rate 1550 units/hrthis AM.  CBC wnl, No bleeding noted. S/p intracranial thrombectomy 12/21/18    Goal of Therapy:  Heparin level 0.3-0.5 units/mL Monitor platelets by anticoagulation protocol: Yes   Plan:  Continue heparin at 1550 units / hr Daily HL, CBC.   Thank you  Noah Delaine, RPh Clinical Pharmacist Pager: [phone] Please check AMION for all [facility] phone numbers After 10:00 PM, call Main Pharmacy [phone] 12/24/2018, 10:25 AM    ADDENDUM:   Transition to PO Eliquis for LV thrombus and cardiomyopathy, likely occult AFib.  In setting of acute CVA.   I discussed dosing with cardiology pharmacists who recomomended dosing as for Afib at 5 mg BID.  Also Dr. Roda Shutters has discussed with Dr. Tenny Craw who recommends to dose at  5 mg BID.    Plan:  Discontinue IV heparin now and give 1st dose of Eliquis 5 mg PO BID.  Monitor for bleeding signs and symptoms.  Noah Delaine, RPh Clinical Pharmacist Pager: [phone] Please check AMION for all [facility] phone numbers After 10:00 PM, call Main Pharmacy [phone] 12/24/2018 1:03 PM

## 2018-12-24 NOTE — TOC Initial Note (Signed)
Transition of Care [facility]) - Initial/Assessment Note    Patient Details  Name: Corey Meyers MRN: [id number] Date of Birth: [birth year]  Transition of Care [facility]) CM/SW Contact:    Baldemar Lenis, LCSW Phone Number: 12/24/2018, 1:16 PM  Clinical Narrative:   Patient will not have 24/7 assist at discharge to admit to CIR, plan is for SNF. CSW discussed with patient's wife. Lengthy discussion with wife, discussing options and her concerns about the financial liability of being in a nursing home. CSW encouraged patient's wife to call [employer] to obtain specifics about coverage available and what her exact copay would be after day 20 at SNF. CSW to fax out and follow.                Expected Discharge Plan: Skilled Nursing Facility Barriers to Discharge: Insurance Authorization, Continued Medical Work up   Patient Goals and CMS Choice Patient states their goals for this hospitalization and ongoing recovery are:: patient unable to participate in goal setting CMS Medicare.gov Compare Post Acute Care list provided to:: Patient Represenative (must comment) Choice offered to / list presented to : Spouse  Expected Discharge Plan and Services Expected Discharge Plan: Skilled Nursing Facility       Living arrangements for the past 2 months: Single Family Home                          Prior Living Arrangements/Services Living arrangements for the past 2 months: Single Family Home Lives with:: Self Patient language and need for interpreter reviewed:: No Do you feel safe going back to the place where you live?: Yes      Need for Family Participation in Patient Care: Yes (Comment) Care giver support system in place?: No (comment)   Criminal Activity/Legal Involvement Pertinent to Current Situation/Hospitalization: No - Comment as needed  Activities of Daily Living      Permission Sought/Granted Permission sought to share information with : Facility Medical sales representative, Family  Supports Permission granted to share information with : Yes, Verbal Permission Granted  Share Information with NAME: [name]  Permission granted to share info w AGENCY: SNF  Permission granted to share info w Relationship: Wife     Emotional Assessment Appearance:: Appears stated age Attitude/Demeanor/Rapport: Unable to Assess Affect (typically observed): Unable to Assess Orientation: : Oriented to Self Alcohol / Substance Use: Not Applicable Psych Involvement: No (comment)  Admission diagnosis:  Stroke [facility]) [I63.9] Patient Active Problem List   Diagnosis Date Noted  . Stroke (HCC) 12/21/2018  . 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 [place] [address] Phone: [phone] Fax: [phone]  [facility] - [place], [place] - [address] AT Portal to Registered [facility] Sites [address] [place] [address] Phone: [phone] Fax: [phone]     Social Determinants of Health (SDOH) Interventions    Readmission Risk Interventions No flowsheet data found.

## 2018-12-24 NOTE — Progress Notes (Signed)
STROKE TEAM PROGRESS NOTE   SUBJECTIVE (INTERVAL HISTORY) Pt is working with PT in room. No motor deficit except left shoulder pain with mildly limited ROM. Still has mild aphasia but improved.   OBJECTIVE Vitals:   12/23/18 2330 12/24/18 0428 12/24/18 0722 12/24/18 1133  BP: 130/78 129/76 134/80 117/61  Pulse: 74 83 75 87  Resp: 17 16 20 20   Temp: 98.1 F (36.7 C) (!) 97.5 F (36.4 C) 98 F (36.7 C) 97.9 F (36.6 C)  TempSrc: Oral Oral Oral Oral  SpO2: 97% 96% 93% 97%  Weight:      Height:        CBC:  Recent Labs  Lab 12/23/18 0400 12/24/18 0602  WBC 11.8* 11.5*  NEUTROABS 7.7 7.6  HGB 14.0 14.0  HCT 41.0 42.2  MCV 87.2 85.9  PLT 270 285    Basic Metabolic Panel:  Recent Labs  Lab 12/22/18 0429  12/23/18 0400 12/24/18 0602  NA 139   < > 139 139  K 3.4*   < > 3.7 3.7  CL 106  --  107 104  CO2 20*  --  24 26  GLUCOSE 93  --  92 98  BUN 12  --  9 9  CREATININE 0.82  --  0.85 0.79  CALCIUM 8.1*  --  8.9 8.9  MG 1.8  --  2.0  --   PHOS 2.7  --  2.9  --    < > = values in this interval not displayed.    Lipid Panel:     Component Value Date/Time   CHOL 165 12/22/2018 0429   CHOL 134 02/12/2014 0210   TRIG 131 12/22/2018 0429   TRIG 122 02/12/2014 0210   HDL 34 (L) 12/22/2018 0429   HDL 36 (L) 02/12/2014 0210   CHOLHDL 4.9 12/22/2018 0429   VLDL 26 12/22/2018 0429   VLDL 24 02/12/2014 0210   LDLCALC 105 (H) 12/22/2018 0429   LDLCALC 74 02/12/2014 0210   HgbA1c:  Lab Results  Component Value Date   HGBA1C 5.7 (H) 12/22/2018   Urine Drug Screen:     Component Value Date/Time   LABOPIA NONE DETECTED 12/23/2018 1332   COCAINSCRNUR NONE DETECTED 12/23/2018 1332   LABBENZ POSITIVE (A) 12/23/2018 1332   AMPHETMU NONE DETECTED 12/23/2018 1332   THCU NONE DETECTED 12/23/2018 1332   LABBARB NONE DETECTED 12/23/2018 1332    Alcohol Level     Component Value Date/Time   ETH <10 12/21/2018 1410    IMAGING Ct Head Code Stroke Wo  Contrast 12/21/2018 IMPRESSION:  1. Ischemic changes in the left MCA territory consistent with acute/subacute infarction. Involvement of the left caudate, lentiform nuclei, deep insula, internal capsule, M 5 and M 6 regions. No sign of hemorrhage or mass effect.  2. ASPECTS is 4   Ct Angio Head W Or Wo Contrast Ct Angio Neck W Or Wo Contrast Ct Cerebral Perfusion W Contrast 12/21/2018 IMPRESSION:  Embolic occlusion of a left M2 branch. T-max greater than 6 seconds in the left posterior frontal and parietal cortical and subcortical brain, without evidence of actual completed infarction by perfusion imaging. The findings in the basal ganglia and insula must relate to an old insult. I am somewhat surprised at the well visible low-density in the area of penumbra. However, given all this data, patient would seem to be a candidate for intervention.   Interventional Radiology - Cerebral Angiogram with Intervention - Dr Corliss Skains S/P lt common carotid arteriogram followed  by complete revascularization of occluded M 2  Branch of  Lt MCA inf division  Achieving a TICI 2b revascularization.  Mr Brain Wo Contrast 12/22/2018 IMPRESSION:  1. Intermediate volume left MCA territory acute infarct without hemorrhage or significant mass effect.  2. Punctate foci of acute ischemia within the left anterior cerebral artery territory.   Dg Chest Port 1 View 12/21/2018 IMPRESSION:  Hazy left lower lobe airspace disease which may reflect atelectasis versus pneumonia.   Transthoracic Echocardiogram  12/22/2018 IMPRESSIONS  1. Mild hypokinesis of the left ventricular, entire inferior wall.  2. Moderate hypokinesis of the left ventricular, entire anterior wall.  3. Severe akinesis of the left ventricular, entire apical segment.  4. Severe akinesis of the left ventricular, mid-apical anteroseptal wall.  5. Moderate, fixed thrombus on the apical wall of the left ventricle.  6. The left ventricle has moderately reduced  systolic function, with an ejection fraction of 35-40%. The cavity size was normal. Left ventricular diastolic function could not be evaluated.  7. The right ventricle has normal systolic function. The cavity was normal. There is no increase in right ventricular wall thickness. Right ventricular systolic pressure could not be assessed.  8. Left atrial size was not assessed.  9. The pericardium was not assessed. 10. The aortic valve is grossly normal. 11. When compared to the prior study: Compared to prior reports in Care Everywhere, LV clot is new. No images for direct comparison. 12. The interatrial septum was not assessed.  EKG - SR rate 79 BPM. (See cardiology reading for complete details)   PHYSICAL EXAM Temp:  [97.5 F (36.4 C)-98.3 F (36.8 C)] 97.9 F (36.6 C) (04/06 1133) Pulse Rate:  [72-87] 87 (04/06 1133) Resp:  [16-20] 20 (04/06 1133) BP: (108-134)/(61-81) 117/61 (04/06 1133) SpO2:  [93 %-98 %] 97 % (04/06 1133)  General - Well nourished, well developed, in no apparent distress.  Ophthalmologic - fundi not visualized due to noncooperation.  Cardiovascular - Regular rate and rhythm.  Neuro - awake alert, Orientated to name, age and place.  Expressive aphasia, following simple commands.  Not able to name or repeat.  PERRL, EOMI, visual field full.  No significant facial asymmetry, tongue midline.  Moving extremity equally except left pronator drift due to left shoulder pain.  Sensation symmetrical, DTR 1+, no Babinski.  Coordination intact except left finger-to-nose slow due to left shoulder pain.  Gait not tested   ASSESSMENT/PLAN [name] is a 68 y.o. male with history of HTN, dyslipidemia and CAD with stents found minimally responsive, aphasic, except for a few mumbles, overall weak, more pronounced on right and not following commands. He did not receive IV t-PA due to late presentation. S/P lt common carotid arteriogram followed by complete revascularization of  occluded M 2  Branch of  Lt MCA inf division  Achieving a TICI 2b revascularization.   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  Resultant expressive aphasia  CT head - Ischemic changes in the left MCA territory consistent with acute/subacute infarction.  MRI head -  Intermediate volume left MCA territory acute infarct. Punctate foci of acute ischemia within the left ACA territory.   CTA H&N - Embolic occlusion of a left M2 branch. Basal ganglia and insula must relate to an old insult.  2D Echo - 35-40% - Moderate, fixed thrombus on the apical wall of the left ventricle.  LDL - 105  HgbA1c - 5.7  VTE prophylaxis - SCDs  aspirin 81 mg daily and Brilinta (ticagrelor) 90 mg bid prior to admission, now on heparin IV due to apical thrombus in the left ventricle. Transition to eliquis today.   Therapy recommendations:  CIR-> SNF (no assist at discharge)  Disposition:  Pending. Anticipate d/c Tues  LV thrombus  Echo showed EF 35 to 40% with LV apical thrombus  Cardiology on board  Heparin IV->transition to Eliquis today  Hypertension  Stable . Post procedure BP < 160 . Long-term BP goal normotensive  Hyperlipidemia  Lipid lowering medication PTA:  Lipitor 80 mg daily  LDL 105, goal < 70  Current lipid lowering medication: Lipitor 80 mg daily  Continue statin at discharge  CAD/non-STEMI  On aspirin and Brilinta PTA  Cardiology on board  Brilinta stopped as per cardiology given need for [facility]  On coreg and cozaar  Other Stroke Risk Factors  Advanced age  Former cigarette smoker - quit  ETOH use, advised to drink no more than 1 alcoholic beverage per day.  Obesity, Body mass index is 33.22 kg/m., recommend weight loss, diet and exercise as appropriate   Hx stroke/TIA - by imaging  Chronic systolic CHF  Other Active Problems  Mild hypokalemia - 3.7  Abnormal CXR - Hazy left lower lobe airspace disease  which may reflect atelectasis versus pneumonia.   Mild Leukocytosis 12.9 (afebrile) ->11.5  Left shoulder pain - x-ray no fx or dislocation  Hospital day # 3   Marvel Plan, MD PhD Stroke Neurology 12/24/2018 2:14 PM   To contact Stroke Continuity provider, please refer to [email or web address]. After hours, contact General Neurology

## 2018-12-24 NOTE — Progress Notes (Signed)
Occupational Therapy Treatment Patient Details Name: Corey Meyers MRN: [id number] DOB: 1951/05/14 Today's Date: 12/24/2018    History of present illness 68 y/o male with h/o CAD s/p previous NSTEMIs with stents to RCA and LAD in 2014 & 2015, ischemic CM EF 35% by echo in 2015, HTN, HL and obesity. 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 CT of head showing ischemic changes in the left MCA territory consistent with acute/subacute infarction. s/p clot extraction   OT comments  Patient pleasant and cooperative.  Able to follow 1 step commands during in room mobility and ADLs with increased time.  Min guard for basic transfers, mobility and grooming standing at sink.  Limited by aphasia, continue to recommend CIR.  Will follow.    Follow Up Recommendations  CIR;Supervision/Assistance - 24 hour    Equipment Recommendations  Other (comment)(TBD)    Recommendations for Other Services Rehab consult;PT consult;Speech consult    Precautions / Restrictions Precautions Precautions: Fall Restrictions Weight Bearing Restrictions: No       Mobility Bed Mobility Overal bed mobility: Needs Assistance Bed Mobility: Supine to Sit     Supine to sit: Min guard     General bed mobility comments: min guard for safety   Transfers Overall transfer level: Needs assistance Equipment used: None Transfers: Sit to/from Stand Sit to Stand: Min guard         General transfer comment: min guard for balance and safety    Balance Overall balance assessment: Needs assistance Sitting-balance support: No upper extremity supported;Feet supported Sitting balance-Leahy Scale: Good     Standing balance support: No upper extremity supported;During functional activity Standing balance-Leahy Scale: Poor Standing balance comment: min guard for safety within base of support                           ADL either performed or assessed with clinical judgement    ADL Overall ADL's : Needs assistance/impaired     Grooming: Wash/dry hands;Wash/dry face;Min Production designer, theatre/television/film: Min guard;Ambulation Toilet Transfer Details (indicate cue type and reason): simulated to recliner          Functional mobility during ADLs: Min guard General ADL Comments: min guard for safety and balance, able to follow 1 step commands with incrased time     Vision       Perception     Praxis      Cognition Arousal/Alertness: Awake/alert Behavior During Therapy: Flat affect Overall Cognitive Status: Difficult to assess Area of Impairment: Attention;Following commands;Safety/judgement;Awareness;Problem solving                   Current Attention Level: Sustained   Following Commands: Follows one step commands inconsistently;Follows one step commands with increased time Safety/Judgement: Decreased awareness of deficits;Decreased awareness of safety Awareness: Intellectual Problem Solving: Slow processing;Decreased initiation;Difficulty sequencing;Requires verbal cues;Requires tactile cues General Comments: slow processing, difficult to assess due to aphasia        Exercises Exercises: Other exercises General Exercises - Lower Extremity Hip Flexion/Marching: AROM;Both;10 reps;Standing Toe Raises: AROM;Both;10 reps;Standing Mini-Sqauts: AROM;Both;10 reps;Standing Other Exercises Other Exercises: AROM, shoulder flexion B UE x 10 reps    Shoulder Instructions       General Comments VSS    Pertinent Vitals/ Pain       Pain Assessment: No/denies pain Faces Pain Scale: No  hurt Pain Intervention(s): Monitored during session  Home Living                                          Prior Functioning/Environment              Frequency  Min 3X/week        Progress Toward Goals  OT Goals(current goals can now be found in the care plan section)  Progress towards OT goals:  Progressing toward goals  Acute Rehab OT Goals Patient Stated Goal: Unstated OT Goal Formulation: Patient unable to participate in goal setting Time For Goal Achievement: 01/05/19 Potential to Achieve Goals: Good  Plan Discharge plan remains appropriate;Frequency remains appropriate    Co-evaluation                 AM-PAC OT "6 Clicks" Daily Activity     Outcome Measure   Help from another person eating meals?: A Little Help from another person taking care of personal grooming?: A Little Help from another person toileting, which includes using toliet, bedpan, or urinal?: A Little Help from another person bathing (including washing, rinsing, drying)?: A Little Help from another person to put on and taking off regular upper body clothing?: A Little Help from another person to put on and taking off regular lower body clothing?: A Little 6 Click Score: 18    End of Session Equipment Utilized During Treatment: Gait belt  OT Visit Diagnosis: Unsteadiness on feet (R26.81);Other abnormalities of gait and mobility (R26.89);Muscle weakness (generalized) (M62.81);Other symptoms and signs involving cognitive function;Hemiplegia and hemiparesis Hemiplegia - Right/Left: Right Hemiplegia - dominant/non-dominant: Dominant Hemiplegia - caused by: Cerebral infarction   Activity Tolerance Patient tolerated treatment well   Patient Left in chair;with call bell/phone within reach;with chair alarm set   Nurse Communication Mobility status        Time: 1478-2956 OT Time Calculation (min): 14 min  Charges: OT General Charges $OT Visit: 1 Visit OT Treatments $Self Care/Home Management : 8-22 mins  Chancy Milroy, OT Acute Rehabilitation Services Pager [phone] Office [phone]    Chancy Milroy 12/24/2018, 5:25 PM

## 2018-12-24 NOTE — Progress Notes (Signed)
Progress Note  Patient Name: Corey Meyers Date of Encounter: 12/24/2018  Primary Cardiologist:   Subjective   APhasic    Comfortable     Inpatient Medications    Scheduled Meds: . atorvastatin  80 mg Oral q1800  . carvedilol  3.125 mg Oral BID WC  . insulin aspart  2-6 Units Subcutaneous Q4H  . pantoprazole  40 mg Oral Daily   Continuous Infusions: . sodium chloride 10 mL/hr at 12/22/18 1351  . heparin 1,550 Units/hr (12/23/18 2301)   PRN Meds: acetaminophen **OR** acetaminophen (TYLENOL) oral liquid 160 mg/5 mL **OR** acetaminophen, labetalol, senna-docusate   Vital Signs    Vitals:   12/23/18 1944 12/23/18 2330 12/24/18 0428 12/24/18 0722  BP: 124/81 130/78 129/76 134/80  Pulse: 75 74 83 75  Resp: 18 17 16 20   Temp: 98.2 F (36.8 C) 98.1 F (36.7 C) (!) 97.5 F (36.4 C) 98 F (36.7 C)  TempSrc: Oral Oral Oral Oral  SpO2: 95% 97% 96% 93%  Weight:      Height:        Intake/Output Summary (Last 24 hours) at 12/24/2018 0811 Last data filed at 12/24/2018 7673 Gross per 24 hour  Intake 459.42 ml  Output 3000 ml  Net -2540.58 ml   Net neg 1.4 L  Last 3 Weights 12/21/2018 08/05/2017  Weight (lbs) 244 lb 14.9 oz 245 lb  Weight (kg) 111.1 kg 111.131 kg      Telemetry    SR  - Personally Reviewed  ECG     Physical Exam   GEN: No acute distress.   Neck: No JVD Cardiac: RRR, no murmurs, rubs, or gallops.  Respiratory: Clear to auscultation bilaterally. GI: Soft, nontender, non-distended  MS: No edema; No deformity. Neuro:  Nonfocal  Psych: Normal affect   Labs    Chemistry Recent Labs  Lab 12/21/18 1401  12/22/18 0429 12/22/18 1125 12/23/18 0400 12/24/18 0602  NA 138   < > 139 141 139 139  K 4.0   < > 3.4* 3.5 3.7 3.7  CL 105  --  106  --  107 104  CO2 22  --  20*  --  24 26  GLUCOSE 87  --  93  --  92 98  BUN 13  --  12  --  9 9  CREATININE 0.97   < > 0.82  --  0.85 0.79  CALCIUM 8.6*  --  8.1*  --  8.9 8.9  PROT 7.1  --  6.3*  --   --    --   ALBUMIN 3.7  --  3.3*  --   --   --   AST 20  --  16  --   --   --   ALT 14  --  13  --   --   --   ALKPHOS 68  --  62  --   --   --   BILITOT 0.7  --  0.7  --   --   --   GFRNONAA >60  --  >60  --  >60 >60  GFRAA >60  --  >60  --  >60 >60  ANIONGAP 11  --  13  --  8 9   < > = values in this interval not displayed.     Hematology Recent Labs  Lab 12/22/18 0429 12/22/18 1125 12/23/18 0400 12/24/18 0602  WBC 12.9*  --  11.8* 11.5*  RBC 4.74  --  4.70 4.91  HGB 13.5 12.6* 14.0 14.0  HCT 40.6 37.0* 41.0 42.2  MCV 85.7  --  87.2 85.9  MCH 28.5  --  29.8 28.5  MCHC 33.3  --  34.1 33.2  RDW 13.1  --  13.4 13.0  PLT 282  --  270 285    Cardiac Enzymes Recent Labs  Lab 12/22/18 0429  TROPONINI <0.03    Recent Labs  Lab 12/21/18 1405  TROPIPOC 0.00     BNPNo results for input(s): BNP, PROBNP in the last 168 hours.   DDimer No results for input(s): DDIMER in the last 168 hours.   Radiology    Dg Shoulder Left Port  Result Date: 12/23/2018 CLINICAL DATA:  Shoulder pain. Recent stroke. EXAM: LEFT SHOULDER - 1 VIEW COMPARISON:  None. FINDINGS: There is no evidence of fracture or dislocation. There is mild glenohumeral osteoarthrosis. Soft tissues are unremarkable. IMPRESSION: No fracture or dislocation of the left shoulder. Electronically Signed   By: Deatra Robinson M.D.   On: 12/23/2018 19:48    Cardiac Studies     Patient Profile     67 y/o male with h/o CAD s/p previous NSTEMIs with stents to RCA and LAD in 2014 & 2015, ischemic CM EF 35% by echo in 2015, HTN, HL and obesity. 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 Echo with EF 35-40% with apical clot.  Assessment & Plan    2   CVA   Likely cardioembolic    Echo with apical thrombus   Anticoag per Neuro  2  Hx CAD   NSTEMI  2014   Promus stent to prox LAD NSTEMI in 2015   Resolute statnt to R PLA, R PDA, mid LAD  3   Chronic sytolic CHF    LVEFE  35 to 40% with apical thrombus.     On eliquis   On coreg   Try low dose cozaar so as not to lower bp too much      For questions or updates, please contact [facility] Please consult [email or web address] for contact info under        Signed, Dietrich Pates, MD  12/24/2018, 8:11 AM

## 2018-12-24 NOTE — Progress Notes (Signed)
Inpatient Diabetes Program Recommendations  AACE/ADA: New Consensus Statement on Inpatient Glycemic Control (2015)  Target Ranges:  Prepandial:   less than 140 mg/dL      Peak postprandial:   less than 180 mg/dL (1-2 hours)      Critically ill patients:  140 - 180 mg/dL   Lab Results  Component Value Date   GLUCAP 90 12/24/2018   HGBA1C 5.7 (H) 12/22/2018    Review of Glycemic Control  Diabetes history: None  Current orders for Inpatient glycemic control: ICU glycemic control order set Novolog 2-6 units Q4 hours  Inpatient Diabetes Program Recommendations:    A1c 5.7% on 4/4. Patient is ordered ICU Glycemic control protocol currently Novolog 2-6 units Q4 hours. Glucose trends 90-114. Could d/c Novolog insulin at this time and continue to check glucose.  Thanks,  Christena Deem RN, MSN, BC-ADM Inpatient Diabetes Coordinator Team Pager [phone] (8a-5p)

## 2018-12-24 NOTE — NC FL2 (Signed)
[place] MEDICAID FL2 LEVEL OF CARE SCREENING TOOL     IDENTIFICATION  Patient Name: Corey Meyers Kines Birthdate: 1951/05/29 Sex: male Admission Date (Current Location): 12/21/2018  [facility] and [place] Number:  Chiropodist and Address:  [facility] [place] [facility]. [facility], [address] [place], [place] [address]      Provider Number: [id number]  Attending Physician Name and Address:  Marvel Plan, MD  Relative Name and Phone Number:       Current Level of Care: Hospital Recommended Level of Care: Skilled Nursing Facility Prior Approval Number:    Date Approved/Denied:   PASRR Number: [id number]  Discharge Plan: SNF    Current Diagnoses: Patient Active Problem List   Diagnosis Date Noted  . Stroke (HCC) 12/21/2018  . 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    Orientation RESPIRATION BLADDER Height & Weight     Self, Place  Normal Continent Weight: 244 lb 14.9 oz (111.1 kg) Height:  6' (182.9 cm)  BEHAVIORAL SYMPTOMS/MOOD NEUROLOGICAL BOWEL NUTRITION STATUS      Continent Diet(see DC summary)  AMBULATORY STATUS COMMUNICATION OF NEEDS Skin   Limited Assist Verbally Normal                       Personal Care Assistance Level of Assistance  Bathing, Feeding, Dressing Bathing Assistance: Limited assistance Feeding assistance: Limited assistance Dressing Assistance: Limited assistance     Functional Limitations Info  Sight, Hearing, Speech Sight Info: Adequate Hearing Info: Adequate Speech Info: Impaired(dysarthria)    SPECIAL CARE FACTORS FREQUENCY  PT (By licensed PT), OT (By licensed OT)     PT Frequency: 5x/wk OT Frequency: 5x/wk            Contractures Contractures Info: Not present    Additional Factors Info  Code Status, Allergies, Insulin Sliding Scale Code Status Info: Full Allergies Info: NKA   Insulin Sliding Scale Info: 2-6 units every 4 hours       Current Medications (12/24/2018):  This is the current hospital  active medication list Current Facility-Administered Medications  Medication Dose Route Frequency Provider Last Rate Last Dose  . 0.9 %  sodium chloride infusion   Intravenous Continuous Bensimhon, Bevelyn Buckles, MD 10 mL/hr at 12/22/18 1351    . acetaminophen (TYLENOL) tablet 650 mg  650 mg Oral Q4H PRN Arnetha Courser, MD       Or  . acetaminophen (TYLENOL) solution 650 mg  650 mg Per Tube Q4H PRN Arnetha Courser, MD       Or  . acetaminophen (TYLENOL) suppository 650 mg  650 mg Rectal Q4H PRN Arnetha Courser, MD      . apixaban (ELIQUIS) tablet 5 mg  5 mg Oral BID Marvel Plan, MD      . atorvastatin (LIPITOR) tablet 80 mg  80 mg Oral q1800 Caryl Pina, MD   80 mg at 12/23/18 1745  . carvedilol (COREG) tablet 3.125 mg  3.125 mg Oral BID WC Caryl Pina, MD   3.125 mg at 12/24/18 0840  . feeding supplement (ENSURE ENLIVE) (ENSURE ENLIVE) liquid 237 mL  237 mL Oral Q1500 Marvel Plan, MD      . insulin aspart (novoLOG) injection 2-6 Units  2-6 Units Subcutaneous Q4H Kalman Shan, MD      . losartan (COZAAR) tablet 25 mg  25 mg Oral Daily Pricilla Riffle, MD      . pantoprazole (PROTONIX) EC tablet 40 mg  40 mg Oral Daily Lucia Gaskins,  Griselda Miner, MD   40 mg at 12/24/18 0840  . senna-docusate (Senokot-S) tablet 1 tablet  1 tablet Oral QHS PRN Arnetha Courser, MD         Discharge Medications: Please see discharge summary for a list of discharge medications.  Relevant Imaging Results:  Relevant Lab Results:   Additional Information SS#: [id number]  Baldemar Lenis, LCSW

## 2018-12-24 NOTE — Discharge Instructions (Signed)

## 2018-12-24 NOTE — Progress Notes (Signed)
Inpatient Rehabilitation Admissions Coordinator  Inpatient Rehab Consult received. I met with patient at the bedside for rehabilitation assessment.  Patient with aphasia. I  discussed goals and expectations of an inpatient rehab admission.  He did give me permission to contact his wife, Suanne Marker, by phone. Patient and his wife to do not live in the same home. She is unable to care for him in her home at d/c . SH would like to pursue SNF in the [place] area. I have updated RN CM, Kelli, and will sign off at this time.  Danne Baxter, RN, MSN Rehab Admissions Coordinator [phone] 12/24/2018 12:21 PM

## 2018-12-24 NOTE — Progress Notes (Signed)
Physical Therapy Treatment Patient Details Name: Corey Meyers MRN: [id number] DOB: June 14, 1951 Today's Date: 12/24/2018    History of Present Illness 68 y/o male with h/o CAD s/p previous NSTEMIs with stents to RCA and LAD in 2014 & 2015, ischemic CM EF 35% by echo in 2015, HTN, HL and obesity. 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 CT of head showing ischemic changes in the left MCA territory consistent with acute/subacute infarction. s/p clot extraction    PT Comments    Pt performed gait training without device and continues to require min assistance for gait training.  He presents with LOB but able to right during head turns.  He is slow to respond to commands.  Plan for aggressive rehab remains appropriate at [facility].  Plan next session for continued strengthening and higher level balance activities.      Follow Up Recommendations  CIR     Equipment Recommendations  (TBD)    Recommendations for Other Services Rehab consult     Precautions / Restrictions Precautions Precautions: Fall Restrictions Weight Bearing Restrictions: No    Mobility  Bed Mobility               General bed mobility comments: Pt seated in recliner on arrival.    Transfers Overall transfer level: Needs assistance Equipment used: None Transfers: Sit to/from Stand Sit to Stand: Min assist         General transfer comment: Min A for balance and to steady.  Ambulation/Gait Ambulation/Gait assistance: Min assist Gait Distance (Feet): 80 Feet Assistive device: 1 person hand held assist Gait Pattern/deviations: Step-through pattern;Decreased stride length;Shuffle Gait velocity: decreased   General Gait Details: patient with noted instability requiring hands on assist, easily distracted from task performance, multi modal cues for direction.    Stairs             Wheelchair Mobility    Modified Rankin (Stroke Patients Only)       Balance  Overall balance assessment: Needs assistance   Sitting balance-Leahy Scale: Good       Standing balance-Leahy Scale: Poor Standing balance comment: Pt presents with LOB during head turns and change in gt speed.                              Cognition Arousal/Alertness: Awake/alert Behavior During Therapy: Flat affect Overall Cognitive Status: Difficult to assess(due to aphasia)                                 General Comments: patient with limited communication, noted significant delayed response upwards of ~10 seconds at times. Requiring increased time and benefits from visual cues.       Exercises General Exercises - Lower Extremity Hip Flexion/Marching: AROM;Both;10 reps;Standing Toe Raises: AROM;Both;10 reps;Standing Mini-Sqauts: AROM;Both;10 reps;Standing    General Comments        Pertinent Vitals/Pain Pain Assessment: No/denies pain Faces Pain Scale: No hurt Pain Intervention(s): Monitored during session    Home Living                      Prior Function            PT Goals (current goals can now be found in the care plan section) Acute Rehab PT Goals Patient Stated Goal: Unstated Potential to Achieve Goals: Good Progress towards PT goals:  Progressing toward goals    Frequency    Min 4X/week      PT Plan Current plan remains appropriate    Co-evaluation              AM-PAC PT "6 Clicks" Mobility   Outcome Measure  Help needed turning from your back to your side while in a flat bed without using bedrails?: A Little Help needed moving from lying on your back to sitting on the side of a flat bed without using bedrails?: A Little Help needed moving to and from a bed to a chair (including a wheelchair)?: A Little Help needed standing up from a chair using your arms (e.g., wheelchair or bedside chair)?: A Little Help needed to walk in hospital room?: A Little Help needed climbing 3-5 steps with a railing? : A  Lot 6 Click Score: 17    End of Session Equipment Utilized During Treatment: Gait belt;Oxygen Activity Tolerance: Patient tolerated treatment well Patient left: in chair;with call bell/phone within reach;with chair alarm set Nurse Communication: Mobility status PT Visit Diagnosis: Other abnormalities of gait and mobility (R26.89);Other symptoms and signs involving the nervous system (R29.898)     Time: [id number] PT Time Calculation (min) (ACUTE ONLY): 14 min  Charges:  $Gait Training: 8-22 mins                     Joycelyn RuaAimee Damarie Schoolfield, PTA Acute Rehabilitation Services Pager [phone] Office [phone]     Alexx Giambra Artis DelayJ Vielka Klinedinst 12/24/2018, 4:12 PM

## 2018-12-24 NOTE — Progress Notes (Signed)
Nutrition Follow-up   RD working remotely.  DOCUMENTATION CODES:   Obesity unspecified  INTERVENTION:  Provide Ensure Enlive po once daily, each supplement provides 350 kcal and 20 grams of protein.  Encourage adequate PO intake.   NUTRITION DIAGNOSIS:   Inadequate oral intake related to inability to eat(pt sedated and ventilated ) as evidenced by NPO status; diet advanced; improved  GOAL:   Patient will meet greater than or equal to 90% of their needs; progressing  MONITOR:   PO intake, Labs, Weight trends, I & O's, Skin  REASON FOR ASSESSMENT:   Ventilator    ASSESSMENT:   68 yr male with obesity, HTN, HLD admitted with right sided weakness and aphasia. CTA showed occluded Left MCA M2 region and s/p endovascular revascularization of occluded inferior dvision.  Extubated 4/4.   Pt aphasic per MD. Pt is currently on a heart healthy diet. Meal completion has been 100%. Pt has been tolerating his diet. RD to order nutritional supplement to aid in caloric and protein needs.   Labs and medications reviewed.   Diet Order:   Diet Order            Diet Heart Room service appropriate? No; Fluid consistency: Thin  Diet effective now              EDUCATION NEEDS:   Not appropriate for education at this time  Skin:  Skin Assessment: Reviewed RN Assessment  Last BM:  Unknown  Height:   Ht Readings from Last 1 Encounters:  12/21/18 6' (1.829 m)    Weight:   Wt Readings from Last 1 Encounters:  12/21/18 111.1 kg    Ideal Body Weight:  80.9 kg  BMI:  Body mass index is 33.22 kg/m.  Estimated Nutritional Needs:   Kcal:  2100-2300  Protein:  105-115 grams  Fluid:  2.1 - 2.3 L/day    Roslyn Smiling, MS, RD, LDN Pager # [phone] After hours/ weekend pager # [phone]

## 2018-12-25 LAB — BASIC METABOLIC PANEL
Anion gap: 9 (ref 5–15)
BUN: 10 mg/dL (ref 8–23)
CO2: 24 mmol/L (ref 22–32)
Calcium: 9.2 mg/dL (ref 8.9–10.3)
Chloride: 106 mmol/L (ref 98–111)
Creatinine, Ser: 0.75 mg/dL (ref 0.61–1.24)
GFR calc Af Amer: >60 mL/min (ref >60)
GFR calc non Af Amer: >60 mL/min (ref >60)
Glucose, Bld: 105 mg/dL — ABNORMAL HIGH (ref 70–99)
Potassium: 3.7 mmol/L (ref 3.5–5.1)
Sodium: 139 mmol/L (ref 135–145)

## 2018-12-25 LAB — CBC WITH DIFFERENTIAL/PLATELET
Abs Immature Granulocytes: 0.02 10*3/uL (ref 0.00–0.07)
Basophils Absolute: 0 10*3/uL (ref 0.0–0.1)
Basophils Relative: 0 %
Eosinophils Absolute: 0.3 10*3/uL (ref 0.0–0.5)
Eosinophils Relative: 3 %
HCT: 41 % (ref 39.0–52.0)
Hemoglobin: 14.2 g/dL (ref 13.0–17.0)
Immature Granulocytes: 0 %
Lymphocytes Relative: 27 %
Lymphs Abs: 2.4 10*3/uL (ref 0.7–4.0)
MCH: 29.5 pg (ref 26.0–34.0)
MCHC: 34.6 g/dL (ref 30.0–36.0)
MCV: 85.1 fL (ref 80.0–100.0)
Monocytes Absolute: 0.9 10*3/uL (ref 0.1–1.0)
Monocytes Relative: 10 %
Neutro Abs: 5.3 10*3/uL (ref 1.7–7.7)
Neutrophils Relative %: 60 %
Platelets: 288 10*3/uL (ref 150–400)
RBC: 4.82 MIL/uL (ref 4.22–5.81)
RDW: 13 % (ref 11.5–15.5)
WBC: 9 10*3/uL (ref 4.0–10.5)
nRBC: 0 % (ref 0.0–0.2)

## 2018-12-25 LAB — GLUCOSE, CAPILLARY
Glucose-Capillary: 93 mg/dL (ref 70–99)
Glucose-Capillary: 107 mg/dL — ABNORMAL HIGH (ref 70–99)
Glucose-Capillary: 102 mg/dL — ABNORMAL HIGH (ref 70–99)
Glucose-Capillary: 86 mg/dL (ref 70–99)

## 2018-12-25 MED ORDER — INSULIN ASPART 100 UNIT/ML ~~LOC~~ SOLN
0.0000 [IU] | Freq: Three times a day (TID) | SUBCUTANEOUS | Status: DC
  Administered 2018-12-26: 1 [IU] via SUBCUTANEOUS

## 2018-12-25 NOTE — Progress Notes (Signed)
  Speech Language Pathology Treatment: Cognitive-Linquistic  Patient Details Name: Corey Meyers MRN: [id number] DOB: [birth year] Today's Date: 12/25/2018 Time: 1015-1030 SLP Time Calculation (min) (ACUTE ONLY): 15 min  Assessment / Plan / Recommendation Clinical Impression  Skilled treatment session focused on aphasia. SLP received pt upright in recliner. He began attempting to verbally communicate and point to items across room. SLP facilitated communication of wants and needs by asking yes/no questions. Pt was accurate in his responses and SLP able to figure out pt's communicative intentions. SLP also facilitated session by providing sentence completion cues for confrontation naming tasks to achieve ~ 50% accuracy. After naming tasks, pt with increased perseverative motor patterns that required Max A multimodal cues to break, suggestive of apraxia of speech. SLP provided support and education on recovery as pt became more frustrated with inability to communicate. ST will continue to follow for aphasia.    HPI HPI: Pt is a 68 y.o. male with PMhx significant for HTN, dyslipidemia and CAD with stents in right PDA and mid LAD who was brought to ED via EMS when found with altered mental status and unresponsive by wife around 1 PM on 12/21/18. CT of the head on 12/21/18 showed ischemic changes in the left MCA territory consistent with acute/subacute infarction. Involvement of the left caudate, lentiform nuclei, deep insula, internal capsule, M 5 and M 6 regions. Thrombectomy was conducted on 12/21/18 and MRI from 12/23/18 showed left MCA territory acute infarct without hemorrhage or significant mass effect. Punctate foci of acute ischemia within the left anterior cerebral artery territory.      SLP Plan  Continue with current plan of care       Recommendations                   General recommendations: Rehab consult Plan: Continue with current plan of care       GO                Corey Meyers 12/25/2018, 11:13 AM

## 2018-12-25 NOTE — TOC Benefit Eligibility Note (Signed)
Transition of Care [facility]) Benefit Eligibility Note    Patient Details  Name: Corey Meyers MRN: [id number] Date of Birth: 1951/07/23   Medication/Dose: Arne Cleveland  5 MG BID  Covered?: Yes  Tier: 3 Drug  Prescription Coverage Preferred Pharmacy: YES ([facility] AND [facility]   90 DAY SUPPLY FOR RETAIL $141.00)  Spoke with Person/Company/Phone Number:: JESSICA([employer]- D # [phone])  Co-Pay: $ 47.00  Prior Approval: No  Deductible: Met       Memory Argue Phone Number: 12/25/2018, 11:14 AM

## 2018-12-25 NOTE — Progress Notes (Signed)
Progress Note  Patient Name: Corey Meyers Date of Encounter: 12/25/2018  Primary Cardiologist: Paraschos  Subjective   Breathing is OK    No CP (pt nods)   Inpatient Medications    Scheduled Meds: . apixaban  5 mg Oral BID  . atorvastatin  80 mg Oral q1800  . carvedilol  3.125 mg Oral BID WC  . feeding supplement (ENSURE ENLIVE)  237 mL Oral Q1500  . insulin aspart  2-6 Units Subcutaneous Q4H  . losartan  25 mg Oral Daily  . pantoprazole  40 mg Oral Daily   Continuous Infusions: . sodium chloride 10 mL/hr at 12/22/18 1351   PRN Meds: acetaminophen **OR** acetaminophen (TYLENOL) oral liquid 160 mg/5 mL **OR** acetaminophen, senna-docusate   Vital Signs    Vitals:   12/24/18 1651 12/24/18 1940 12/24/18 2356 12/25/18 0353  BP: 129/88 109/70 100/63 107/69  Pulse: 83 75 76 80  Resp: 20 18 16 17   Temp: 98.7 F (37.1 C) 98.5 F (36.9 C) 99.2 F (37.3 C) 98.2 F (36.8 C)  TempSrc: Oral Oral Oral Oral  SpO2: 96% 98% 93% 99%  Weight:      Height:        Intake/Output Summary (Last 24 hours) at 12/25/2018 0740 Last data filed at 12/25/2018 1610 Gross per 24 hour  Intake 1080 ml  Output 2050 ml  Net -970 ml   Net neg 1.4 L  Last 3 Weights 12/21/2018 08/05/2017  Weight (lbs) 244 lb 14.9 oz 245 lb  Weight (kg) 111.1 kg 111.131 kg      Telemetry    SR  - Personally Reviewed  ECG     Physical Exam   GEN: No acute distress.   Neck: No JVD GI:  NOt distended    MS: No edema; No deformity. Neuro: Deferred  Labs    Chemistry Recent Labs  Lab 12/21/18 1401  12/22/18 0429  12/23/18 0400 12/24/18 0602 12/25/18 0546  NA 138   < > 139   < > 139 139 139  K 4.0   < > 3.4*   < > 3.7 3.7 3.7  CL 105  --  106  --  107 104 106  CO2 22  --  20*  --  24 26 24   GLUCOSE 87  --  93  --  92 98 105*  BUN 13  --  12  --  9 9 10   CREATININE 0.97   < > 0.82  --  0.85 0.79 0.75  CALCIUM 8.6*  --  8.1*  --  8.9 8.9 9.2  PROT 7.1  --  6.3*  --   --   --   --   ALBUMIN 3.7   --  3.3*  --   --   --   --   AST 20  --  16  --   --   --   --   ALT 14  --  13  --   --   --   --   ALKPHOS 68  --  62  --   --   --   --   BILITOT 0.7  --  0.7  --   --   --   --   GFRNONAA >60  --  >60  --  >60 >60 >60  GFRAA >60  --  >60  --  >60 >60 >60  ANIONGAP 11  --  13  --  8 9 9    < > =  values in this interval not displayed.     Hematology Recent Labs  Lab 12/23/18 0400 12/24/18 0602 12/25/18 0546  WBC 11.8* 11.5* 9.0  RBC 4.70 4.91 4.82  HGB 14.0 14.0 14.2  HCT 41.0 42.2 41.0  MCV 87.2 85.9 85.1  MCH 29.8 28.5 29.5  MCHC 34.1 33.2 34.6  RDW 13.4 13.0 13.0  PLT 270 285 288    Cardiac Enzymes Recent Labs  Lab 12/22/18 0429  TROPONINI <0.03    Recent Labs  Lab 12/21/18 1405  TROPIPOC 0.00     BNPNo results for input(s): BNP, PROBNP in the last 168 hours.   DDimer No results for input(s): DDIMER in the last 168 hours.   Radiology    Dg Shoulder Left Port  Result Date: 12/23/2018 CLINICAL DATA:  Shoulder pain. Recent stroke. EXAM: LEFT SHOULDER - 1 VIEW COMPARISON:  None. FINDINGS: There is no evidence of fracture or dislocation. There is mild glenohumeral osteoarthrosis. Soft tissues are unremarkable. IMPRESSION: No fracture or dislocation of the left shoulder. Electronically Signed   By: Deatra RobinsonKevin  Herman M.D.   On: 12/23/2018 19:48    Cardiac Studies     Patient Profile     68 y/o male with h/o CAD s/p previous NSTEMIs with stents to RCA and LAD in 2014 & 2015, ischemic CM EF 35% by echo in 2015, HTN, HL and obesity. 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 Echo with EF 35-40% with apical clot.  Assessment & Plan    2   CVA   Likely cardioembolic    Echo with apical thrombus  Given current conditions would use Eliquis and not coumadin      2  Hx CAD   NSTEMI  2014   Promus stent to prox LAD NSTEMI in 2015   Resolute statnt to R PLA, R PDA, mid LAD No active angina  3   Chronic  sytolic CHF    LVEFE 35 to 40% with apical thrombus.     On eliquis   On coreg   Added low dose Cozaar   Watch BP today    I would not titrate cardiac meds further   Follow BP He will f/u with primary cardiologiost after d/c Will sign off  Will make sure that office iscontacted     For questions or updates, please contact [facility] Please consult [email or web address] for contact info under        Signed, Dietrich PatesPaula Carrol Bondar, MD  12/25/2018, 7:40 AM

## 2018-12-25 NOTE — Progress Notes (Signed)
Physical Therapy Treatment Patient Details Name: Corey Meyers MRN: [id number] DOB: 06-20-1951 Today's Date: 12/25/2018    History of Present Illness 68 y/o male with h/o CAD s/p previous NSTEMIs with stents to RCA and LAD in 2014 & 2015, ischemic CM EF 35% by echo in 2015, HTN, HL and obesity. 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 CT of head showing ischemic changes in the left MCA territory consistent with acute/subacute infarction. s/p clot extraction    PT Comments    Pt progressing well and requring decreased assistance.  He continues to present with minor balance impairments during high level balance activities.  Pt largely limited due to expressive aphasia.  Plan for progression of gait training to stairs next session.  Pt continues to benefit from skilled rehab in a post acute inpatient rehab center to improve coordination and balance before returning home.    Follow Up Recommendations  CIR     Equipment Recommendations  (TBD)    Recommendations for Other Services Rehab consult     Precautions / Restrictions Precautions Precautions: Fall Restrictions Weight Bearing Restrictions: No    Mobility  Bed Mobility Overal bed mobility: Needs Assistance Bed Mobility: Supine to Sit     Supine to sit: Supervision     General bed mobility comments: Cues for safety no physical assistance needed.   Transfers Overall transfer level: Needs assistance Equipment used: None Transfers: Sit to/from Stand Sit to Stand: Min guard         General transfer comment: MIn guard to come to standing.    Ambulation/Gait Ambulation/Gait assistance: Min guard Gait Distance (Feet): 120 Feet Assistive device: None Gait Pattern/deviations: Step-through pattern;Decreased stride length;Shuffle;Drifts right/left Gait velocity: decreased   General Gait Details: Minor drifting with balance challenges.  Pt with improved cadence.  Impairments noted more  with turning and hand difficulty following commands to step over object.     Stairs             Wheelchair Mobility    Modified Rankin (Stroke Patients Only) Modified Rankin (Stroke Patients Only) Pre-Morbid Rankin Score: No symptoms Modified Rankin: Moderately severe disability     Balance Overall balance assessment: Needs assistance   Sitting balance-Leahy Scale: Normal     Standing balance support: No upper extremity supported;During functional activity Standing balance-Leahy Scale: Good Standing balance comment: min guard for safety within base of support             High level balance activites: Side stepping;Backward walking;Direction changes;Turns;Sudden stops;Head turns;Other (comment)(x15 ft each activity. Performed sidestepping to L and R with more deficits noted stepping to R.  ) High Level Balance Comments: Standing marching (without UE support), standing heel raises (with unilateral support) Standardized Balance Assessment Standardized Balance Assessment : (Performed DGI without trialing stairs, Pt presents with mild to moderate impairments, did not score because of incomplete test.  )          Cognition Arousal/Alertness: Awake/alert Behavior During Therapy: Flat affect Overall Cognitive Status: Difficult to assess                                 General Comments: slow processing, difficult to assess due to aphasia      Exercises General Exercises - Lower Extremity Ankle Circles/Pumps: AROM;Both;20 reps;Supine Quad Sets: AROM;Both;10 reps;Supine    General Comments        Pertinent Vitals/Pain Pain Assessment: Faces Faces  Pain Scale: Hurts a little bit Pain Location: L shoulder with movement into flexion.  Pain Descriptors / Indicators: Discomfort;Grimacing;Guarding Pain Intervention(s): Monitored during session;Repositioned(performed ROM)    Home Living                      Prior Function            PT  Goals (current goals can now be found in the care plan section) Acute Rehab PT Goals Patient Stated Goal: Unstated Potential to Achieve Goals: Good Progress towards PT goals: Progressing toward goals    Frequency    Min 4X/week      PT Plan Current plan remains appropriate    Co-evaluation              AM-PAC PT "6 Clicks" Mobility   Outcome Measure  Help needed turning from your back to your side while in a flat bed without using bedrails?: None Help needed moving from lying on your back to sitting on the side of a flat bed without using bedrails?: A Little Help needed moving to and from a bed to a chair (including a wheelchair)?: A Little Help needed standing up from a chair using your arms (e.g., wheelchair or bedside chair)?: A Little Help needed to walk in hospital room?: A Little Help needed climbing 3-5 steps with a railing? : A Little 6 Click Score: 19    End of Session Equipment Utilized During Treatment: Gait belt;Oxygen Activity Tolerance: Patient tolerated treatment well Patient left: in chair;with call bell/phone within reach;with chair alarm set Nurse Communication: Mobility status PT Visit Diagnosis: Other abnormalities of gait and mobility (R26.89);Other symptoms and signs involving the nervous system (Z61.096(R29.898)     Time: [id number] PT Time Calculation (min) (ACUTE ONLY): 24 min  Charges:  $Gait Training: 8-22 mins $Neuromuscular Re-education: 8-22 mins                     Corey Meyers, PTA Acute Rehabilitation Services Pager [phone] Office [phone]     Corey Meyers Artis DelayJ Kriston Pasquarello 12/25/2018, 10:08 AM

## 2018-12-25 NOTE — Progress Notes (Signed)
STROKE TEAM PROGRESS NOTE   SUBJECTIVE (INTERVAL HISTORY) Patient sitting in chair, having lunch.  Still has expressive aphasia.  Patient is frustrated due to aphasia.  Social worker is working on placement.  Cardiology added low-dose Cozaar for CHF.  OBJECTIVE Vitals:   12/24/18 2356 12/25/18 0353 12/25/18 0800 12/25/18 1100  BP: 100/63 107/69 113/85 118/82  Pulse: 76 80 79 88  Resp: 16 17 18 18   Temp: 99.2 F (37.3 C) 98.2 F (36.8 C) 98.1 F (36.7 C) 98.2 F (36.8 C)  TempSrc: Oral Oral Oral Oral  SpO2: 93% 99% 97% 98%  Weight:      Height:        CBC:  Recent Labs  Lab 12/24/18 0602 12/25/18 0546  WBC 11.5* 9.0  NEUTROABS 7.6 5.3  HGB 14.0 14.2  HCT 42.2 41.0  MCV 85.9 85.1  PLT 285 288    Basic Metabolic Panel:  Recent Labs  Lab 12/22/18 0429  12/23/18 0400 12/24/18 0602 12/25/18 0546  NA 139   < > 139 139 139  K 3.4*   < > 3.7 3.7 3.7  CL 106  --  107 104 106  CO2 20*  --  24 26 24   GLUCOSE 93  --  92 98 105*  BUN 12  --  9 9 10   CREATININE 0.82  --  0.85 0.79 0.75  CALCIUM 8.1*  --  8.9 8.9 9.2  MG 1.8  --  2.0  --   --   PHOS 2.7  --  2.9  --   --    < > = values in this interval not displayed.    Lipid Panel:     Component Value Date/Time   CHOL 165 12/22/2018 0429   CHOL 134 02/12/2014 0210   TRIG 131 12/22/2018 0429   TRIG 122 02/12/2014 0210   HDL 34 (L) 12/22/2018 0429   HDL 36 (L) 02/12/2014 0210   CHOLHDL 4.9 12/22/2018 0429   VLDL 26 12/22/2018 0429   VLDL 24 02/12/2014 0210   LDLCALC 105 (H) 12/22/2018 0429   LDLCALC 74 02/12/2014 0210   HgbA1c:  Lab Results  Component Value Date   HGBA1C 5.7 (H) 12/22/2018   Urine Drug Screen:     Component Value Date/Time   LABOPIA NONE DETECTED 12/23/2018 1332   COCAINSCRNUR NONE DETECTED 12/23/2018 1332   LABBENZ POSITIVE (A) 12/23/2018 1332   AMPHETMU NONE DETECTED 12/23/2018 1332   THCU NONE DETECTED 12/23/2018 1332   LABBARB NONE DETECTED 12/23/2018 1332    Alcohol Level      Component Value Date/Time   ETH <10 12/21/2018 1410    IMAGING Ct Head Code Stroke Wo Contrast 12/21/2018 IMPRESSION:  1. Ischemic changes in the left MCA territory consistent with acute/subacute infarction. Involvement of the left caudate, lentiform nuclei, deep insula, internal capsule, M 5 and M 6 regions. No sign of hemorrhage or mass effect.  2. ASPECTS is 4   Ct Angio Head W Or Wo Contrast Ct Angio Neck W Or Wo Contrast Ct Cerebral Perfusion W Contrast 12/21/2018 IMPRESSION:  Embolic occlusion of a left M2 branch. T-max greater than 6 seconds in the left posterior frontal and parietal cortical and subcortical brain, without evidence of actual completed infarction by perfusion imaging. The findings in the basal ganglia and insula must relate to an old insult. I am somewhat surprised at the well visible low-density in the area of penumbra. However, given all this data, patient would seem to be  a candidate for intervention.   Interventional Radiology - Cerebral Angiogram with Intervention - Dr Corliss Skains S/P lt common carotid arteriogram followed by complete revascularization of occluded M 2  Branch of  Lt MCA inf division  Achieving a TICI 2b revascularization.  Mr Brain Wo Contrast 12/22/2018 IMPRESSION:  1. Intermediate volume left MCA territory acute infarct without hemorrhage or significant mass effect.  2. Punctate foci of acute ischemia within the left anterior cerebral artery territory.   Dg Chest Port 1 View 12/21/2018 IMPRESSION:  Hazy left lower lobe airspace disease which may reflect atelectasis versus pneumonia.   Transthoracic Echocardiogram  12/22/2018 IMPRESSIONS  1. Mild hypokinesis of the left ventricular, entire inferior wall.  2. Moderate hypokinesis of the left ventricular, entire anterior wall.  3. Severe akinesis of the left ventricular, entire apical segment.  4. Severe akinesis of the left ventricular, mid-apical anteroseptal wall.  5. Moderate, fixed  thrombus on the apical wall of the left ventricle.  6. The left ventricle has moderately reduced systolic function, with an ejection fraction of 35-40%. The cavity size was normal. Left ventricular diastolic function could not be evaluated.  7. The right ventricle has normal systolic function. The cavity was normal. There is no increase in right ventricular wall thickness. Right ventricular systolic pressure could not be assessed.  8. Left atrial size was not assessed.  9. The pericardium was not assessed. 10. The aortic valve is grossly normal. 11. When compared to the prior study: Compared to prior reports in Care Everywhere, LV clot is new. No images for direct comparison. 12. The interatrial septum was not assessed.  EKG - SR rate 79 BPM. (See cardiology reading for complete details)   PHYSICAL EXAM  Temp:  [98.1 F (36.7 C)-99.2 F (37.3 C)] 98.2 F (36.8 C) (04/07 1100) Pulse Rate:  [75-88] 88 (04/07 1100) Resp:  [16-20] 18 (04/07 1100) BP: (100-129)/(63-88) 118/82 (04/07 1100) SpO2:  [93 %-99 %] 98 % (04/07 1100)  General - Well nourished, well developed, in no apparent distress.  Ophthalmologic - fundi not visualized due to noncooperation.  Cardiovascular - Regular rate and rhythm.  Neuro - awake alert, Orientated to name, age and place.  Expressive aphasia, following simple commands.  Not able to name or repeat.  PERRL, EOMI, visual field full.  No significant facial asymmetry, tongue midline.  Moving extremity equally except left pronator drift due to left shoulder pain.  Sensation symmetrical, DTR 1+, no Babinski.  Coordination intact except left finger-to-nose slow due to left shoulder pain.  Gait not tested   ASSESSMENT/PLAN [name] is a 68 y.o. male with history of HTN, dyslipidemia and CAD with stents found minimally responsive, aphasic, except for a few mumbles, overall weak, more pronounced on right and not following commands. He did not receive IV t-PA due  to late presentation. S/P lt common carotid arteriogram followed by complete revascularization of occluded M 2  Branch of  Lt MCA inf division  Achieving a TICI 2b revascularization.   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  Resultant expressive aphasia  CT head - Ischemic changes in the left MCA territory consistent with acute/subacute infarction.  MRI head -  Intermediate volume left MCA territory acute infarct. Punctate foci of acute ischemia within the left ACA territory.   CTA H&N - Embolic occlusion of a left M2 branch. Basal ganglia and insula must relate to an old insult.  2D Echo -  35-40% - Moderate, fixed thrombus on the apical wall of the left ventricle.  LDL - 105  HgbA1c - 5.7  VTE prophylaxis - SCDs  aspirin 81 mg daily and Brilinta (ticagrelor) 90 mg bid prior to admission, now on eliquis due to apical thrombus in the left ventricle  Therapy recommendations:  CIR-> SNF (no assist at discharge)  Disposition:  Pending  LV thrombus  Echo showed EF 35 to 40% with LV apical thrombus  Cardiology on board  On Eliquis  Hypertension  Stable . Post procedure BP < 160 . Cardiology recommends to continue current meds at d/c w/o titrating futher . Long-term BP goal normotensive  Hyperlipidemia  Lipid lowering medication PTA:  Lipitor 80 mg daily  LDL 105, goal < 70  Current lipid lowering medication: Lipitor 80 mg daily  Continue statin at discharge  CAD/non-STEMI Chronic systolic CHF  On aspirin and Brilinta PTA  Cardiology on board  Brilinta stopped as per cardiology given need for [facility]  On coreg and low-dose Cozaar  Other Stroke Risk Factors  Advanced age  Former cigarette smoker - quit  ETOH use, advised to drink no more than 1 alcoholic beverage per day.  Obesity, Body mass index is 33.22 kg/m., recommend weight loss, diet and exercise as appropriate   Hx stroke/TIA - by imaging  Other  Active Problems  Mild hypokalemia - 3.7  Abnormal CXR - Hazy left lower lobe airspace disease which may reflect atelectasis versus pneumonia.   Mild Leukocytosis 12.9 (afebrile) ->9.0  Left shoulder pain - x-ray no fx or dislocation  Hospital day # 4  Marvel Plan, MD PhD Stroke Neurology 12/25/2018 3:39 PM   To contact Stroke Continuity provider, please refer to [email or web address]. After hours, contact General Neurology

## 2018-12-25 NOTE — Progress Notes (Signed)
Occupational Therapy Treatment Patient Details Name: Corey Meyers MRN: [id number] DOB: [birth year] Today's Date: 12/25/2018    History of present illness 68 y/o male with h/o CAD s/p previous NSTEMIs with stents to RCA and LAD in 2014 & 2015, ischemic CM EF 35% by echo in 2015, HTN, HL and obesity. 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 CT of head showing ischemic changes in the left MCA territory consistent with acute/subacute infarction. s/p clot extraction   OT comments  Pt progressing towards established OT goals. Pt requiring Min Guard A throughout session for safety in standing. Pt performing three part trail making task recalling 2/3 locations; requiring cues to recall the second location. Pt performing simple IADL task to make coffee at sink. Pt presenting with poor awareness and problem solving of errors to prevent spilling once coffee was made. Continue to recommend dc to CIR and will continue to follow acutely as admitted.    Follow Up Recommendations  CIR;Supervision/Assistance - 24 hour    Equipment Recommendations  Other (comment)(TBD)    Recommendations for Other Services Rehab consult;PT consult;Speech consult    Precautions / Restrictions Precautions Precautions: Fall Restrictions Weight Bearing Restrictions: No       Mobility Bed Mobility               General bed mobility comments: In recliner upon arrival  Transfers Overall transfer level: Needs assistance Equipment used: None Transfers: Sit to/from Stand Sit to Stand: Min guard         General transfer comment: Min Guard A for safety    Balance Overall balance assessment: Needs assistance Sitting-balance support: No upper extremity supported;Feet supported Sitting balance-Leahy Scale: Normal     Standing balance support: No upper extremity supported;During functional activity Standing balance-Leahy Scale: Good Standing balance comment: min guard for  safety within base of support                           ADL either performed or assessed with clinical judgement   ADL Overall ADL's : Needs assistance/impaired                     Lower Body Dressing: Min guard;Sit to/from stand Lower Body Dressing Details (indicate cue type and reason): Adjusting socks             Functional mobility during ADLs: Min guard General ADL Comments: Pt performing simple meal prep task by making coffee at sink. demonstrating decreased awareness to anticipate errors and problem solve management of errors. Becoming very frustrated with aphasia     Vision       Perception     Praxis      Cognition Arousal/Alertness: Awake/alert Behavior During Therapy: Flat affect Overall Cognitive Status: Difficult to assess Area of Impairment: Attention;Safety/judgement;Awareness;Problem solving;Memory;Following commands                   Current Attention Level: Selective Memory: Decreased short-term memory Following Commands: Follows one step commands consistently;Follows multi-step commands inconsistently Safety/Judgement: Decreased awareness of safety;Decreased awareness of deficits Awareness: Emergent Problem Solving: Slow processing;Decreased initiation;Difficulty sequencing;Requires verbal cues;Requires tactile cues General Comments: Pt performing a three part trail making task and recall first location and third location, but forgetting second location. Once pt navigated to last location, cued pt that he forgot second and he was then able to navigate to second and third locations. Having pt perform a simple meal  prep task and pt making coffee from pitcher with Min guard A for safety. Requiring increased time for processing. Once coffee was made, pt with decreased awareness to anticipate problems or errors with spilling coffee and required cues; pt spilling coffee slightly twice        Exercises     Shoulder Instructions        General Comments Pt asking "what time is it?" Finding clock to place in patient's room and he was very thankful    Pertinent Vitals/ Pain       Pain Assessment: Faces Faces Pain Scale: Hurts a little bit Pain Location: L shoulder with movement into flexion.  Pain Descriptors / Indicators: Discomfort;Grimacing;Guarding Pain Intervention(s): Monitored during session;Limited activity within patient's tolerance;Repositioned  Home Living                                          Prior Functioning/Environment              Frequency  Min 3X/week        Progress Toward Goals  OT Goals(current goals can now be found in the care plan section)  Progress towards OT goals: Progressing toward goals  Acute Rehab OT Goals Patient Stated Goal: Unstated OT Goal Formulation: Patient unable to participate in goal setting Time For Goal Achievement: 01/05/19 Potential to Achieve Goals: Good ADL Goals Pt Will Perform Grooming: with modified independence;standing Pt Will Perform Upper Body Dressing: with modified independence;sitting Pt Will Perform Lower Body Dressing: with modified independence;sit to/from stand Pt Will Transfer to Toilet: with modified independence;ambulating;regular height toilet Pt/caregiver will Perform Home Exercise Program: Increased ROM;Increased strength;Right Upper extremity;With Supervision;With written HEP provided Additional ADL Goal #1: Pt will demonstrate selective attention during ADL in distracting environment with 1-2 cues Additional ADL Goal #2: Pt will follow 75% of verbal cues during simple ADL  Plan Discharge plan remains appropriate;Frequency remains appropriate    Co-evaluation                 AM-PAC OT "6 Clicks" Daily Activity     Outcome Measure   Help from another person eating meals?: A Little Help from another person taking care of personal grooming?: A Little Help from another person toileting, which  includes using toliet, bedpan, or urinal?: A Little Help from another person bathing (including washing, rinsing, drying)?: A Little Help from another person to put on and taking off regular upper body clothing?: A Little Help from another person to put on and taking off regular lower body clothing?: A Little 6 Click Score: 18    End of Session Equipment Utilized During Treatment: Gait belt  OT Visit Diagnosis: Unsteadiness on feet (R26.81);Other abnormalities of gait and mobility (R26.89);Muscle weakness (generalized) (M62.81);Other symptoms and signs involving cognitive function;Hemiplegia and hemiparesis Hemiplegia - Right/Left: Right Hemiplegia - dominant/non-dominant: Dominant Hemiplegia - caused by: Cerebral infarction   Activity Tolerance Patient tolerated treatment well   Patient Left in chair;with call bell/phone within reach;with chair alarm set   Nurse Communication Mobility status        Time: 9604-5409 OT Time Calculation (min): 31 min  Charges: OT General Charges $OT Visit: 1 Visit OT Treatments $Self Care/Home Management : 8-22 mins $Cognitive Funtion inital: Initial 15 mins  Corey Meyers MSOT, OTR/L Acute Rehab Pager: [phone] Office: [phone]   Theodoro Grist Moneka Mcquinn 12/25/2018, 3:49 PM

## 2018-12-26 ENCOUNTER — Other Ambulatory Visit: Payer: Self-pay

## 2018-12-26 LAB — GLUCOSE, CAPILLARY
Glucose-Capillary: 93 mg/dL (ref 70–99)
Glucose-Capillary: 141 mg/dL — ABNORMAL HIGH (ref 70–99)
Glucose-Capillary: 99 mg/dL (ref 70–99)
Glucose-Capillary: 111 mg/dL — ABNORMAL HIGH (ref 70–99)

## 2018-12-26 NOTE — Progress Notes (Signed)
  Speech Language Pathology Treatment: Cognitive-Linquistic(Aphasia)  Patient Details Name: Corey Meyers MRN: [id number] DOB: [birth year] Today's Date: 12/26/2018 Time: 2563-8937 SLP Time Calculation (min) (ACUTE ONLY): 23 min  Assessment / Plan / Recommendation Clinical Impression  Pt was seen for aphasia treatment and he continues to demonstrate improvement compared to his initial evaluation but his level of frustration persists. He demonstrated 70% accuracy with confrontational naming increasing to 90% accuracy with semantic and phonemic cues. He achieved 50% accuracy with sentence completion increasing to 90% accuracy with minimal phonemic cues. He required a single phonemic cue for counting up to 20 but moderate cues were needed for production of the days of the week and mod-max cues for the months of the year. Responsive naming tasks were attempted with 25% accuracy increasing to 75% with mod cues. SLP will continue to follow pt.    HPI HPI: Pt is a 68 y.o. male with PMhx significant for HTN, dyslipidemia and CAD with stents in right PDA and mid LAD who was brought to ED via EMS when found with altered mental status and unresponsive by wife around 1 PM on 12/21/18. CT of the head on 12/21/18 showed ischemic changes in the left MCA territory consistent with acute/subacute infarction. Involvement of the left caudate, lentiform nuclei, deep insula, internal capsule, M 5 and M 6 regions. Thrombectomy was conducted on 12/21/18 and MRI from 12/23/18 showed left MCA territory acute infarct without hemorrhage or significant mass effect. Punctate foci of acute ischemia within the left anterior cerebral artery territory.      SLP Plan  Continue with current plan of care       Recommendations                   Follow up Recommendations: Inpatient Rehab SLP Visit Diagnosis: Aphasia (R47.01) Plan: Continue with current plan of care       Natallie Ravenscroft I. Vear Clock, MS, CCC-SLP Acute Rehabilitation  Services Office number [phone] Pager [phone]                Scheryl Marten 12/26/2018, 4:02 PM

## 2018-12-26 NOTE — Progress Notes (Signed)
WOuld keep on current medical regimen Will contact primary cardiologist (Paraschos) for f/u after discharge    WIll sign off.  Call with questions.

## 2018-12-26 NOTE — Progress Notes (Signed)
STROKE TEAM PROGRESS NOTE   SUBJECTIVE (INTERVAL HISTORY) Pt lying in bed, no neuro changes, still has expressive aphasia. Pending SNF.  OBJECTIVE Vitals:   12/25/18 2339 12/26/18 0000 12/26/18 0354 12/26/18 0736  BP: 99/62  98/64 107/72  Pulse: 87  82 81  Resp:  Temp:   98 F (36.7 C) 98 F (36.7 C)  TempSrc:   Oral Oral  SpO2:   97% 93%  Weight:      Height:        CBC:  Recent Labs  Lab 12/24/18 0602 12/25/18 0546  WBC 11.5* 9.0  NEUTROABS 7.6 5.3  HGB 14.0 14.2  HCT 42.2 41.0  MCV 85.9 85.1  PLT 285 288    Basic Metabolic Panel:  Recent Labs  Lab 12/22/18 0429  12/23/18 0400 12/24/18 0602 12/25/18 0546  NA 139   < > 139 139 139  K 3.4*   < > 3.7 3.7 3.7  CL 106  --  107 104 106  CO2 20*  --  GLUCOSE 93  --  92 98 105*  BUN 12  --  CREATININE 0.82  --  0.85 0.79 0.75  CALCIUM 8.1*  --  8.9 8.9 9.2  MG 1.8  --  2.0  --   --   PHOS 2.7  --  2.9  --   --    < > = values in this interval not displayed.    Lipid Panel:     Component Value Date/Time   CHOL 165 12/22/2018 0429   CHOL 134 02/12/2014 0210   TRIG 131 12/22/2018 0429   TRIG 122 02/12/2014 0210   HDL 34 (L) 12/22/2018 0429   HDL 36 (L) 02/12/2014 0210   CHOLHDL 4.9 12/22/2018 0429   VLDL 26 12/22/2018 0429   VLDL 24 02/12/2014 0210   LDLCALC 105 (H) 12/22/2018 0429   LDLCALC 74 02/12/2014 0210   HgbA1c:  Lab Results  Component Value Date   HGBA1C 5.7 (H) 12/22/2018   Urine Drug Screen:     Component Value Date/Time   LABOPIA NONE DETECTED 12/23/2018 1332   COCAINSCRNUR NONE DETECTED 12/23/2018 1332   LABBENZ POSITIVE (A) 12/23/2018 1332   AMPHETMU NONE DETECTED 12/23/2018 1332   THCU NONE DETECTED 12/23/2018 1332   LABBARB NONE DETECTED 12/23/2018 1332    Alcohol Level     Component Value Date/Time   ETH <10 12/21/2018 1410    IMAGING Ct Head Code Stroke Wo Contrast 12/21/2018 IMPRESSION:  1. Ischemic changes in the left MCA territory  consistent with acute/subacute infarction. Involvement of the left caudate, lentiform nuclei, deep insula, internal capsule, M 5 and M 6 regions. No sign of hemorrhage or mass effect.  2. ASPECTS is 4   Ct Angio Head W Or Wo Contrast Ct Angio Neck W Or Wo Contrast Ct Cerebral Perfusion W Contrast 12/21/2018 IMPRESSION:  Embolic occlusion of a left M2 branch. T-max greater than 6 seconds in the left posterior frontal and parietal cortical and subcortical brain, without evidence of actual completed infarction by perfusion imaging. The findings in the basal ganglia and insula must relate to an old insult. I am somewhat surprised at the well visible low-density in the area of penumbra. However, given all this data, patient would seem to be a candidate for intervention.   Interventional Radiology - Cerebral Angiogram with Intervention - Dr Corliss Skains S/P lt common carotid arteriogram followed by complete revascularization  of occluded M 2  Branch of  Lt MCA inf division  Achieving a TICI 2b revascularization.  Mr Brain Wo Contrast 12/22/2018 IMPRESSION:  1. Intermediate volume left MCA territory acute infarct without hemorrhage or significant mass effect.  2. Punctate foci of acute ischemia within the left anterior cerebral artery territory.   Dg Chest Port 1 View 12/21/2018 IMPRESSION:  Hazy left lower lobe airspace disease which may reflect atelectasis versus pneumonia.   Transthoracic Echocardiogram  12/22/2018 IMPRESSIONS  1. Mild hypokinesis of the left ventricular, entire inferior wall.  2. Moderate hypokinesis of the left ventricular, entire anterior wall.  3. Severe akinesis of the left ventricular, entire apical segment.  4. Severe akinesis of the left ventricular, mid-apical anteroseptal wall.  5. Moderate, fixed thrombus on the apical wall of the left ventricle.  6. The left ventricle has moderately reduced systolic function, with an ejection fraction of 35-40%. The cavity size was  normal. Left ventricular diastolic function could not be evaluated.  7. The right ventricle has normal systolic function. The cavity was normal. There is no increase in right ventricular wall thickness. Right ventricular systolic pressure could not be assessed.  8. Left atrial size was not assessed.  9. The pericardium was not assessed. 10. The aortic valve is grossly normal. 11. When compared to the prior study: Compared to prior reports in Care Everywhere, LV clot is new. No images for direct comparison. 12. The interatrial septum was not assessed.  EKG - SR rate 79 BPM. (See cardiology reading for complete details)   PHYSICAL EXAM  Temp:  [98 F (36.7 C)-98.1 F (36.7 C)] 98 F (36.7 C) (04/08 0736) Pulse Rate:  [81-90] 81 (04/08 0736) Resp:  [18-20] 20 (04/08 0736) BP: (76-113)/(58-84) 107/72 (04/08 0736) SpO2:  [93 %-98 %] 93 % (04/08 0736)  General - Well nourished, well developed, in no apparent distress.  Ophthalmologic - fundi not visualized due to noncooperation.  Cardiovascular - Regular rate and rhythm.  Neuro - awake alert, Orientated to name, age and place.  Expressive aphasia, following simple commands.  Not able to name or repeat.  PERRL, EOMI, visual field full.  No significant facial asymmetry, tongue midline.  Moving extremity equally except left pronator drift due to left shoulder pain.  Sensation symmetrical, DTR 1+, no Babinski.  Coordination intact except left finger-to-nose slow due to left shoulder pain.  Gait not tested   ASSESSMENT/PLAN [name] is a 68 y.o. male with history of HTN, dyslipidemia and CAD with stents found minimally responsive, aphasic, except for a few mumbles, overall weak, more pronounced on right and not following commands. He did not receive IV t-PA due to late presentation. S/P lt common carotid arteriogram followed by complete revascularization of occluded M 2  Branch of  Lt MCA inf division  Achieving a TICI 2b  revascularization.   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  Resultant expressive aphasia  CT head - Ischemic changes in the left MCA territory consistent with acute/subacute infarction.  MRI head -  Intermediate volume left MCA territory acute infarct. Punctate foci of acute ischemia within the left ACA territory.   CTA H&N - Embolic occlusion of a left M2 branch. Basal ganglia and insula must relate to an old insult.  2D Echo - 35-40% - Moderate, fixed thrombus on the apical wall of the left ventricle.  LDL - 105  HgbA1c - 5.7  VTE prophylaxis -  SCDs  aspirin 81 mg daily and Brilinta (ticagrelor) 90 mg bid prior to admission, now on eliquis due to apical thrombus in the left ventricle  Therapy recommendations:  CIR-> SNF (no assist at discharge)  Disposition:  Pending  LV thrombus  Echo showed EF 35 to 40% with LV apical thrombus  Cardiology signed off  On Eliquis  Follow up with cardiology as outpt  Hypertension  Stable on the low side . Cardiology recommends to continue current meds at d/c w/o titrating futher . F/u primary cardiologist Paraschos at d/c . Long term BP goal normotensive  Hyperlipidemia  Lipid lowering medication PTA:  Lipitor 80 mg daily  LDL 105, goal < 70  Current lipid lowering medication: Lipitor 80 mg daily  Continue statin at discharge  CAD/non-STEMI Chronic systolic CHF  On aspirin and Brilinta PTA  Cardiology signed off  Brilinta stopped as per cardiology given need for [facility]  On coreg and low-dose Cozaar . continue current meds at d/c w/o titrating futher . F/u primary cardiologist Paraschos at d/c  Other Stroke Risk Factors  Advanced age  Former cigarette smoker - quit  ETOH use, advised to drink no more than 1 alcoholic beverage per day.  Obesity, Body mass index is 33.22 kg/m., recommend weight loss, diet and exercise as appropriate   Hx stroke/TIA - by  imaging  Other Active Problems  Abnormal CXR - Hazy left lower lobe airspace disease which may reflect atelectasis versus pneumonia.   Mild Leukocytosis 12.9 (afebrile) ->9.0  Left shoulder pain - x-ray no fx or dislocation  Hospital day # 5  Marvel Plan, MD PhD Stroke Neurology 12/26/2018 11:04 AM   To contact Stroke Continuity provider, please refer to [email or web address]. After hours, contact General Neurology

## 2018-12-26 NOTE — TOC Progression Note (Signed)
Transition of Care [facility]) - Progression Note    Patient Details  Name: PAX MONNIER MRN: [id number] Date of Birth: 20-Nov-1950  Transition of Care [facility]) CM/SW Contact  Baldemar Lenis, [place] Phone Number: 12/26/2018, 3:35 PM  Clinical Narrative:  CSW reached out to facilities in [place] to discuss patient's referral. Patient has received only one bed offer at [facility]. CSW to update patient's wife.     Expected Discharge Plan: Skilled Nursing Facility Barriers to Discharge: English as a second language teacher, Continued Medical Work up  Expected Discharge Plan and Services Expected Discharge Plan: Skilled Nursing Facility       Living arrangements for the past 2 months: Single Family Home                           Social Determinants of Health (SDOH) Interventions    Readmission Risk Interventions No flowsheet data found.

## 2018-12-26 NOTE — TOC Progression Note (Signed)
Transition of Care [facility]) - Progression Note    Patient Details  Name: Corey Meyers MRN: [id number] Date of Birth: Jan 01, 1951  Transition of Care [facility]) CM/SW Contact  Corey Meyers, [place] Phone Number: 12/26/2018, 3:35 PM  Clinical Narrative:  CSW updated patient's wife this morning that [facility] was the only offer. Patient's wife does not want [facility] because of her past experience where her mother died there. Patient's wife asked about other facilities in [place]. CSW reached out to [facility] and they are not able to take the patient. [facility] has been accepted to [facility] and patient's wife had accepted offer. [facility] initiated insurance authorization, awaiting authorization to admit to SNF.     Expected Discharge Plan: Skilled Nursing Facility Barriers to Discharge: English as a second language teacher, Continued Medical Work up  Expected Discharge Plan and Services Expected Discharge Plan: Skilled Nursing Facility       Living arrangements for the past 2 months: Single Family Home                           Social Determinants of Health (SDOH) Interventions    Readmission Risk Interventions No flowsheet data found.

## 2018-12-26 NOTE — Progress Notes (Signed)
Occupational Therapy Treatment Patient Details Name: Corey Meyers MRN: [id number] DOB: 30-May-1951 Today's Date: 12/26/2018    History of present illness 68 y/o male with h/o CAD s/p previous NSTEMIs with stents to RCA and LAD in 2014 & 2015, ischemic CM EF 35% by echo in 2015, HTN, HL and obesity. 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 CT of head showing ischemic changes in the left MCA territory consistent with acute/subacute infarction. s/p clot extraction   OT comments  Patient supine in bed and agreeable to OT.  Patient easily frustrated throughout session due to expressive aphasia, but demonstrating increased insight to deficits and able to follow multiple step commands with distractions with supervision.  Completed 4 step trail making task with supervision, 1 VC needs to recall to complete final task.  Utilize writing to decrease frustration when attempting to communicate with good success.  Believe CIR with maximize independence, but noted CIR signed off as pt family wants to pursue SNF in [place].  Will follow.    Follow Up Recommendations  SNF(noted CIR signed off and pt pursuing SNF)    Equipment Recommendations  Other (comment)(defer to next venue of care)    Recommendations for Other Services      Precautions / Restrictions Precautions Precautions: Fall Restrictions Weight Bearing Restrictions: No       Mobility Bed Mobility Overal bed mobility: Needs Assistance Bed Mobility: Supine to Sit;Sit to Supine     Supine to sit: Supervision Sit to supine: Supervision   General bed mobility comments: no assist required, supervision for safety  Transfers Overall transfer level: Needs assistance Equipment used: None Transfers: Sit to/from Stand Sit to Stand: Supervision         General transfer comment: supervision for safety    Balance Overall balance assessment: Needs assistance Sitting-balance support: No upper extremity  supported;Feet supported Sitting balance-Leahy Scale: Normal     Standing balance support: No upper extremity supported;During functional activity Standing balance-Leahy Scale: Good Standing balance comment: supervision for safety                           ADL either performed or assessed with clinical judgement   ADL Overall ADL's : Needs assistance/impaired     Grooming: Wash/dry hands;Oral care;Supervision/safety;Standing                   Toilet Transfer: Supervision/safety;Ambulation Toilet Transfer Details (indicate cue type and reason): simulated in room         Functional mobility during ADLs: Supervision/safety       Vision       Perception     Praxis      Cognition Arousal/Alertness: Awake/alert Behavior During Therapy: Flat affect Overall Cognitive Status: Difficult to assess Area of Impairment: Attention;Safety/judgement;Awareness;Problem solving;Memory;Following commands                   Current Attention Level: Alternating Memory: Decreased short-term memory Following Commands: Follows multi-step commands consistently;Follows multi-step commands with increased time Safety/Judgement: Decreased awareness of safety;Decreased awareness of deficits Awareness: Emergent Problem Solving: Slow processing;Requires verbal cues General Comments: pt performing 4 step trail making tasks with min cueing to recall final step, but able to recall with general cue; limited by aphaia        Exercises     Shoulder Instructions       General Comments highly frustrated with aphasia, utilized writing to assist with expression  Pertinent Vitals/ Pain       Pain Assessment: No/denies pain  Home Living                                          Prior Functioning/Environment              Frequency  Min 3X/week        Progress Toward Goals  OT Goals(current goals can now be found in the care plan section)   Progress towards OT goals: Progressing toward goals  Acute Rehab OT Goals Patient Stated Goal: none stated OT Goal Formulation: Patient unable to participate in goal setting Time For Goal Achievement: 01/05/19 Potential to Achieve Goals: Good  Plan Frequency remains appropriate;Discharge plan needs to be updated    Co-evaluation                 AM-PAC OT "6 Clicks" Daily Activity     Outcome Measure   Help from another person eating meals?: None Help from another person taking care of personal grooming?: None Help from another person toileting, which includes using toliet, bedpan, or urinal?: A Little Help from another person bathing (including washing, rinsing, drying)?: A Little Help from another person to put on and taking off regular upper body clothing?: None Help from another person to put on and taking off regular lower body clothing?: None 6 Click Score: 22    End of Session Equipment Utilized During Treatment: Gait belt  OT Visit Diagnosis: Unsteadiness on feet (R26.81);Other abnormalities of gait and mobility (R26.89);Muscle weakness (generalized) (M62.81);Other symptoms and signs involving cognitive function;Hemiplegia and hemiparesis Hemiplegia - Right/Left: Right Hemiplegia - dominant/non-dominant: Dominant Hemiplegia - caused by: Cerebral infarction   Activity Tolerance Patient tolerated treatment well   Patient Left with call bell/phone within reach;in bed;with bed alarm set   Nurse Communication Mobility status        Time: 1594-5859 OT Time Calculation (min): 20 min  Charges: OT General Charges $OT Visit: 1 Visit OT Treatments $Self Care/Home Management : 8-22 mins  Chancy Milroy, OT Acute Rehabilitation Services Pager [phone] Office [phone]    Chancy Milroy 12/26/2018, 3:19 PM

## 2018-12-26 NOTE — Progress Notes (Signed)
Physical Therapy Treatment Patient Details Name: [facility]y Meyers MRN: [id number] DOB: 1951-04-09 Today's Date: 12/26/2018    History of Present Illness 68 y/o male with h/o CAD s/p previous NSTEMIs with stents to RCA and LAD in 2014 & 2015, ischemic CM EF 35% by echo in 2015, HTN, HL and obesity. 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 CT of head showing ischemic changes in the left MCA territory consistent with acute/subacute infarction. s/p clot extraction    PT Comments    Pt performed gait training with progression to stair training.  He continues to make functional gains with higher level balance activities.  Pt continues to benefit from rehab in a post acute setting as he presents with speech impairments and minor balance impairments.  Continue to follow for acute stay.     Follow Up Recommendations  CIR     Equipment Recommendations  (TBD)    Recommendations for Other Services Rehab consult     Precautions / Restrictions Precautions Precautions: Fall Restrictions Weight Bearing Restrictions: No    Mobility  Bed Mobility Overal bed mobility: Needs Assistance Bed Mobility: Supine to Sit     Supine to sit: Supervision Sit to supine: Supervision   General bed mobility comments: no assist required, supervision for safety  Transfers Overall transfer level: Needs assistance Equipment used: None Transfers: Sit to/from Stand Sit to Stand: Supervision         General transfer comment: supervision for safety  Ambulation/Gait Ambulation/Gait assistance: Supervision(with intermittent min guard)   Assistive device: None Gait Pattern/deviations: Step-through pattern;Decreased stride length;Shuffle;Drifts right/left Gait velocity: decreased   General Gait Details: Pt with improved ability during gait training.  he continues with minor drift but improved steadiness overall, NO LOB.     Stairs Stairs: Yes Stairs assistance:  Supervision Stair Management: One rail Right;Alternating pattern Number of Stairs: 6(4 inch height) General stair comments: Close supervision for safety.     Wheelchair Mobility    Modified Rankin (Stroke Patients Only) Modified Rankin (Stroke Patients Only) Pre-Morbid Rankin Score: No symptoms Modified Rankin: Moderate disability     Balance Overall balance assessment: Needs assistance Sitting-balance support: No upper extremity supported;Feet supported Sitting balance-Leahy Scale: Normal     Standing balance support: No upper extremity supported;During functional activity Standing balance-Leahy Scale: Good Standing balance comment: supervision for safety                            Cognition Arousal/Alertness: Awake/alert Behavior During Therapy: Flat affect Overall Cognitive Status: Difficult to assess Area of Impairment: Attention;Safety/judgement;Awareness;Problem solving;Memory;Following commands                   Current Attention Level: Alternating Memory: Decreased short-term memory Following Commands: Follows multi-step commands consistently;Follows multi-step commands with increased time Safety/Judgement: Decreased awareness of safety;Decreased awareness of deficits Awareness: Emergent Problem Solving: Slow processing;Requires verbal cues General Comments: Pt able to answer all orientation questions.  Pt is following commands appropriately.  He was able to identify two things he saw outside a window correctly.        Exercises      General Comments General comments (skin integrity, edema, etc.): highly frustrated with aphasia, utilized writing to assist with expression       Pertinent Vitals/Pain Pain Assessment: No/denies pain    Home Living  Prior Function            PT Goals (current goals can now be found in the care plan section) Acute Rehab PT Goals Patient Stated Goal: none stated Potential to  Achieve Goals: Good Progress towards PT goals: Progressing toward goals    Frequency    Min 4X/week      PT Plan Current plan remains appropriate    Co-evaluation              AM-PAC PT "6 Clicks" Mobility   Outcome Measure  Help needed turning from your back to your side while in a flat bed without using bedrails?: None Help needed moving from lying on your back to sitting on the side of a flat bed without using bedrails?: None Help needed moving to and from a bed to a chair (including a wheelchair)?: None Help needed standing up from a chair using your arms (e.g., wheelchair or bedside chair)?: None Help needed to walk in hospital room?: A Little Help needed climbing 3-5 steps with a railing? : A Little 6 Click Score: 22    End of Session Equipment Utilized During Treatment: Gait belt Activity Tolerance: Patient tolerated treatment well Patient left: in chair;with call bell/phone within reach(w/ speech therapy) Nurse Communication: Mobility status PT Visit Diagnosis: Other abnormalities of gait and mobility (R26.89);Other symptoms and signs involving the nervous system (Z61.096(R29.898)     Time: [id number] PT Time Calculation (min) (ACUTE ONLY): 12 min  Charges:  $Gait Training: 8-22 mins                     Joycelyn RuaAimee Johnny Gorter, PTA Acute Rehabilitation Services Pager [phone] Office [phone]     Amos Micheals Artis DelayJ Arvon Schreiner 12/26/2018, 4:01 PM

## 2018-12-27 DIAGNOSIS — I2129 ST elevation (STEMI) myocardial infarction involving other sites: Secondary | ICD-10-CM | POA: Diagnosis present

## 2018-12-27 DIAGNOSIS — I5022 Chronic systolic (congestive) heart failure: Secondary | ICD-10-CM | POA: Diagnosis present

## 2018-12-27 DIAGNOSIS — I10 Essential (primary) hypertension: Secondary | ICD-10-CM

## 2018-12-27 DIAGNOSIS — I214 Non-ST elevation (NSTEMI) myocardial infarction: Secondary | ICD-10-CM | POA: Diagnosis present

## 2018-12-27 DIAGNOSIS — E785 Hyperlipidemia, unspecified: Secondary | ICD-10-CM | POA: Diagnosis present

## 2018-12-27 LAB — GLUCOSE, CAPILLARY
Glucose-Capillary: 104 mg/dL — ABNORMAL HIGH (ref 70–99)
Glucose-Capillary: 94 mg/dL (ref 70–99)
Glucose-Capillary: 102 mg/dL — ABNORMAL HIGH (ref 70–99)

## 2018-12-27 MED ORDER — CARVEDILOL 3.125 MG PO TABS: 3.1250 mg | ORAL_TABLET | Freq: Two times a day (BID) | ORAL | Status: DC

## 2018-12-27 MED ORDER — ATORVASTATIN CALCIUM 80 MG PO TABS: 80.0000 mg | ORAL_TABLET | Freq: Every day | ORAL | Status: DC

## 2018-12-27 MED ORDER — APIXABAN 5 MG PO TABS: 5.0000 mg | ORAL_TABLET | Freq: Two times a day (BID) | ORAL | Status: DC

## 2018-12-27 MED ORDER — LOSARTAN POTASSIUM 25 MG PO TABS: 25.0000 mg | ORAL_TABLET | Freq: Every day | ORAL | Status: DC

## 2018-12-27 MED ORDER — ENSURE ENLIVE PO LIQD: 237.0000 mL | Freq: Every day | ORAL | 12 refills | Status: DC

## 2018-12-27 NOTE — Progress Notes (Signed)
Physical Therapy Treatment Patient Details Name: Corey Meyers MRN: [id number] DOB: September 29, 1950 Today's Date: 12/27/2018    History of Present Illness 68 y/o male with h/o CAD s/p previous NSTEMIs with stents to RCA and LAD in 2014 & 2015, ischemic CM EF 35% by echo in 2015, HTN, HL and obesity. 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 CT of head showing ischemic changes in the left MCA territory consistent with acute/subacute infarction. s/p clot extraction    PT Comments    Pt performed gt training and progression of stair training to 6 inch step.  Pt able to climb flight with min guard assistance.  Plan for post acute rehab for cognitive therapies remains appropriate.    Follow Up Recommendations  CIR     Equipment Recommendations  (TBD)    Recommendations for Other Services Rehab consult     Precautions / Restrictions Precautions Precautions: Fall Restrictions Weight Bearing Restrictions: No    Mobility  Bed Mobility Overal bed mobility: Needs Assistance Bed Mobility: Supine to Sit     Supine to sit: Supervision Sit to supine: Supervision   General bed mobility comments: no assist required, supervision for safety  Transfers Overall transfer level: Needs assistance Equipment used: None Transfers: Sit to/from Stand Sit to Stand: Supervision         General transfer comment: supervision for safety  Ambulation/Gait Ambulation/Gait assistance: Supervision Gait Distance (Feet): 200 Feet Assistive device: None Gait Pattern/deviations: Step-through pattern;Decreased stride length;Shuffle;Drifts right/left Gait velocity: decreased   General Gait Details: Pt with improved ability during gait training.  he continues with minor drift but improved steadiness overall, NO LOB.     Stairs Stairs: Yes Stairs assistance: Min guard Stair Management: One rail Right;Alternating pattern Number of Stairs: 12 General stair comments: Close  supervision for safety.     Wheelchair Mobility    Modified Rankin (Stroke Patients Only) Modified Rankin (Stroke Patients Only) Pre-Morbid Rankin Score: No symptoms Modified Rankin: Moderate disability     Balance Overall balance assessment: Needs assistance   Sitting balance-Leahy Scale: Normal       Standing balance-Leahy Scale: Good                              Cognition Arousal/Alertness: Awake/alert Behavior During Therapy: Flat affect Overall Cognitive Status: Difficult to assess Area of Impairment: Attention;Safety/judgement;Awareness;Problem solving;Memory;Following commands                   Current Attention Level: Alternating Memory: Decreased short-term memory Following Commands: Follows multi-step commands consistently;Follows multi-step commands with increased time Safety/Judgement: Decreased awareness of safety;Decreased awareness of deficits Awareness: Emergent Problem Solving: Slow processing;Requires verbal cues General Comments: Pt able to answer all orientation questions.  Pt is following commands appropriately.  He was able to identify two things he saw outside a window correctly.        Exercises General Exercises - Lower Extremity Long Arc Quad: AROM;Both;10 reps;Seated Hip Flexion/Marching: AROM;Both;10 reps;Seated Other Exercises Other Exercises: Pillow squeeze 10 reps for 3 sec hold    General Comments        Pertinent Vitals/Pain Pain Assessment: No/denies pain Faces Pain Scale: Hurts a little bit Pain Location: L shoulder with movement into flexion.  Pain Descriptors / Indicators: Discomfort;Grimacing;Guarding Pain Intervention(s): Monitored during session;Repositioned    Home Living  Prior Function            PT Goals (current goals can now be found in the care plan section) Acute Rehab PT Goals Patient Stated Goal: none stated Potential to Achieve Goals: Good Progress  towards PT goals: Progressing toward goals    Frequency    Min 4X/week      PT Plan Current plan remains appropriate    Co-evaluation              AM-PAC PT "6 Clicks" Mobility   Outcome Measure  Help needed turning from your back to your side while in a flat bed without using bedrails?: None Help needed moving from lying on your back to sitting on the side of a flat bed without using bedrails?: None Help needed moving to and from a bed to a chair (including a wheelchair)?: None Help needed standing up from a chair using your arms (e.g., wheelchair or bedside chair)?: None Help needed to walk in hospital room?: A Little Help needed climbing 3-5 steps with a railing? : A Little 6 Click Score: 22    End of Session Equipment Utilized During Treatment: Gait belt Activity Tolerance: Patient tolerated treatment well Patient left: in chair;with call bell/phone within reach;with chair alarm set Nurse Communication: Mobility status PT Visit Diagnosis: Other abnormalities of gait and mobility (R26.89);Other symptoms and signs involving the nervous system (R29.898)     Time: [id number] PT Time Calculation (min) (ACUTE ONLY): 15 min  Charges:  $Gait Training: 8-22 mins                     Joycelyn RuaAimee Kegan Shepardson, PTA Acute Rehabilitation Services Pager [phone] Office [phone]     Djimon Lundstrom Artis DelayJ Ilyssa Grennan 12/27/2018, 4:29 PM

## 2018-12-27 NOTE — Care Management Important Message (Signed)
Important Message  Patient Details  Name: ACEN MCVEIGH MRN: [id number] Date of Birth: [birth year]   Medicare Important Message Given:  Yes    Sherronda Sweigert Stefan Church 12/27/2018, 3:40 PM

## 2018-12-27 NOTE — Discharge Summary (Addendum)
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 Hyperlipidemia   . Hypertension   . Stroke [facility](HCC)    Past Surgical History:  Procedure Laterality Date  . IR CT HEAD LTD  12/21/2018  . IR PERCUTANEOUS ART THROMBECTOMY/INFUSION INTRACRANIAL INC DIAG ANGIO  12/21/2018  . LAMINECTOMY    . RADIOLOGY WITH ANESTHESIA N/A 12/21/2018   Procedure: RADIOLOGY WITH ANESTHESIA;  Surgeon: Julieanne Cottoneveshwar, Sanjeev, MD;  Location: [facility] OR;  Service: Radiology;  Laterality: N/A;  . TONSILLECTOMY      Allergies as of 12/27/2018   No Known Allergies     Medication List    STOP taking these medications   aspirin EC 81 MG tablet   ticagrelor 90 MG Tabs tablet Commonly known as:  BRILINTA     TAKE these medications   apixaban 5 MG Tabs tablet Commonly known as:  ELIQUIS Take 1 tablet (5 mg total) by mouth 2 (two) times daily.   atorvastatin 80 MG tablet Commonly known as:  LIPITOR Take 1 tablet (80 mg total) by mouth daily at 6 PM. What changed:  when to take this   carvedilol 3.125 MG tablet Commonly known as:  COREG Take 1 tablet (3.125 mg total) by mouth 2 (two) times daily with a meal. What changed:  when to take this   feeding supplement (ENSURE ENLIVE)  Liqd Take 237 mLs by mouth daily at 3 pm.   losartan 25 MG tablet Commonly known as:  COZAAR Take 1 tablet (25 mg total) by mouth daily. Start taking on:  December 28, 2018   nitroGLYCERIN 0.4 MG SL tablet Commonly known as:  NITROSTAT Place 0.4 mg under the tongue every 5 (five) minutes as needed for chest pain.       LABORATORY STUDIES CBC    Component Value Date/Time   WBC 9.0 12/25/2018 0546   RBC 4.82 12/25/2018 0546   HGB 14.2 12/25/2018 0546   HGB 13.1 02/12/2014 0210   HCT 41.0 12/25/2018 0546   HCT 38.7 (L) 02/12/2014 0210   PLT 288 12/25/2018 0546   PLT 291 02/12/2014 0210   MCV 85.1 12/25/2018 0546   MCV 89 02/12/2014 0210   MCH 29.5 12/25/2018 0546   MCHC 34.6 12/25/2018 0546   RDW 13.0 12/25/2018 0546   RDW 14.4 02/12/2014 0210   LYMPHSABS 2.4 12/25/2018 0546   LYMPHSABS 1.9 02/12/2014 0210   MONOABS 0.9 12/25/2018 0546   MONOABS 1.7 (H) 02/12/2014 0210   EOSABS 0.3 12/25/2018 0546   EOSABS 0.0 02/12/2014 0210   BASOSABS 0.0 12/25/2018 0546   BASOSABS 0.1 02/12/2014 0210   CMP    Component Value Date/Time   NA 139 12/25/2018 0546   NA 135 (L) 02/12/2014 0210   K 3.7 12/25/2018 0546  K 3.5 02/12/2014 0210   CL 106 12/25/2018 0546   CL 102 02/12/2014 0210   CO2 24 12/25/2018 0546   CO2 26 02/12/2014 0210   GLUCOSE 105 (H) 12/25/2018 0546   GLUCOSE 128 (H) 02/12/2014 0210   BUN 10 12/25/2018 0546   BUN 11 02/12/2014 0210   CREATININE 0.75 12/25/2018 0546   CREATININE 0.73 02/12/2014 0210   CALCIUM 9.2 12/25/2018 0546   CALCIUM 8.4 (L) 02/12/2014 0210   PROT 6.3 (L) 12/22/2018 0429   PROT 6.4 02/12/2014 0210   ALBUMIN 3.3 (L) 12/22/2018 0429   ALBUMIN 3.0 (L) 02/12/2014 0210   AST 16 12/22/2018 0429   AST 336 (H) 02/12/2014 0210   ALT 13 12/22/2018 0429   ALT 57 02/12/2014 0210   ALKPHOS 62 12/22/2018 0429   ALKPHOS 65 02/12/2014 0210   BILITOT 0.7 12/22/2018 0429   BILITOT 0.5 02/12/2014 0210   GFRNONAA >60 12/25/2018 0546   GFRNONAA  >60 02/12/2014 0210   GFRAA >60 12/25/2018 0546   GFRAA >60 02/12/2014 0210   COAGS Lab Results  Component Value Date   INR 1.1 12/21/2018   INR 1.0 10/08/2012   Lipid Panel    Component Value Date/Time   CHOL 165 12/22/2018 0429   CHOL 134 02/12/2014 0210   TRIG 131 12/22/2018 0429   TRIG 122 02/12/2014 0210   HDL 34 (L) 12/22/2018 0429   HDL 36 (L) 02/12/2014 0210   CHOLHDL 4.9 12/22/2018 0429   VLDL 26 12/22/2018 0429   VLDL 24 02/12/2014 0210   LDLCALC 105 (H) 12/22/2018 0429   LDLCALC 74 02/12/2014 0210   HgbA1C  Lab Results  Component Value Date   HGBA1C 5.7 (H) 12/22/2018   Urinalysis    Component Value Date/Time   COLORURINE STRAW (A) 12/23/2018 1332   APPEARANCEUR CLEAR 12/23/2018 1332   LABSPEC 1.006 12/23/2018 1332   PHURINE 8.0 12/23/2018 1332   GLUCOSEU NEGATIVE 12/23/2018 1332   HGBUR NEGATIVE 12/23/2018 1332   BILIRUBINUR NEGATIVE 12/23/2018 1332   KETONESUR NEGATIVE 12/23/2018 1332   PROTEINUR NEGATIVE 12/23/2018 1332   NITRITE NEGATIVE 12/23/2018 1332   LEUKOCYTESUR NEGATIVE 12/23/2018 1332   Urine Drug Screen     Component Value Date/Time   LABOPIA NONE DETECTED 12/23/2018 1332   COCAINSCRNUR NONE DETECTED 12/23/2018 1332   LABBENZ POSITIVE (A) 12/23/2018 1332   AMPHETMU NONE DETECTED 12/23/2018 1332   THCU NONE DETECTED 12/23/2018 1332   LABBARB NONE DETECTED 12/23/2018 1332    Alcohol Level    Component Value Date/Time   ETH <10 12/21/2018 1410     SIGNIFICANT DIAGNOSTIC STUDIES Ct Head Code Stroke Wo Contrast 12/21/2018 IMPRESSION:  1. Ischemic changes in the left MCA territory consistent with acute/subacute infarction. Involvement of the left caudate, lentiform nuclei, deep insula, internal capsule, M 5 and M 6 regions. No sign of hemorrhage or mass effect.  2. ASPECTS is 4   Ct Angio Head W Or Wo Contrast Ct Angio Neck W Or Wo Contrast Ct Cerebral Perfusion W Contrast 12/21/2018 IMPRESSION:  Embolic occlusion of a left M2  branch. T-max greater than 6 seconds in the left posterior frontal and parietal cortical and subcortical brain, without evidence of actual completed infarction by perfusion imaging. The findings in the basal ganglia and insula must relate to an old insult. I am somewhat surprised at the well visible low-density in the area of penumbra. However, given all this data, patient would seem to be a candidate for intervention.   Interventional  Radiology - Cerebral Angiogram with Intervention - Dr Corliss Skains S/P lt common carotid arteriogram followed by complete revascularization of occluded M 2 Branch of Lt MCA inf division Achieving a TICI 2b revascularization.  Mr Brain Wo Contrast 12/22/2018 IMPRESSION:  1. Intermediate volume left MCA territory acute infarct without hemorrhage or significant mass effect.  2. Punctate foci of acute ischemia within the left anterior cerebral artery territory.   Dg Chest Port 1 View 12/21/2018 IMPRESSION:  Hazy left lower lobe airspace disease which may reflect atelectasis versus pneumonia.   Transthoracic Echocardiogram  12/22/2018 IMPRESSIONS 1. Mild hypokinesis of the left ventricular, entire inferior wall. 2. Moderate hypokinesis of the left ventricular, entire anterior wall. 3. Severe akinesis of the left ventricular, entire apical segment. 4. Severe akinesis of the left ventricular, mid-apical anteroseptal wall. 5. Moderate, fixed thrombus on the apical wall of the left ventricle. 6. The left ventricle has moderately reduced systolic function, with an ejection fraction of 35-40%. The cavity size was normal. Left ventricular diastolic function could not be evaluated. 7. The right ventricle has normal systolic function. The cavity was normal. There is no increase in right ventricular wall thickness. Right ventricular systolic pressure could not be assessed. 8. Left atrial size was not assessed. 9. The pericardium was not assessed. 10. The aortic  valve is grossly normal. 11. When compared to the prior study: Compared to prior reports in Care Everywhere, LV clot is new. No images for direct comparison. 12. The interatrial septum was not assessed.  EKG - SR rate 79 BPM. (See cardiology reading for complete details)     HISTORY OF PRESENT ILLNESS Corey Meyers is a 68 y.o. male with PMhx significant for HTN, dyslipidemia and CAD with stents in right PDA and mid LAD brought to ED via EMS when found altered and unresponsive by wife around 1 PM on 12/20/2018. Per EMS patient called the wife around 7:30 PM last night and sounded normal, when wife came home today around 1 PM she found him altered and minimally responsive so she called the EMS.  Per EMS patient was mostly aphasic except for a few mumbles, overall weak more pronounced on right and not following commands.  Per EMS no recent illness or sick contacts. The patient called his wife at 12:30 PM today and his speech was garbled and unintelligible. She thought that maybe he was trying to call her because he knew something was wrong. EMS was called. Pertinent risk factors for stroke of hypertension, dyslipidemia and coronary artery disease. tPA was not given as he was out of the window. Premorbid modified Rankin scale (mRS): 0. He was on aspirin and brilinta at home.    HOSPITAL COURSE [name] is a 68 y.o. male with history of HTN,dyslipidemia and CAD with stents found minimally responsive, aphasic, except for afew mumbles,overall weak, more pronounced on right and not following commands. He did not receive IV t-PA due to late presentation. Sent to IR, S/P lt common carotid arteriogram followed by complete revascularization of occluded M 2 Branch of Lt MCA inf division Achieving a TICI 2b revascularization.   Stroke: Left temporal MCA and punctate left ACA infarct - embolic -  due to LV thrombus - s/p mechanical thrombectomy occluded L M2  CT head - Ischemic changes in the left MCA  territory consistent with acute/subacute infarction.  MRI head -  Intermediate volume left MCA territory acute infarct. Punctate foci of acute ischemia within the left ACA territory.   CTA  H&N - Embolic occlusion of a left M2 branch. Basal ganglia and insula must relate to an old insult.  2D Echo - 35-40% - Moderate, fixed thrombus on the apical wall of the left ventricle.  LDL - 105  HgbA1c - 5.7  aspirin 81 mg daily and Brilinta (ticagrelor) 90 mg bid prior to admission, now on eliquis due to apical thrombus in the left ventricle  Therapy recommendations:  CIR-> SNF (no assist at discharge)  Disposition:  SNF  LV thrombus  Echo showed EF 35 to 40% with LV apical thrombus  Cardiology consulted  On Eliquis  Follow up with cardiology as outpt  Hypertension  Stable  Cardiology recommends to continue current meds at d/c w/o titrating futher  F/u primary cardiologist Paraschos at d/c  Long term BP goal normotensive  Hyperlipidemia  Lipid lowering medication PTA:  Lipitor 80 mg daily  LDL 105, goal < 70  Current lipid lowering medication: Lipitor 80 mg daily  Continue statin at discharge  CAD/non-STEMI Chronic systolic CHF  On aspirin and Brilinta PTA  Cardiology consulted  Brilinta stopped as per cardiology given need for [facility]  On coreg and low-dose Cozaar  continue current meds at d/c w/o titrating futher  F/u primary cardiologist Paraschos at d/c  Other Stroke Risk Factors  Advanced age  Former cigarette smoker - quit  ETOH use, advised to drink no more than 2 alcoholic beverages per day.  Obesity, Body mass index is 33.22 kg/m., recommend weight loss, diet and exercise as appropriate   Hx stroke/TIA - by imaging  Other Active Problems  Abnormal CXR - Hazy left lower lobe airspace disease which may reflect atelectasis versus pneumonia.   Mild Leukocytosis, resolved 12.9 (afebrile) ->9.0  Left shoulder pain - x-ray no fx or  dislocation  Malnutrition. Inadequate oral intake d/t NPO status in hospital. Started on Ensure Enlive   DISCHARGE EXAM Blood pressure 123/76, pulse 78, temperature 97.6 F (36.4 C), temperature source Oral, resp. rate 16, height 6' (1.829 m), weight 111.1 kg, SpO2 94 %. General - Well nourished, well developed, in no apparent distress.  Ophthalmologic - fundi not visualized due to noncooperation.  Cardiovascular - Regular rate and rhythm.  Neuro - awake alert, Orientated to name, age and place.  Expressive aphasia, following simple commands.  Not able to name or repeat.  PERRL, EOMI, visual field full.  No significant facial asymmetry, tongue midline.  Moving extremity equally except left pronator drift due to left shoulder pain.  Sensation symmetrical, DTR 1+, no Babinski.  Coordination intact except left finger-to-nose slow due to left shoulder pain.  Gait not tested   Discharge Diet   Heart healthy thin liquids  DISCHARGE PLAN  Disposition:  Skilled nursing facility for ongoing PT, OT and ST.   Eliquis (apixaban) daily for secondary stroke prevention  Ongoing stroke risk factor control by Primary Care Physician or SNF MD at time of discharge  [facility]up [facility] or MD at [facility] in 2 weeks.  Follow-up in [facility] in 4 weeks, office to schedule an appointment.   Follow-up cardiologist Dr. Darrold Junker in 2 weeks. Call for an appt.   40 minutes were spent preparing discharge.  Marvel Plan, MD PhD Stroke Neurology 12/27/2018 3:35 PM

## 2018-12-27 NOTE — Progress Notes (Signed)
  Speech Language Pathology Treatment: Cognitive-Linquistic(Aphasia )  Patient Details Name: Corey Meyers MRN: [id number] DOB: [birth year] Today's Date: 12/27/2018 Time: 4982-6415 SLP Time Calculation (min) (ACUTE ONLY): 39 min  Assessment / Plan / Recommendation Clinical Impression  Pt was seen for aphasia treatment. He demonstrated increased attempts to engage in conversation during this session which led to multiple instances of frustration during the session. With some paraphasias and approximations, he expressed that he just wants to enjoy his retirement but he can only get three-quarter of his sentences out. He was encouraged regarding his progress thus far and appeared to be in slightly better spirits at the end of the session. Pt also expressed concerns regarding how long it will take for him to improve and be back to baseline. He was educated regarding methods which facilitate improved patient outcomes as well as his good prognosis right now but that a definitive length of time could not be accurately provided. He verbalized understanding. He demonstrated 71% accuracy with confrontational naming. A spelling task was completed due to pt's report of difficulty and his indicated that he wrote frequently prior to admission. He demonstrated 70% accuracy with spelling at the word level increasing to 100% accuracy with min-mod cues. Pt currently has discharge orders and will benefit from continued SLP services following discharge.    HPI HPI: Pt is a 68 y.o. male with PMhx significant for HTN, dyslipidemia and CAD with stents in right PDA and mid LAD who was brought to ED via EMS when found with altered mental status and unresponsive by wife around 1 PM on 12/21/18. CT of the head on 12/21/18 showed ischemic changes in the left MCA territory consistent with acute/subacute infarction. Involvement of the left caudate, lentiform nuclei, deep insula, internal capsule, M 5 and M 6 regions. Thrombectomy was conducted  on 12/21/18 and MRI from 12/23/18 showed left MCA territory acute infarct without hemorrhage or significant mass effect. Punctate foci of acute ischemia within the left anterior cerebral artery territory.      SLP Plan  Continue with current plan of care       Recommendations                   Follow up Recommendations: Inpatient Rehab SLP Visit Diagnosis: Aphasia (R47.01) Plan: Continue with current plan of care       Brailey Buescher I. Vear Clock, MS, CCC-SLP Acute Rehabilitation Services Office number [phone] Pager [phone]        Scheryl Marten 12/27/2018, 2:37 PM

## 2018-12-27 NOTE — TOC Transition Note (Signed)
Transition of Care [facility]) - CM/SW Discharge Note   Patient Details  Name: NOAHH BAUSMAN MRN: [id number] Date of Birth: 1951-06-07  Transition of [facility]) CM/SW Contact:  Baldemar Lenis, LCSW Phone Number: 12/27/2018, 2:20 PM   Clinical Narrative: Nurse to call report to [phone], Room 131A      Final next level of care: Skilled Nursing Facility Barriers to Discharge: No Barriers Identified   Patient Goals and CMS Choice Patient states their goals for this hospitalization and ongoing recovery are:: patient unable to participate in goal setting CMS Medicare.gov Compare Post Acute Care list provided to:: Patient Represenative (must comment) Choice offered to / list presented to : Spouse  Discharge Placement              Patient [facility] at: [facility] Patient to be transferred to facility by: [facility] Name of family member notified: Rhonda Patient and family notified of of transfer: 12/27/18  Discharge Plan and Services                          Social Determinants of Health (SDOH) Interventions     Readmission Risk Interventions No flowsheet data found.

## 2019-02-27 ENCOUNTER — Inpatient Hospital Stay: Payer: Self-pay | Admitting: Adult Health

## 2019-04-02 ENCOUNTER — Other Ambulatory Visit: Payer: Self-pay

## 2019-04-02 NOTE — Patient Outreach (Signed)
Telephone outreach to patient to obtain mRs was successfully completed. mRs= 1. 
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
## 2019-11-02 ENCOUNTER — Ambulatory Visit: Payer: Self-pay

## 2019-11-02 ENCOUNTER — Ambulatory Visit: Payer: Medicare HMO | Attending: Internal Medicine

## 2019-11-02 DIAGNOSIS — Z23 Encounter for immunization: Secondary | ICD-10-CM | POA: Insufficient documentation

## 2019-11-02 NOTE — Progress Notes (Signed)
   Covid-19 Vaccination Clinic  Name:  Corey Meyers    MRN: [id number] DOB: Nov 29, 1950  11/02/2019  [name] was observed post Covid-19 immunization for 15 minutes without incidence. He was provided with Vaccine Information Sheet and instruction to access the V-Safe system.   [name] was instructed to call 911 with any severe reactions post vaccine: Marland Kitchen Difficulty breathing  . Swelling of your face and throat  . A fast heartbeat  . A bad rash all over your body  . Dizziness and weakness    Immunizations Administered    Name Date Dose VIS Date Route   Pfizer COVID-19 Vaccine 11/02/2019  9:31 AM 0.3 mL 08/30/2019 Intramuscular   Manufacturer: [employer], [employer]   Lot: [id number]   NDC: [address]-0

## 2019-11-23 ENCOUNTER — Ambulatory Visit: Payer: Medicare HMO | Attending: Internal Medicine

## 2019-11-23 DIAGNOSIS — Z23 Encounter for immunization: Secondary | ICD-10-CM

## 2019-11-23 NOTE — Progress Notes (Signed)
   Covid-19 Vaccination Clinic  Name:  Corey Meyers    MRN: [id number] DOB: 08/14/1951  11/23/2019  [name] was observed post Covid-19 immunization for 15 minutes without incident. He was provided with Vaccine Information Sheet and instruction to access the V-Safe system.   [name] was instructed to call 911 with any severe reactions post vaccine: Marland Kitchen Difficulty breathing  . Swelling of face and throat  . A fast heartbeat  . A bad rash all over body  . Dizziness and weakness   Immunizations Administered    Name Date Dose VIS Date Route   Pfizer COVID-19 Vaccine 11/23/2019  9:56 AM 0.3 mL 08/30/2019 Intramuscular   Manufacturer: [employer], [employer]   Lot: [id number]   NDC: [address]-2

## 2020-05-15 ENCOUNTER — Emergency Department: Payer: Medicare HMO

## 2020-05-15 ENCOUNTER — Inpatient Hospital Stay
Admission: EM | Admit: 2020-05-15 | Discharge: 2020-05-21 | DRG: 100 | Disposition: A | Discharge status: Skilled Nursing Facility | Payer: Medicare HMO | Attending: Internal Medicine | Admitting: Internal Medicine

## 2020-05-15 ENCOUNTER — Encounter: Payer: Self-pay | Admitting: Radiology

## 2020-05-15 DIAGNOSIS — I69351 Hemiplegia and hemiparesis following cerebral infarction affecting right dominant side: Secondary | ICD-10-CM

## 2020-05-15 DIAGNOSIS — G40A01 Absence epileptic syndrome, not intractable, with status epilepticus: Secondary | ICD-10-CM | POA: Diagnosis present

## 2020-05-15 DIAGNOSIS — G8384 Todd's paralysis (postepileptic): Secondary | ICD-10-CM | POA: Diagnosis present

## 2020-05-15 DIAGNOSIS — I2510 Atherosclerotic heart disease of native coronary artery without angina pectoris: Secondary | ICD-10-CM | POA: Diagnosis present

## 2020-05-15 DIAGNOSIS — Z7901 Long term (current) use of anticoagulants: Secondary | ICD-10-CM | POA: Diagnosis not present

## 2020-05-15 DIAGNOSIS — Z981 Arthrodesis status: Secondary | ICD-10-CM

## 2020-05-15 DIAGNOSIS — I361 Nonrheumatic tricuspid (valve) insufficiency: Secondary | ICD-10-CM | POA: Diagnosis not present

## 2020-05-15 DIAGNOSIS — I5022 Chronic systolic (congestive) heart failure: Secondary | ICD-10-CM | POA: Diagnosis present

## 2020-05-15 DIAGNOSIS — I110 Hypertensive heart disease with heart failure: Secondary | ICD-10-CM | POA: Diagnosis present

## 2020-05-15 DIAGNOSIS — E872 Acidosis: Secondary | ICD-10-CM | POA: Diagnosis present

## 2020-05-15 DIAGNOSIS — Z79899 Other long term (current) drug therapy: Secondary | ICD-10-CM | POA: Diagnosis not present

## 2020-05-15 DIAGNOSIS — J9602 Acute respiratory failure with hypercapnia: Secondary | ICD-10-CM | POA: Diagnosis present

## 2020-05-15 DIAGNOSIS — E785 Hyperlipidemia, unspecified: Secondary | ICD-10-CM | POA: Diagnosis present

## 2020-05-15 DIAGNOSIS — G9349 Other encephalopathy: Secondary | ICD-10-CM | POA: Diagnosis not present

## 2020-05-15 DIAGNOSIS — D689 Coagulation defect, unspecified: Secondary | ICD-10-CM | POA: Diagnosis present

## 2020-05-15 DIAGNOSIS — R569 Unspecified convulsions: Secondary | ICD-10-CM

## 2020-05-15 DIAGNOSIS — J9601 Acute respiratory failure with hypoxia: Secondary | ICD-10-CM | POA: Diagnosis present

## 2020-05-15 DIAGNOSIS — Z87891 Personal history of nicotine dependence: Secondary | ICD-10-CM

## 2020-05-15 DIAGNOSIS — Z20822 Contact with and (suspected) exposure to covid-19: Secondary | ICD-10-CM | POA: Diagnosis present

## 2020-05-15 DIAGNOSIS — E876 Hypokalemia: Secondary | ICD-10-CM | POA: Diagnosis present

## 2020-05-15 DIAGNOSIS — R4701 Aphasia: Secondary | ICD-10-CM | POA: Diagnosis present

## 2020-05-15 DIAGNOSIS — J9621 Acute and chronic respiratory failure with hypoxia: Secondary | ICD-10-CM | POA: Diagnosis not present

## 2020-05-15 DIAGNOSIS — R2990 Unspecified symptoms and signs involving the nervous system: Secondary | ICD-10-CM

## 2020-05-15 DIAGNOSIS — I513 Intracardiac thrombosis, not elsewhere classified: Secondary | ICD-10-CM | POA: Diagnosis not present

## 2020-05-15 DIAGNOSIS — I10 Essential (primary) hypertension: Secondary | ICD-10-CM | POA: Diagnosis not present

## 2020-05-15 LAB — URINE DRUG SCREEN, QUALITATIVE (ARMC ONLY)
Amphetamines, Ur Screen: NOT DETECTED
Barbiturates, Ur Screen: NOT DETECTED
Benzodiazepine, Ur Scrn: NOT DETECTED
Cannabinoid 50 Ng, Ur ~~LOC~~: NOT DETECTED
Cocaine Metabolite,Ur ~~LOC~~: NOT DETECTED
MDMA (Ecstasy)Ur Screen: NOT DETECTED
Methadone Scn, Ur: NOT DETECTED
Opiate, Ur Screen: NOT DETECTED
Phencyclidine (PCP) Ur S: NOT DETECTED
Tricyclic, Ur Screen: NOT DETECTED

## 2020-05-15 LAB — DIFFERENTIAL
Abs Immature Granulocytes: 0.08 10*3/uL — ABNORMAL HIGH (ref 0.00–0.07)
Basophils Absolute: 0.1 10*3/uL (ref 0.0–0.1)
Basophils Relative: 0 %
Eosinophils Absolute: 0.2 10*3/uL (ref 0.0–0.5)
Eosinophils Relative: 1 %
Immature Granulocytes: 1 %
Lymphocytes Relative: 25 %
Lymphs Abs: 4.1 10*3/uL — ABNORMAL HIGH (ref 0.7–4.0)
Monocytes Absolute: 0.6 10*3/uL (ref 0.1–1.0)
Monocytes Relative: 4 %
Neutro Abs: 11.1 10*3/uL — ABNORMAL HIGH (ref 1.7–7.7)
Neutrophils Relative %: 69 %

## 2020-05-15 LAB — COMPREHENSIVE METABOLIC PANEL
ALT: 17 U/L (ref 0–44)
AST: 36 U/L (ref 15–41)
Albumin: 4.1 g/dL (ref 3.5–5.0)
Alkaline Phosphatase: 82 U/L (ref 38–126)
Anion gap: 23 — ABNORMAL HIGH (ref 5–15)
BUN: 14 mg/dL (ref 8–23)
CO2: 15 mmol/L — ABNORMAL LOW (ref 22–32)
Calcium: 9.3 mg/dL (ref 8.9–10.3)
Chloride: 102 mmol/L (ref 98–111)
Creatinine, Ser: 1.23 mg/dL (ref 0.61–1.24)
GFR calc Af Amer: >60 mL/min (ref >60)
GFR calc non Af Amer: 60 mL/min — ABNORMAL LOW (ref >60)
Glucose, Bld: 161 mg/dL — ABNORMAL HIGH (ref 70–99)
Potassium: 4.8 mmol/L (ref 3.5–5.1)
Sodium: 140 mmol/L (ref 135–145)
Total Bilirubin: 0.7 mg/dL (ref 0.3–1.2)
Total Protein: 8.2 g/dL — ABNORMAL HIGH (ref 6.5–8.1)

## 2020-05-15 LAB — PROTIME-INR
INR: 1.8 — ABNORMAL HIGH (ref 0.8–1.2)
Prothrombin Time: 20.1 seconds — ABNORMAL HIGH (ref 11.4–15.2)

## 2020-05-15 LAB — BLOOD GAS, ARTERIAL
Acid-Base Excess: 1 mmol/L (ref 0.0–2.0)
Acid-base deficit: 3.9 mmol/L — ABNORMAL HIGH (ref 0.0–2.0)
Bicarbonate: 28.6 mmol/L — ABNORMAL HIGH (ref 20.0–28.0)
Bicarbonate: 26.6 mmol/L (ref 20.0–28.0)
FIO2: 0.55
MECHVT: 500 mL
O2 Saturation: 61.2 %
O2 Saturation: 95.8 %
PEEP: 5 cmH2O
Patient temperature: 37
Patient temperature: 37
RATE: 18 resp/min
pCO2 arterial: 86 mmHg (ref 32.0–48.0)
pCO2 arterial: 45 mmHg (ref 32.0–48.0)
pH, Arterial: 7.13 — CL (ref 7.350–7.450)
pH, Arterial: 7.38 (ref 7.350–7.450)
pO2, Arterial: 43 mmHg — ABNORMAL LOW (ref 83.0–108.0)
pO2, Arterial: 82 mmHg — ABNORMAL LOW (ref 83.0–108.0)

## 2020-05-15 LAB — URINALYSIS, ROUTINE W REFLEX MICROSCOPIC
Bacteria, UA: NONE SEEN
Bilirubin Urine: NEGATIVE
Glucose, UA: NEGATIVE mg/dL
Ketones, ur: NEGATIVE mg/dL
Leukocytes,Ua: NEGATIVE
Nitrite: NEGATIVE
Protein, ur: 100 mg/dL — AB
Specific Gravity, Urine: 1.026 (ref 1.005–1.030)
pH: 5 (ref 5.0–8.0)

## 2020-05-15 LAB — CBC
HCT: 47.8 % (ref 39.0–52.0)
Hemoglobin: 15.6 g/dL (ref 13.0–17.0)
MCH: 29.3 pg (ref 26.0–34.0)
MCHC: 32.6 g/dL (ref 30.0–36.0)
MCV: 89.7 fL (ref 80.0–100.0)
Platelets: 350 10*3/uL (ref 150–400)
RBC: 5.33 MIL/uL (ref 4.22–5.81)
RDW: 13.2 % (ref 11.5–15.5)
WBC: 16.3 10*3/uL — ABNORMAL HIGH (ref 4.0–10.5)
nRBC: 0 % (ref 0.0–0.2)

## 2020-05-15 LAB — SARS CORONAVIRUS 2 BY RT PCR (HOSPITAL ORDER, PERFORMED IN ~~LOC~~ HOSPITAL LAB): SARS Coronavirus 2: NEGATIVE

## 2020-05-15 LAB — GLUCOSE, CAPILLARY: Glucose-Capillary: 160 mg/dL — ABNORMAL HIGH (ref 70–99)

## 2020-05-15 LAB — APTT: aPTT: 34 seconds (ref 24–36)

## 2020-05-15 LAB — ETHANOL: Alcohol, Ethyl (B): <10 mg/dL (ref <10)

## 2020-05-15 MED ORDER — FENTANYL CITRATE (PF) 100 MCG/2ML IJ SOLN
50.0000 ug | INTRAMUSCULAR | Status: AC | PRN
  Administered 2020-05-15 – 2020-05-16 (×3): 50 ug via INTRAVENOUS
  Filled 2020-05-15 (×3): qty 2

## 2020-05-15 MED ORDER — POLYETHYLENE GLYCOL 3350 17 G PO PACK: 17.0000 g | PACK | Freq: Every day | ORAL | Status: DC | PRN

## 2020-05-15 MED ORDER — POLYETHYLENE GLYCOL 3350 17 G PO PACK
17.0000 g | PACK | Freq: Every day | ORAL | Status: DC
  Administered 2020-05-16 – 2020-05-21 (×3): 17 g via ORAL
  Filled 2020-05-15 (×3): qty 1

## 2020-05-15 MED ORDER — DIPHENHYDRAMINE HCL 50 MG/ML IJ SOLN: 25.0000 mg | Freq: Once | INTRAMUSCULAR | Status: DC

## 2020-05-15 MED ORDER — DOCUSATE SODIUM 50 MG/5ML PO LIQD
100.0000 mg | Freq: Two times a day (BID) | ORAL | Status: DC
  Administered 2020-05-16 – 2020-05-21 (×5): 100 mg via ORAL
  Filled 2020-05-15 (×11): qty 10

## 2020-05-15 MED ORDER — PROPOFOL 1000 MG/100ML IV EMUL: 0.0000 ug/kg/min | INTRAVENOUS | Status: DC

## 2020-05-15 MED ORDER — MIDAZOLAM HCL 2 MG/2ML IJ SOLN
1.0000 mg | INTRAMUSCULAR | Status: DC | PRN
  Filled 2020-05-15: qty 2

## 2020-05-15 MED ORDER — DIPHENHYDRAMINE HCL 50 MG/ML IJ SOLN: 50.0000 mg | Freq: Once | INTRAMUSCULAR | Status: AC

## 2020-05-15 MED ORDER — MIDAZOLAM HCL 2 MG/2ML IJ SOLN: 1.0000 mg | INTRAMUSCULAR | Status: DC | PRN

## 2020-05-15 MED ORDER — SODIUM CHLORIDE 0.9 % IV BOLUS
1000.0000 mL | Freq: Once | INTRAVENOUS | Status: AC
  Administered 2020-05-15: 1000 mL via INTRAVENOUS

## 2020-05-15 MED ORDER — FENTANYL BOLUS VIA INFUSION
25.0000 ug | INTRAVENOUS | Status: DC | PRN
  Filled 2020-05-15: qty 25

## 2020-05-15 MED ORDER — SODIUM CHLORIDE 0.9 % IV SOLN
2000.0000 mg | Freq: Once | INTRAVENOUS | Status: AC
  Administered 2020-05-15: 2000 mg via INTRAVENOUS
  Filled 2020-05-15: qty 20

## 2020-05-15 MED ORDER — HYDROMORPHONE HCL 1 MG/ML IJ SOLN
1.0000 mg | Freq: Once | INTRAMUSCULAR | Status: AC
  Administered 2020-05-15: 1 mg via INTRAVENOUS
  Filled 2020-05-15: qty 1

## 2020-05-15 MED ORDER — IOHEXOL 350 MG/ML SOLN
100.0000 mL | Freq: Once | INTRAVENOUS | Status: AC | PRN
  Administered 2020-05-15: 100 mL via INTRAVENOUS

## 2020-05-15 MED ORDER — SUCCINYLCHOLINE CHLORIDE 20 MG/ML IJ SOLN
150.0000 mg | Freq: Once | INTRAMUSCULAR | Status: AC
  Administered 2020-05-15: 150 mg via INTRAVENOUS
  Filled 2020-05-15: qty 1

## 2020-05-15 MED ORDER — FAMOTIDINE IN NACL 20-0.9 MG/50ML-% IV SOLN
20.0000 mg | Freq: Two times a day (BID) | INTRAVENOUS | Status: DC
  Administered 2020-05-16 – 2020-05-21 (×11): 20 mg via INTRAVENOUS
  Filled 2020-05-15 (×12): qty 50

## 2020-05-15 MED ORDER — DOCUSATE SODIUM 100 MG PO CAPS: 100.0000 mg | ORAL_CAPSULE | Freq: Two times a day (BID) | ORAL | Status: DC | PRN

## 2020-05-15 MED ORDER — DIPHENHYDRAMINE HCL 50 MG/ML IJ SOLN
INTRAMUSCULAR | Status: AC
  Administered 2020-05-15: 50 mg via INTRAVENOUS
  Filled 2020-05-15: qty 1

## 2020-05-15 MED ORDER — FENTANYL CITRATE (PF) 100 MCG/2ML IJ SOLN: 25.0000 ug | Freq: Once | INTRAMUSCULAR | Status: DC

## 2020-05-15 MED ORDER — FENTANYL CITRATE (PF) 100 MCG/2ML IJ SOLN: 50.0000 ug | INTRAMUSCULAR | Status: DC | PRN

## 2020-05-15 MED ORDER — ETOMIDATE 2 MG/ML IV SOLN
20.0000 mg | Freq: Once | INTRAVENOUS | Status: AC
  Administered 2020-05-15: 20 mg via INTRAVENOUS
  Filled 2020-05-15: qty 10

## 2020-05-15 MED ORDER — SODIUM CHLORIDE 0.9 % IV BOLUS: 1000.0000 mL | Freq: Once | INTRAVENOUS | Status: DC

## 2020-05-15 MED ORDER — ASPIRIN 300 MG RE SUPP
300.0000 mg | Freq: Once | RECTAL | Status: AC
  Administered 2020-05-15: 300 mg via RECTAL
  Filled 2020-05-15: qty 1

## 2020-05-15 MED ORDER — PROPOFOL 1000 MG/100ML IV EMUL
5.0000 ug/kg/min | INTRAVENOUS | Status: DC
  Administered 2020-05-15: 5 ug/kg/min via INTRAVENOUS
  Administered 2020-05-16 (×2): 35 ug/kg/min via INTRAVENOUS
  Administered 2020-05-16 (×2): 40 ug/kg/min via INTRAVENOUS
  Administered 2020-05-16: 35 ug/kg/min via INTRAVENOUS
  Administered 2020-05-17: 34.929 ug/kg/min via INTRAVENOUS
  Filled 2020-05-15 (×8): qty 100

## 2020-05-15 MED ORDER — IPRATROPIUM-ALBUTEROL 0.5-2.5 (3) MG/3ML IN SOLN
3.0000 mL | Freq: Four times a day (QID) | RESPIRATORY_TRACT | Status: DC
  Administered 2020-05-16 – 2020-05-17 (×7): 3 mL via RESPIRATORY_TRACT
  Filled 2020-05-15 (×7): qty 3

## 2020-05-15 MED ORDER — HEPARIN SODIUM (PORCINE) 5000 UNIT/ML IJ SOLN
5000.0000 [IU] | Freq: Three times a day (TID) | INTRAMUSCULAR | Status: DC
  Administered 2020-05-16 – 2020-05-18 (×8): 5000 [IU] via SUBCUTANEOUS
  Filled 2020-05-15 (×8): qty 1

## 2020-05-15 MED ORDER — FENTANYL 2500MCG IN NS 250ML (10MCG/ML) PREMIX INFUSION
25.0000 ug/h | INTRAVENOUS | Status: DC
  Administered 2020-05-16: 25 ug/h via INTRAVENOUS
  Filled 2020-05-15: qty 250

## 2020-05-15 MED ORDER — PROPOFOL 1000 MG/100ML IV EMUL
INTRAVENOUS | Status: AC
  Administered 2020-05-15: 5 ug/kg/min via INTRAVENOUS
  Filled 2020-05-15: qty 100

## 2020-05-15 NOTE — ED Notes (Signed)
When arrived to pt room he had removed nasal trumpet and non-rebreather   Pt is able to follow this nurse with his eyes but is still non-verbal and does not follow commands  Pt is maintaining his own airway and O2 sat 95%

## 2020-05-15 NOTE — Progress Notes (Signed)
Orders for intubation. Patient pre-oxygenated with 100% O2. Patient successfully intubated on first attempt, using a #4 glidescope blade with a 8.0 ETT. Secured at 24cm at the lip. Positive color change on EtCO2. Bilateral breath sounds equal. CXR ordered and pending. Patient placed on mechanical ventilation per MD order. SpO2 is 98%. Will continue to monitor.

## 2020-05-15 NOTE — ED Notes (Addendum)
Verbal order given EDP for bolus propofol of 20 mcg d/t pt waking up and attempting to self extubate. 20 mcg bolus given.

## 2020-05-15 NOTE — ED Provider Notes (Signed)
[facility] Emergency Department Provider Note  ____________________________________________   First MD Initiated Contact with Patient 05/15/20 1733     (approximate)  I have reviewed the triage vital signs and the nursing notes.   HISTORY  Chief Complaint Code Stroke, Altered Mental Status, and Seizures    HPI Corey Meyers is a 69 y.o. 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  The patient was last seen normal approximately 5 hours ago on arrival.  Per report from the patient's wife, he has had some residual mild speech difficulty and right-sided weakness since his stroke.  However, when she returned from errands today, the patient was found acutely confused and unable to converse.  He would not move his right side at all.  EMS was called and they were initially going to [facility].  However, in route, the patient had a witnessed generalized seizure and was diverted here.  He has been postictal after approximately 2-2 and half minutes of seizure-like activity.  He did not receive any antiepileptics.  No other complaints.      Rhonda Knerr: Wife [phone]  Past Medical History:  Diagnosis Date  . Hyperlipidemia   . Hypertension   . Stroke [facility])     Patient Active Problem List   Diagnosis Date Noted  . Seizure (HCC) 05/15/2020  . 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 Essential hypertension 12/27/2018  . Hyperlipidemia 12/27/2018  . NSTEMI (non-ST elevated myocardial infarction) (HCC) 12/27/2018  . Chronic systolic CHF (congestive heart failure) (HCC) 12/27/2018  . 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    Past Surgical History:  Procedure Laterality Date  . IR CT HEAD LTD  12/21/2018  . IR  PERCUTANEOUS ART THROMBECTOMY/INFUSION INTRACRANIAL INC DIAG ANGIO  12/21/2018  . LAMINECTOMY    . RADIOLOGY WITH ANESTHESIA N/A 12/21/2018   Procedure: RADIOLOGY WITH ANESTHESIA;  Surgeon: Julieanne Cotton, MD;  Location: [facility] OR;  Service: Radiology;  Laterality: N/A;  . TONSILLECTOMY      Prior to Admission medications   Medication Sig Start Date End Date Taking? Authorizing Provider  atorvastatin (LIPITOR) 80 MG tablet Take 1 tablet (80 mg total) by mouth daily at 6 PM. 12/27/18  Yes Biby, Jani Files, NP  carvedilol (COREG) 12.5 MG tablet Take 12.5 mg by mouth 2 (two) times daily. 03/11/20  Yes [provider]  XARELTO 20 MG TABS tablet Take 20 mg by mouth daily. 03/11/20  Yes [provider]  apixaban (ELIQUIS) 5 MG TABS tablet Take 1 tablet (5 mg total) by mouth 2 (two) times daily. Patient not taking: Reported on 05/15/2020 12/27/18   Layne Benton, NP  feeding supplement, ENSURE ENLIVE, (ENSURE ENLIVE) LIQD Take 237 mLs by mouth daily at 3 pm. Patient not taking: Reported on 05/15/2020 12/27/18   Layne Benton, NP  losartan (COZAAR) 25 MG tablet Take 1 tablet (25 mg total) by mouth daily. Patient not taking: Reported on 05/15/2020 12/28/18   Layne Benton, NP  nitroGLYCERIN (NITROSTAT) 0.4 MG SL tablet Place 0.4 mg under the tongue every 5 (five) minutes as needed for chest pain.  01/03/17   [provider]    Allergies Patient has no known allergies.  No family history on file.  Social History Social History   Tobacco Use  .  Smoking status: Former Games developermoker  . Smokeless tobacco: Never Used  Vaping Use  . Vaping Use: Never used  Substance Use Topics  . Alcohol use: Yes  . Drug use: Not on file    Review of Systems  Review of Systems  Unable to perform ROS: Mental status change  Neurological: Positive for weakness.  Psychiatric/Behavioral: Positive for confusion.     ____________________________________________  PHYSICAL EXAM:      VITAL SIGNS: ED  Triage Vitals  Enc Vitals Group     BP      Pulse      Resp      Temp      Temp src      SpO2      Weight      Height      Head Circumference      Peak Flow      Pain Score      Pain Loc      Pain Edu?      Excl. in GC?      Physical Exam Vitals and nursing note reviewed.  Constitutional:      General: He is not in acute distress.    Appearance: He is well-developed. He is ill-appearing.  HENT:     Head: Normocephalic.     Mouth/Throat:     Mouth: Mucous membranes are dry.  Eyes:     Conjunctiva/sclera: Conjunctivae normal.  Cardiovascular:     Rate and Rhythm: Normal rate and regular rhythm.     Heart sounds: Normal heart sounds.  Pulmonary:     Effort: Pulmonary effort is normal. No respiratory distress.     Breath sounds: No wheezing.  Abdominal:     General: There is no distension.  Musculoskeletal:     Cervical back: Neck supple.  Skin:    General: Skin is warm.     Capillary Refill: Capillary refill takes less than 2 seconds.     Findings: No rash.  Neurological:     Mental Status: He is lethargic.     GCS: GCS eye subscore is 2. GCS verbal subscore is 1. GCS motor subscore is 1.     Comments: GCS 4-5 on arrival. Tone normal throughout with no apparent seizure activity. Gag reflex is intact. Spontaneous respirations noted. No blink to threat. Pinpoint pupils bilaterally, no apparent gaze preference.       ____________________________________________   LABS (all labs ordered are listed, but only abnormal results are displayed)  Labs Reviewed  PROTIME-INR - Abnormal; Notable for the following components:      Result Value   Prothrombin Time 20.1 (*)    INR 1.8 (*)    All other components within normal limits  CBC - Abnormal; Notable for the following components:   WBC 16.3 (*)    All other components within normal limits  DIFFERENTIAL - Abnormal; Notable for the following components:   Neutro Abs 11.1 (*)    Lymphs Abs 4.1 (*)    Abs Immature  Granulocytes 0.08 (*)    All other components within normal limits  COMPREHENSIVE METABOLIC PANEL - Abnormal; Notable for the following components:   CO2 15 (*)    Glucose, Bld 161 (*)    Total Protein 8.2 (*)    GFR calc non Af Amer 60 (*)    Anion gap 23 (*)    All other components within normal limits  URINALYSIS, ROUTINE W REFLEX MICROSCOPIC - Abnormal; Notable for the following components:  Color, Urine YELLOW (*)    APPearance HAZY (*)    Hgb urine dipstick SMALL (*)    Protein, ur 100 (*)    All other components within normal limits  GLUCOSE, CAPILLARY - Abnormal; Notable for the following components:   Glucose-Capillary 160 (*)    All other components within normal limits  BLOOD GAS, ARTERIAL - Abnormal; Notable for the following components:   pH, Arterial 7.13 (*)    pCO2 arterial 86 (*)    pO2, Arterial 43 (*)    Bicarbonate 28.6 (*)    Acid-base deficit 3.9 (*)    All other components within normal limits  BLOOD GAS, ARTERIAL - Abnormal; Notable for the following components:   pO2, Arterial 82 (*)    All other components within normal limits  CBC - Abnormal; Notable for the following components:   WBC 13.7 (*)    All other components within normal limits  PHOSPHORUS - Abnormal; Notable for the following components:   Phosphorus 2.1 (*)    All other components within normal limits  SARS CORONAVIRUS 2 BY RT PCR (HOSPITAL ORDER, PERFORMED IN [facility])  ETHANOL  APTT  URINE DRUG SCREEN, QUALITATIVE ([facility] ONLY)  CREATININE, SERUM  MAGNESIUM  PROCALCITONIN  TRIGLYCERIDES  HIV ANTIBODY (ROUTINE TESTING W REFLEX)  CBC  BASIC METABOLIC PANEL  BLOOD GAS, ARTERIAL  MAGNESIUM  PHOSPHORUS  BLOOD GAS, ARTERIAL  PROCALCITONIN  I-STAT CREATININE, ED    ____________________________________________  EKG: Normal sinus rhythm. VR 70, QRS 110, QTc 472. ST flattening lead V2, query re-entry pattern. No ST depressions, no reciprocal  changes. ________________________________________  RADIOLOGY All imaging, including plain films, CT scans, and ultrasounds, independently reviewed by me, and interpretations confirmed via formal radiology reads.  ED MD interpretation:   CT Angio Head/Neck: Old left MCA occlusion, possible thrombus in carotid which remains widely patent   Official radiology report(s): CT Angio Head W or Wo Contrast  Result Date: 05/15/2020 CLINICAL DATA:  Acute neuro deficit EXAM: CT ANGIOGRAPHY HEAD AND NECK CT PERFUSION BRAIN TECHNIQUE: Multidetector CT imaging of the head and neck was performed using the standard protocol during bolus administration of intravenous contrast. Multiplanar CT image reconstructions and MIPs were obtained to evaluate the vascular anatomy. Carotid stenosis measurements (when applicable) are obtained utilizing NASCET criteria, using the distal internal carotid diameter as the denominator. Multiphase CT imaging of the brain was performed following IV bolus contrast injection. Subsequent parametric perfusion maps were calculated using RAPID software. CONTRAST:  OMNIPAQUE IOHEXOL 350 MG/ML SOLN COMPARISON:  MRI head 12/22/2018.  CT angio head and neck 12/21/2018 FINDINGS: CT HEAD FINDINGS Brain: Ventricle size and cerebral volume normal. Chronic infarct left frontal parietal lobe. Small chronic infarct in the left anterior basal ganglia Vascular: Negative for hyperdense vessel Skull: Negative Sinuses/Orbits: Mucosal edema paranasal sinuses. Air-fluid level in the sphenoid sinus. Negative orbit Other: None ASPECTS (Alberta Stroke Program Early CT Score) - Ganglionic level infarction (caudate, lentiform nuclei, internal capsule, insula, M1-M3 cortex): 7 - Supraganglionic infarction (M4-M6 cortex): 3 Total score (0-10 with 10 being normal): 10 Review of the MIP images confirms the above findings CTA NECK FINDINGS Aortic arch: Standard branching. Imaged portion shows no evidence of aneurysm or  dissection. No significant stenosis of the major arch vessel origins. Right carotid system: No significant stenosis. Minimal atherosclerotic calcification right carotid bulb Left carotid system: No significant stenosis. Noncalcified plaque or thrombus left carotid bulb without stenosis. Vertebral arteries: Both vertebral arteries are normal Skeleton:  Mild degenerative changes cervical spine. No acute skeletal abnormality. Poor dentition Other neck: Negative for mass or adenopathy in the neck. Upper chest: Lung apices clear bilaterally. Review of the MIP images confirms the above findings CTA HEAD FINDINGS Anterior circulation: Mild atherosclerotic calcification in the cavernous carotid bilaterally. No significant stenosis. Anterior and middle cerebral arteries normal bilaterally without stenosis or large vessel occlusion. Posterior circulation: Both vertebral arteries patent to the basilar. PICA patent bilaterally. Basilar widely patent. AICA, superior cerebellar, posterior cerebral arteries patent bilaterally without stenosis or large vessel occlusion. Venous sinuses: Normal venous enhancement Anatomic variants: None Review of the MIP images confirms the above findings CT Brain Perfusion Findings: ASPECTS: 10 CBF (<30%) Volume: 0mL Perfusion (Tmax>6.0s) volume: 0mL Mismatch Volume: 0mL Infarction Location:None IMPRESSION: 1. No acute intracranial abnormality. Chronic infarcts on the left. Aspects 10 2. No significant right carotid stenosis. Minimal atherosclerotic calcification right carotid bulb. 3. No significant left carotid stenosis. There is a small area of noncalcified soft tissue along the wall of the bulb on the left which may be due to atherosclerotic plaque or adherent thrombus. This was not present on the prior CTA. 4. Both vertebral arteries are normal 5. No intracranial large vessel occlusion or significant stenosis. 6. These results were called by telephone at the time of interpretation on 05/15/2020  at 6:20 pm to provider Shaune Pollack , who verbally acknowledged these results. Electronically Signed   By: Marlan Palau M.D.   On: 05/15/2020 18:16   CT Angio Neck W and/or Wo Contrast  Result Date: 05/15/2020 CLINICAL DATA:  Acute neuro deficit EXAM: CT ANGIOGRAPHY HEAD AND NECK CT PERFUSION BRAIN TECHNIQUE: Multidetector CT imaging of the head and neck was performed using the standard protocol during bolus administration of intravenous contrast. Multiplanar CT image reconstructions and MIPs were obtained to evaluate the vascular anatomy. Carotid stenosis measurements (when applicable) are obtained utilizing NASCET criteria, using the distal internal carotid diameter as the denominator. Multiphase CT imaging of the brain was performed following IV bolus contrast injection. Subsequent parametric perfusion maps were calculated using RAPID software. CONTRAST:  OMNIPAQUE IOHEXOL 350 MG/ML SOLN COMPARISON:  MRI head 12/22/2018.  CT angio head and neck 12/21/2018 FINDINGS: CT HEAD FINDINGS Brain: Ventricle size and cerebral volume normal. Chronic infarct left frontal parietal lobe. Small chronic infarct in the left anterior basal ganglia Vascular: Negative for hyperdense vessel Skull: Negative Sinuses/Orbits: Mucosal edema paranasal sinuses. Air-fluid level in the sphenoid sinus. Negative orbit Other: None ASPECTS (Alberta Stroke Program Early CT Score) - Ganglionic level infarction (caudate, lentiform nuclei, internal capsule, insula, M1-M3 cortex): 7 - Supraganglionic infarction (M4-M6 cortex): 3 Total score (0-10 with 10 being normal): 10 Review of the MIP images confirms the above findings CTA NECK FINDINGS Aortic arch: Standard branching. Imaged portion shows no evidence of aneurysm or dissection. No significant stenosis of the major arch vessel origins. Right carotid system: No significant stenosis. Minimal atherosclerotic calcification right carotid bulb Left carotid system: No significant stenosis.  Noncalcified plaque or thrombus left carotid bulb without stenosis. Vertebral arteries: Both vertebral arteries are normal Skeleton: Mild degenerative changes cervical spine. No acute skeletal abnormality. Poor dentition Other neck: Negative for mass or adenopathy in the neck. Upper chest: Lung apices clear bilaterally. Review of the MIP images confirms the above findings CTA HEAD FINDINGS Anterior circulation: Mild atherosclerotic calcification in the cavernous carotid bilaterally. No significant stenosis. Anterior and middle cerebral arteries normal bilaterally without stenosis or large vessel occlusion. Posterior circulation: Both vertebral arteries patent  to the basilar. PICA patent bilaterally. Basilar widely patent. AICA, superior cerebellar, posterior cerebral arteries patent bilaterally without stenosis or large vessel occlusion. Venous sinuses: Normal venous enhancement Anatomic variants: None Review of the MIP images confirms the above findings CT Brain Perfusion Findings: ASPECTS: 10 CBF (<30%) Volume: 68mL Perfusion (Tmax>6.0s) volume: 54mL Mismatch Volume: 61mL Infarction Location:None IMPRESSION: 1. No acute intracranial abnormality. Chronic infarcts on the left. Aspects 10 2. No significant right carotid stenosis. Minimal atherosclerotic calcification right carotid bulb. 3. No significant left carotid stenosis. There is a small area of noncalcified soft tissue along the wall of the bulb on the left which may be due to atherosclerotic plaque or adherent thrombus. This was not present on the prior CTA. 4. Both vertebral arteries are normal 5. No intracranial large vessel occlusion or significant stenosis. 6. These results were called by telephone at the time of interpretation on 05/15/2020 at 6:20 pm to provider Shaune Pollack , who verbally acknowledged these results. Electronically Signed   By: Marlan Palau M.D.   On: 05/15/2020 18:16   CT CEREBRAL PERFUSION W CONTRAST  Result Date:  05/15/2020 CLINICAL DATA:  Acute neuro deficit EXAM: CT ANGIOGRAPHY HEAD AND NECK CT PERFUSION BRAIN TECHNIQUE: Multidetector CT imaging of the head and neck was performed using the standard protocol during bolus administration of intravenous contrast. Multiplanar CT image reconstructions and MIPs were obtained to evaluate the vascular anatomy. Carotid stenosis measurements (when applicable) are obtained utilizing NASCET criteria, using the distal internal carotid diameter as the denominator. Multiphase CT imaging of the brain was performed following IV bolus contrast injection. Subsequent parametric perfusion maps were calculated using RAPID software. CONTRAST:  OMNIPAQUE IOHEXOL 350 MG/ML SOLN COMPARISON:  MRI head 12/22/2018.  CT angio head and neck 12/21/2018 FINDINGS: CT HEAD FINDINGS Brain: Ventricle size and cerebral volume normal. Chronic infarct left frontal parietal lobe. Small chronic infarct in the left anterior basal ganglia Vascular: Negative for hyperdense vessel Skull: Negative Sinuses/Orbits: Mucosal edema paranasal sinuses. Air-fluid level in the sphenoid sinus. Negative orbit Other: None ASPECTS (Alberta Stroke Program Early CT Score) - Ganglionic level infarction (caudate, lentiform nuclei, internal capsule, insula, M1-M3 cortex): 7 - Supraganglionic infarction (M4-M6 cortex): 3 Total score (0-10 with 10 being normal): 10 Review of the MIP images confirms the above findings CTA NECK FINDINGS Aortic arch: Standard branching. Imaged portion shows no evidence of aneurysm or dissection. No significant stenosis of the major arch vessel origins. Right carotid system: No significant stenosis. Minimal atherosclerotic calcification right carotid bulb Left carotid system: No significant stenosis. Noncalcified plaque or thrombus left carotid bulb without stenosis. Vertebral arteries: Both vertebral arteries are normal Skeleton: Mild degenerative changes cervical spine. No acute skeletal abnormality.  Poor dentition Other neck: Negative for mass or adenopathy in the neck. Upper chest: Lung apices clear bilaterally. Review of the MIP images confirms the above findings CTA HEAD FINDINGS Anterior circulation: Mild atherosclerotic calcification in the cavernous carotid bilaterally. No significant stenosis. Anterior and middle cerebral arteries normal bilaterally without stenosis or large vessel occlusion. Posterior circulation: Both vertebral arteries patent to the basilar. PICA patent bilaterally. Basilar widely patent. AICA, superior cerebellar, posterior cerebral arteries patent bilaterally without stenosis or large vessel occlusion. Venous sinuses: Normal venous enhancement Anatomic variants: None Review of the MIP images confirms the above findings CT Brain Perfusion Findings: ASPECTS: 10 CBF (<30%) Volume: 93mL Perfusion (Tmax>6.0s) volume: 53mL Mismatch Volume: 43mL Infarction Location:None IMPRESSION: 1. No acute intracranial abnormality. Chronic infarcts on the left. Aspects 10 2.  No significant right carotid stenosis. Minimal atherosclerotic calcification right carotid bulb. 3. No significant left carotid stenosis. There is a small area of noncalcified soft tissue along the wall of the bulb on the left which may be due to atherosclerotic plaque or adherent thrombus. This was not present on the prior CTA. 4. Both vertebral arteries are normal 5. No intracranial large vessel occlusion or significant stenosis. 6. These results were called by telephone at the time of interpretation on 05/15/2020 at 6:20 pm to provider Shaune Pollack , who verbally acknowledged these results. Electronically Signed   By: Marlan Palau M.D.   On: 05/15/2020 18:16   DG Chest Portable 1 View  Result Date: 05/15/2020 CLINICAL DATA:  Endotracheal tube and orogastric tube placement. EXAM: PORTABLE CHEST 1 VIEW COMPARISON:  May 15, 2020 (7:37 p.m.) FINDINGS: An endotracheal tube is seen with its distal tip approximately 5.8 cm from  the carina. A nasogastric tube is noted with its distal end extending below the level of the diaphragm. There is no evidence of acute infiltrate, pleural effusion or pneumothorax. The cardiac silhouette is moderately enlarged. Degenerative changes seen throughout the thoracic spine. IMPRESSION: 1. Endotracheal tube and nasogastric tube in satisfactory position. Electronically Signed   By: Aram Candela M.D.   On: 05/15/2020 21:49   DG Chest Portable 1 View  Result Date: 05/15/2020 CLINICAL DATA:  Code stroke EXAM: PORTABLE CHEST 1 VIEW COMPARISON:  12/22/2018 FINDINGS: Cardiomegaly, vascular congestion and interstitial prominence may reflect interstitial edema. Bibasilar atelectasis. No effusions. No acute bony abnormality. IMPRESSION: Cardiomegaly with vascular congestion and probable mild interstitial edema. Electronically Signed   By: Charlett Nose M.D.   On: 05/15/2020 19:51   DG Abd Portable 1 View  Result Date: 05/15/2020 CLINICAL DATA:  Orogastric tube placement. EXAM: PORTABLE ABDOMEN - 1 VIEW COMPARISON:  None. FINDINGS: A nasogastric tube is seen with its distal tip overlying the expected region of the gastric antrum. Dilated small bowel loops are seen overlying the mid to lower left abdomen. No radio-opaque calculi or other significant radiographic abnormality are seen. IMPRESSION: Nasogastric tube positioning, as described above. Electronically Signed   By: Aram Candela M.D.   On: 05/15/2020 21:54    ____________________________________________  PROCEDURES   Procedure(s) performed (including Critical Care):  .Critical Care Performed by: Shaune Pollack, MD Authorized by: Shaune Pollack, MD   Critical care provider statement:    Critical care time (minutes):  75   Critical care time was exclusive of:  Separately billable procedures and treating other patients and teaching time   Critical care was necessary to treat or prevent imminent or life-threatening deterioration of  the following conditions:  Cardiac failure, circulatory failure, respiratory failure and CNS failure or compromise   Critical care was time spent personally by me on the following activities:  Development of treatment plan with patient or surrogate, discussions with consultants, evaluation of patient's response to treatment, examination of patient, obtaining history from patient or surrogate, ordering and performing treatments and interventions, ordering and review of laboratory studies, ordering and review of radiographic studies, pulse oximetry, re-evaluation of patient's condition and review of old charts   I assumed direction of critical care for this patient from another provider in my specialty: no   Procedure Name: Intubation Date/Time: 05/16/2020 1:43 AM Performed by: Shaune Pollack, MD Pre-anesthesia Checklist: Patient identified, Patient being monitored, Emergency Drugs available, Timeout performed and Suction available Oxygen Delivery Method: Non-rebreather mask Preoxygenation: Pre-oxygenation with 100% oxygen Induction Type: Rapid sequence Ventilation:  Mask ventilation without difficulty Laryngoscope Size: Glidescope and 4 Grade View: Grade I Tube size: 8.0 mm Number of attempts: 1 Airway Equipment and Method: Rigid stylet and Video-laryngoscopy Placement Confirmation: ETT inserted through vocal cords under direct vision,  CO2 detector and Breath sounds checked- equal and bilateral Secured at: 24 cm Tube secured with: ETT holder Dental Injury: Teeth and Oropharynx as per pre-operative assessment  Difficulty Due To: Difficulty was unanticipated Future Recommendations: Recommend- induction with short-acting agent, and alternative techniques readily available       ____________________________________________  INITIAL IMPRESSION / MDM / ASSESSMENT AND PLAN / ED COURSE  As part of my medical decision making, I reviewed the following data within the electronic MEDICAL RECORD NUMBER  Nursing notes reviewed and incorporated, Old chart reviewed, Notes from prior ED visits, and [place] Controlled Substance Database       *Sinjin Amero Bradly was evaluated in Emergency Department on 05/16/2020 for the symptoms described in the history of present illness. He was evaluated in the context of the global COVID-19 pandemic, which necessitated consideration that the patient might be at risk for infection with the SARS-CoV-2 virus that causes COVID-19. Institutional protocols and algorithms that pertain to the evaluation of patients at risk for COVID-19 are in a state of rapid change based on information released by regulatory bodies including the CDC and federal and state organizations. These policies and algorithms were followed during the patient's care in the ED.  Some ED evaluations and interventions may be delayed as a result of limited staffing during the pandemic.*     Medical Decision Making:  69 yo M who arrives as a CODE STROKE after being found minimally responsive with apparent R-sided weakness prior to arrival. LKN approx 5 hours prior to assessment. Pt arrives post-ictal after GTC seizure with EMS. Concern for LVO CVA vs seizure w/ postictal paralysis/somnolence. Pt post-ictal but protecting airway initially, so taken to CT scanner with RN and teleneuro consult.   CT shows no new LVO. No ICH. Labs show moderate leukocytosis and AG acidosis which I suspect is 2/2 his seizure. Case discussed with teleneurologist. Recommends MRI/stroke admission with routine EEG given absence of any ongoing seizure activity at this time.  Given pt's persistent somnolence and post-ictal confusion, VBG obtained which shows evidence of resp acidosis. Decision made to intubate - tolerated well. CXR confirms appropriate placement. Will admit to ICU for ongoing tx. Propofol and fentanyl ordered.  The entire case including imaging, plan of care, plan for intubation, was discussed with wife over multiple telephone  calls by myself. She is in agreement with current plan to admit to [facility] ICU.  ____________________________________________  FINAL CLINICAL IMPRESSION(S) / ED DIAGNOSES  Final diagnoses:  Stroke-like episode  Acute respiratory failure with hypercapnia (HCC)  Generalized seizure (HCC)     MEDICATIONS GIVEN DURING THIS VISIT:  Medications  propofol (DIPRIVAN) 1000 MG/100ML infusion (50 mcg/kg/min  110.7 kg Intravenous Rate/Dose Change 05/16/20 0125)  fentaNYL (SUBLIMAZE) injection 50 mcg (has no administration in time range)  polyethylene glycol (MIRALAX / GLYCOLAX) packet 17 g (has no administration in time range)  heparin injection 5,000 Units (5,000 Units Subcutaneous Given 05/16/20 0028)  docusate sodium (COLACE) capsule 100 mg (has no administration in time range)  famotidine (PEPCID) IVPB 20 mg premix (20 mg Intravenous New Bag/Given 05/16/20 0029)  ipratropium-albuterol (DUONEB) 0.5-2.5 (3) MG/3ML nebulizer solution 3 mL (has no administration in time range)  docusate (COLACE) 50 MG/5ML liquid 100 mg (100 mg Oral Not Given 05/15/20  2328)  polyethylene glycol (MIRALAX / GLYCOLAX) packet 17 g (has no administration in time range)  fentaNYL in NS (46mcg/ml) infusion-PREMIX (25 mcg/hr Intravenous New Bag/Given 05/16/20 0038)  midazolam (VERSED) injection 1 mg (has no administration in time range)  midazolam (VERSED) injection 1 mg (has no administration in time range)  sodium chloride 0.9 % bolus 1,000 mL (0 mLs Intravenous Stopped 05/15/20 1918)  iohexol (OMNIPAQUE) 350 MG/ML injection 100 mL (100 mLs Intravenous Contrast Given 05/15/20 1744)  levETIRAcetam (KEPPRA) 2,000 mg in sodium chloride 0.9 % 250 mL IVPB (0 mg Intravenous Stopped 05/15/20 1914)  aspirin suppository 300 mg (300 mg Rectal Given 05/15/20 2145)  etomidate (AMIDATE) injection 20 mg (20 mg Intravenous Given 05/15/20 2108)  succinylcholine (ANECTINE) injection 150 mg (150 mg Intravenous Given 05/15/20 2108)    fentaNYL (SUBLIMAZE) injection 50 mcg (50 mcg Intravenous Given 05/16/20 0017)  HYDROmorphone (DILAUDID) injection 1 mg (1 mg Intravenous Given 05/15/20 2155)  diphenhydrAMINE (BENADRYL) injection 50 mg (50 mg Intravenous Given 05/15/20 2229)     ED Discharge Orders    None       Note:  This document was prepared using Dragon voice recognition software and may include unintentional dictation errors.   Shaune Pollack, MD 05/16/20 [phone]

## 2020-05-15 NOTE — ED Notes (Signed)
EDP @ bedside 

## 2020-05-15 NOTE — ED Notes (Addendum)
14 F OG tube inserted by [employer]. 14 F foley catheter inserted by Janice Coffin RN

## 2020-05-15 NOTE — Consult Note (Addendum)
Addendum 1: No new or acute large vessel occlusion corresponding to clinical presentation is reported on head and neck CTA, not a candidate for mechanical thrombectomy.     ///     [facility] TeleNeurology Consult Services   Date of Service:   05/15/2020 18:00:58  Impression:     .  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 Head CT: No acute Intracranial hemorrhage. NIHSS 27 likely inflated due to altered mental status Etiology for Symptoms/presentation is most likely seizure (higher risk due to previous left MCA stroke), but cannot rule out a cortical stroke that may have lead to a seizure, given obtundation examination is not showing any focality, last time known well>4.5 hours and also is on Eliquis therefore not a candidate for Thrombolysis. STAT head and neck CTA was done to investigate for Large Vessel Occlusion. Recommend loading and maintaining on Antiepileptic Drug such as Keppra and admission for workup. should do Noncontrast brain MRI ASAP and hold anticoagulation till stroke is ruled out.  Metrics: Last Known Well: 05/15/2020 12:00:00 [facility] Notification Time: 05/15/2020 18:00:58 Arrival Time: 05/15/2020 17:23:00 Stamp Time: 05/15/2020 18:00:58 Time First Login Attempt: 05/15/2020 18:05:01 Symptoms: seizure. NIHSS Start Assessment Time: 05/15/2020 18:11:06 Patient is not a candidate for Thrombolytic. Thrombolytic Medical Decision: 05/15/2020 18:07:29 Patient was not deemed candidate for Thrombolytic because of following reasons: Last Well Known Above 4.5 Hours. Use of NOAs within 48 hours. Coagulopathy. Other Diagnosis suspected.  CT head was reviewed.  ED Physician notified of diagnostic impression and management plan on 05/15/2020 18:25:20  Advanced Imaging: CTA Head and Neck Recommended:  CTP Recommended:   Our  recommendations are outlined below.  Recommendations:     .  Activate Stroke Protocol Admission/Order Set     .  Stroke/Telemetry Floor     .  Neuro Checks     .  Bedside Swallow Eval     .  DVT Prophylaxis     .  IV Fluids, Normal Saline     .  Head of Bed 30 Degrees     .  Euglycemia and Avoid Hyperthermia (PRN Acetaminophen)     .  given previous stroke he is high risk of further seizure activity, recommend loading on keppra 1000mg  x1 and maintain on 500mg  BID.      routine EEG     .  Hold anticoagulation for now till Noncontrast brain MRI is done and shows whether there is Acute Ischemic Stroke or not, if acute ischemic stroke is seen then may need to hold anticoagulation for longer period depending on the size of the Stroke. In the meanwhile if brain MRI is going to be delayed more than 24 hours (and if head CT is reported negative for Intracranial Hemorrhage and if INR is not supratherapeutic and there is no contraindication) should be started on Antiplatelet therapy such as aspirin , once anticoagulation is resumed can stop Antiplatelet therapy if there is no specific indication for combining Antiplatelet therapy and anticoagulation therapies. It is preferred that MRI is done ASAP so anticoagulation is not disrupted unnecessarily.  Routine Consultation with Inhouse Neurology for Follow up Care  Sign Out:     .  Discussed with Emergency Department Provider    ------------------------------------------------------------------------------  History of Present Illness: Patient is a 69 year old Male.  Patient was brought by EMS for symptoms of seizure.  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 Wife saw him "not normal" at 16:00, EMS reports seizure en route lasting 2:30 minutes, spontaneous  resolution, now is post ictal. Anticoagulation or Antiplatelet use: Eliquis Chart reviewed for Pertinent medical, surgical, family history. Chart reviewed for Medications list and allergies. Pertinent positive and negative review of systems noted in History of Present Illness. When possible Patient/Family Consented to Telemedicine evaluation. When applicable I personally reviewed images of Head CT or MRI.   Past Medical History:     . Coronary Artery Disease     . Stroke  Anticoagulant use:  Eliquis     Examination: BP(103/60), Pulse(75), Blood Glucose(pending) 1A: Level of Consciousness - Movements to Pain + 2 1B: Ask Month and Age - Could Not Answer Either Question Correctly + 2 1C: Blink Eyes & Squeeze Hands - Performs 0 Tasks + 2 2: Test Horizontal Extraocular Movements - Normal + 0 3: Test Visual Fields - No Visual Loss + 0 4: Test Facial Palsy (Use Grimace if Obtunded) - Normal symmetry + 0 5A: Test Left Arm Motor Drift - No Movement + 4 5B: Test Right Arm Motor Drift - No Movement + 4 6A: Test Left Leg Motor Drift - No Movement + 4 6B: Test Right Leg Motor Drift - No Movement + 4 7: Test Limb Ataxia (FNF/Heel-Shin) - No Ataxia + 0 8: Test Sensation - Normal; No sensory loss + 0 9: Test Language/Aphasia - Mute/Global Aphasia: No Usable Speech/Auditory Comprehension + 3 10: Test Dysarthria - Mute/Anarthric + 2 11: Test Extinction/Inattention - No abnormality + 0  NIHSS Score: 27  Pre-Morbid Modified Rankin Scale: Unable to assess   Patient/Family was informed the Neurology Consult would occur via TeleHealth consult by way of interactive audio and video telecommunications and consented to receiving care in this manner.   Patient is being evaluated for possible acute neurologic impairment and high probability of imminent or life-threatening deterioration. I spent total of 35 minutes providing care to this patient, including time for face to face visit via telemedicine,  review of medical records, imaging studies and discussion of findings with providers, the patient and/or family.   Dr Driscilla Grammes   [facility] [phone]  Case [id number]

## 2020-05-15 NOTE — ED Notes (Addendum)
Decrease in appearance of hives noted on chest, abdomen, and neck.

## 2020-05-15 NOTE — ED Notes (Addendum)
Hives noted on pt's neck and upper chest area. EDP informed, verbal order for benadryl.

## 2020-05-15 NOTE — ED Notes (Signed)
Nasal trumpet inserted by Dr Penne Lash   Pt placed on zoll monitor

## 2020-05-15 NOTE — ED Notes (Signed)
Upon assessment, pt noted to have O2 sats at 81%. Pt placed on Non-rebreather mask. EDP notified and bedside.  O2 Sats increased to 100% on non-rebreather. Pt able to open eyes to voice, able to follow this RN's finger with eyes briefly.

## 2020-05-15 NOTE — ED Notes (Addendum)
Timeout performed by EDP. This RN, RT, and 2 ED RN's bedside. consent received by EDP from pt's wife and pt is informed of procedure.

## 2020-05-15 NOTE — ED Notes (Addendum)
Pos color change noted, 71mm ETT secured at lip at 24.

## 2020-05-15 NOTE — Progress Notes (Signed)
Spoke with Pt's spouse over the phone, she confirmed that pt is not taking eliquis and is on xarelto. She also expressed concern on pt staying on his routine of medications 10 am and 10 pm, which is PCP has requested according to wife.

## 2020-05-15 NOTE — ED Notes (Signed)
Neurologist said no TPA

## 2020-05-15 NOTE — ED Notes (Signed)
Bolus of propofol given d/t pt removing IVs and attempting to self extubate.

## 2020-05-15 NOTE — ED Notes (Signed)
Pt waking up on ETT, gave pt bolus propofol.

## 2020-05-15 NOTE — ED Notes (Signed)
Pt placed on 2L via [place] at this time. Pt maintaining 98% on RA.

## 2020-05-15 NOTE — Progress Notes (Signed)
Pharmacy Electrolyte Monitoring Consult:  Pharmacy consulted to assist in monitoring and replacing electrolytes in this 69 y.o. male admitted on 05/15/2020 with Code Stroke, Altered Mental Status, and Seizures   Labs:  Sodium (mmol/L)  Date Value  05/15/2020 140  02/12/2014 135 (L)   Potassium (mmol/L)  Date Value  05/15/2020 4.8  02/12/2014 3.5   Magnesium (mg/dL)  Date Value  62/37/6283 2.0   Phosphorus (mg/dL)  Date Value  15/17/6160 2.9   Calcium (mg/dL)  Date Value  73/71/0626 9.3   Calcium, Total (mg/dL)  Date Value  94/85/4627 8.4 (L)   Albumin (g/dL)  Date Value  03/50/0938 4.1  02/12/2014 3.0 (L)    Assessment/Plan: Electrolytes currently WNL will recheck w/ am labs and reassess and replace as needed.  Thomasene Ripple, PharmD, BCPS Clinical Pharmacist 05/15/2020 11:02 PM

## 2020-05-15 NOTE — ED Notes (Signed)
CARELINK  CALLED PER   DR  ISAACS  MD 

## 2020-05-16 ENCOUNTER — Inpatient Hospital Stay (HOSPITAL_COMMUNITY)
Admit: 2020-05-16 | Discharge: 2020-05-16 | Disposition: A | Discharge status: Home / Self Care | Payer: Medicare HMO | Attending: Adult Health | Admitting: Adult Health

## 2020-05-16 ENCOUNTER — Inpatient Hospital Stay: Payer: Medicare HMO

## 2020-05-16 DIAGNOSIS — I361 Nonrheumatic tricuspid (valve) insufficiency: Secondary | ICD-10-CM

## 2020-05-16 LAB — CBC
HCT: 39.1 % (ref 39.0–52.0)
HCT: 42.7 % (ref 39.0–52.0)
Hemoglobin: 13.3 g/dL (ref 13.0–17.0)
Hemoglobin: 14.5 g/dL (ref 13.0–17.0)
MCH: 29.4 pg (ref 26.0–34.0)
MCH: 29.5 pg (ref 26.0–34.0)
MCHC: 34 g/dL (ref 30.0–36.0)
MCHC: 34 g/dL (ref 30.0–36.0)
MCV: 86.3 fL (ref 80.0–100.0)
MCV: 87 fL (ref 80.0–100.0)
Platelets: 269 10*3/uL (ref 150–400)
Platelets: 281 10*3/uL (ref 150–400)
RBC: 4.53 MIL/uL (ref 4.22–5.81)
RBC: 4.91 MIL/uL (ref 4.22–5.81)
RDW: 13.1 % (ref 11.5–15.5)
RDW: 13.2 % (ref 11.5–15.5)
WBC: 13.7 10*3/uL — ABNORMAL HIGH (ref 4.0–10.5)
WBC: 13.9 10*3/uL — ABNORMAL HIGH (ref 4.0–10.5)
nRBC: 0 % (ref 0.0–0.2)
nRBC: 0 % (ref 0.0–0.2)

## 2020-05-16 LAB — LACTIC ACID, PLASMA
Lactic Acid, Venous: 2.4 mmol/L (ref 0.5–1.9)
Lactic Acid, Venous: 2.5 mmol/L (ref 0.5–1.9)

## 2020-05-16 LAB — BLOOD GAS, ARTERIAL
Acid-base deficit: 0.2 mmol/L (ref 0.0–2.0)
Bicarbonate: 25.4 mmol/L (ref 20.0–28.0)
FIO2: 0.45
MECHVT: 500 mL
O2 Saturation: 98.2 %
PEEP: 5 cmH2O
Patient temperature: 37
RATE: 18 resp/min
pCO2 arterial: 44 mmHg (ref 32.0–48.0)
pH, Arterial: 7.37 (ref 7.350–7.450)
pO2, Arterial: 110 mmHg — ABNORMAL HIGH (ref 83.0–108.0)

## 2020-05-16 LAB — BASIC METABOLIC PANEL
Anion gap: 7 (ref 5–15)
BUN: 13 mg/dL (ref 8–23)
CO2: 25 mmol/L (ref 22–32)
Calcium: 7.9 mg/dL — ABNORMAL LOW (ref 8.9–10.3)
Chloride: 105 mmol/L (ref 98–111)
Creatinine, Ser: 0.81 mg/dL (ref 0.61–1.24)
GFR calc Af Amer: >60 mL/min (ref >60)
GFR calc non Af Amer: >60 mL/min (ref >60)
Glucose, Bld: 89 mg/dL (ref 70–99)
Potassium: 3.3 mmol/L — ABNORMAL LOW (ref 3.5–5.1)
Sodium: 137 mmol/L (ref 135–145)

## 2020-05-16 LAB — MAGNESIUM
Magnesium: 2 mg/dL (ref 1.7–2.4)
Magnesium: 2.3 mg/dL (ref 1.7–2.4)

## 2020-05-16 LAB — CREATININE, SERUM
Creatinine, Ser: 1.08 mg/dL (ref 0.61–1.24)
GFR calc Af Amer: >60 mL/min (ref >60)
GFR calc non Af Amer: >60 mL/min (ref >60)

## 2020-05-16 LAB — GLUCOSE, CAPILLARY
Glucose-Capillary: 68 mg/dL — ABNORMAL LOW (ref 70–99)
Glucose-Capillary: 74 mg/dL (ref 70–99)
Glucose-Capillary: 83 mg/dL (ref 70–99)
Glucose-Capillary: 89 mg/dL (ref 70–99)

## 2020-05-16 LAB — PHOSPHORUS
Phosphorus: 2.1 mg/dL — ABNORMAL LOW (ref 2.5–4.6)
Phosphorus: 4.1 mg/dL (ref 2.5–4.6)

## 2020-05-16 LAB — MRSA PCR SCREENING: MRSA by PCR: NEGATIVE

## 2020-05-16 LAB — PROCALCITONIN
Procalcitonin: <0.1 ng/mL
Procalcitonin: <0.1 ng/mL

## 2020-05-16 LAB — ECHOCARDIOGRAM COMPLETE
Area-P 1/2: 3.72 cm2
Height: 72.008 in
S' Lateral: 4.1 cm
Weight: 4028.25 oz

## 2020-05-16 LAB — TRIGLYCERIDES: Triglycerides: 78 mg/dL (ref <150)

## 2020-05-16 LAB — HIV ANTIBODY (ROUTINE TESTING W REFLEX): HIV Screen 4th Generation wRfx: NONREACTIVE

## 2020-05-16 MED ORDER — ORAL CARE MOUTH RINSE
15.0000 mL | OROMUCOSAL | Status: DC
  Administered 2020-05-16 – 2020-05-17 (×10): 15 mL via OROMUCOSAL

## 2020-05-16 MED ORDER — POTASSIUM CHLORIDE 10 MEQ/100ML IV SOLN
10.0000 meq | INTRAVENOUS | Status: AC
  Administered 2020-05-16 (×2): 10 meq via INTRAVENOUS
  Filled 2020-05-16 (×2): qty 100

## 2020-05-16 MED ORDER — CHLORHEXIDINE GLUCONATE CLOTH 2 % EX PADS
6.0000 | MEDICATED_PAD | Freq: Every day | CUTANEOUS | Status: DC
  Administered 2020-05-17 – 2020-05-20 (×3): 6 via TOPICAL

## 2020-05-16 MED ORDER — CHLORHEXIDINE GLUCONATE 0.12% ORAL RINSE (MEDLINE KIT)
15.0000 mL | Freq: Two times a day (BID) | OROMUCOSAL | Status: DC
  Administered 2020-05-16 – 2020-05-17 (×3): 15 mL via OROMUCOSAL

## 2020-05-16 MED ORDER — LACTATED RINGERS IV BOLUS
1000.0000 mL | Freq: Once | INTRAVENOUS | Status: AC
  Administered 2020-05-16: 1000 mL via INTRAVENOUS

## 2020-05-16 MED ORDER — ADULT MULTIVITAMIN W/MINERALS CH
1.0000 | ORAL_TABLET | Freq: Every day | ORAL | Status: DC
  Administered 2020-05-16 – 2020-05-21 (×4): 1
  Filled 2020-05-16 (×4): qty 1

## 2020-05-16 MED ORDER — VITAL HIGH PROTEIN PO LIQD
1000.0000 mL | ORAL | Status: DC
  Administered 2020-05-16: 1000 mL

## 2020-05-16 MED ORDER — LEVETIRACETAM IN NACL 500 MG/100ML IV SOLN
500.0000 mg | Freq: Two times a day (BID) | INTRAVENOUS | Status: DC
  Administered 2020-05-16 – 2020-05-17 (×3): 500 mg via INTRAVENOUS
  Filled 2020-05-16 (×5): qty 100

## 2020-05-16 MED ORDER — PROSOURCE TF PO LIQD
90.0000 mL | Freq: Four times a day (QID) | ORAL | Status: DC
  Administered 2020-05-16 (×4): 90 mL
  Filled 2020-05-16: qty 90

## 2020-05-16 NOTE — Progress Notes (Signed)
Initial Nutrition Assessment  DOCUMENTATION CODES:   Obesity unspecified  INTERVENTION:  Initiate Vital High Protein @ 35 ml/hr with 90 ml ProSource TF via OGT 4x daily -This regimen will provide 1160 kcal (1599 total kcal with propofol at current rate, meeting 100% of kcal needs), 162 grams protein (100% of protein needs) and 706 ml free water  MVI with minerals daily via tube  Additional free water per MD   NUTRITION DIAGNOSIS:   Inadequate oral intake related to inability to eat as evidenced by NPO status.   GOAL:   Provide needs based on ASPEN/SCCM guidelines    MONITOR:   Vent status, Labs, I & O's, TF tolerance, Weight trends, Skin  REASON FOR ASSESSMENT:   Consult, Ventilator Enteral/tube feeding initiation and management  ASSESSMENT:  RD working remotely.  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  Patient with witnessed seizure, lasting ~2.5 minutes, post-ictal upon arrival. Initial CT head stroke study negative. Due to persistent somnolence, confusion, and acidosis on VBG, pt intubated for airway protection.   Weight history is limited, appears stable. Patient weighed 111.1 kg (244.42 lb) on 12/21/18 as well as on 08/05/17. Currently pt weighs 114.2 kg (251.24 lb)  Patient is currently sedated and intubated on ventilator support MV: 8.7 L/min Temp (24hrs), Avg:98 F (36.7 C), Min:97.7 F (36.5 C), Max:98.2 F (36.8 C)  Propofol: 16.61 ml/hr provides 439 kcal  Medications reviewed and include: Colace, Miralax Drips: Pepcid Keppra KCl 10 mEq Fentanyl 25 mcg  Labs: K 3.3 (L), WBC 13.9 (H) Mg/P - WNL  NUTRITION - FOCUSED PHYSICAL EXAM: Unable to perform at this time, RD working remotely.  Diet Order:   Diet Order            Diet NPO time specified  Diet effective now                 EDUCATION NEEDS:   Not appropriate for  education at this time  Skin:  Skin Assessment: Reviewed RN Assessment  Last BM:  Unknown  Height:   Ht Readings from Last 1 Encounters:  05/16/20 6' 0.01" (1.829 m)    Weight:   Wt Readings from Last 1 Encounters:  05/16/20 114.2 kg    Ideal Body Weight:  80.9 kg  BMI:  Body mass index is 34.14 kg/m.  Estimated Nutritional Needs:   Kcal:  9735-3299  Protein:  162  Fluid:  > 1.2 L/day   Lars Masson, RD, LDN Clinical Nutrition After Hours/Weekend Pager # in Amion

## 2020-05-16 NOTE — Progress Notes (Signed)
Pharmacy Electrolyte Monitoring Consult:  Pharmacy consulted to assist in monitoring and replacing electrolytes in this 69 y.o. male admitted on 05/15/2020 with Code Stroke, Altered Mental Status, and Seizures   Labs:  Sodium (mmol/L)  Date Value  05/16/2020 137  02/12/2014 135 (L)   Potassium (mmol/L)  Date Value  05/16/2020 3.3 (L)  02/12/2014 3.5   Magnesium (mg/dL)  Date Value  62/95/2841 2.0   Phosphorus (mg/dL)  Date Value  32/44/0102 4.1   Calcium (mg/dL)  Date Value  72/53/6644 7.9 (L)   Calcium, Total (mg/dL)  Date Value  03/47/4259 8.4 (L)   Albumin (g/dL)  Date Value  56/38/7564 4.1  02/12/2014 3.0 (L)    Assessment/Plan: Pt intubated for airway protection. Has new focal neurological findings. Will give KCl 10 mEq IV x 2.   F/u with AM labs.   Paschal Dopp, PharmD, BCPS Clinical Pharmacist 05/16/2020 8:28 AM

## 2020-05-16 NOTE — H&P (Signed)
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    SIGNIFICANT EVENTS: 05/15/2020: admitted  HPI: 68 y/o caucasian male with a medical history as indicated below  who presented to the ED with  AMS. History is obtained from ED records as patient is currently intubated and sedated. Patient developed sudden onset confusion and aphasia with right sided weakness. 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 While in the ambulance, patient had a witnessed seizure and was post-ictal upon arrival in the ED. Seizure lasted about 2.5 minutes. At the ED, he remained obtunded. Initial CT head stroke study was negative but patient could not get tPA because he is on eliquis and it had been approximately 5 hrs since symptoms started. Due to persistent somnolence and post-ictal confusion and acidosis on VBG, he was intubated. He is now admitted to the ICU for further management.   Past Medical History:  Diagnosis Date  . Hyperlipidemia   . Hypertension   . Stroke [facility])    Past Surgical History:  Procedure Laterality Date  . IR CT HEAD LTD  12/21/2018  . IR PERCUTANEOUS ART THROMBECTOMY/INFUSION INTRACRANIAL INC DIAG ANGIO  12/21/2018  . LAMINECTOMY    . RADIOLOGY WITH ANESTHESIA N/A 12/21/2018   Procedure: RADIOLOGY WITH ANESTHESIA;  Surgeon: Julieanne Cotton, MD;  Location: [facility] OR;  Service: Radiology;  Laterality: N/A;  . TONSILLECTOMY     No current facility-administered medications on file prior to encounter.   Current Outpatient Medications on File Prior to Encounter  Medication Sig  . atorvastatin (LIPITOR) 80 MG tablet Take 1 tablet (80 mg total) by mouth daily at 6 PM.  . carvedilol (COREG) 12.5 MG tablet Take 12.5 mg by mouth 2 (two) times daily.  Carlena Hurl 20 MG TABS  tablet Take 20 mg by mouth daily.  Marland Kitchen apixaban (ELIQUIS) 5 MG TABS tablet Take 1 tablet (5 mg total) by mouth 2 (two) times daily. (Patient not taking: Reported on 05/15/2020)  . feeding supplement, ENSURE ENLIVE, (ENSURE ENLIVE) LIQD Take 237 mLs by mouth daily at 3 pm. (Patient not taking: Reported on 05/15/2020)  . losartan (COZAAR) 25 MG tablet Take 1 tablet (25 mg total) by mouth daily. (Patient not taking: Reported on 05/15/2020)  . nitroGLYCERIN (NITROSTAT) 0.4 MG SL tablet Place 0.4 mg under the tongue every 5 (five) minutes as needed for chest pain.     Allergies No Known Allergies  Family History No family history on file. Social History  reports that he has quit smoking. He has never used smokeless tobacco. He reports current alcohol use. No history on file for drug use.  Review Of Systems:  Unable to obtain  VITAL SIGNS: BP (!) 88/64   Pulse 63   Temp 98.1 F (36.7 C) (Axillary)   Resp 18   Ht 6' 0.01" (1.829 m)   Wt 114.2 kg   SpO2 100%   BMI 34.14 kg/m   HEMODYNAMICS:    VENTILATOR SETTINGS: Vent Mode: PRVC FiO2 (%):  [45 %-55 %] 45 % Set Rate:  [18 bmp] 18 bmp Vt Set:  [500 mL] 500 mL PEEP:  [5 cmH20] 5 cmH20  INTAKE / OUTPUT: No intake/output data recorded.  PHYSICAL EXAMINATION: General:  Obese, in NAD HEENT: PERRLA, neck is short, ETT in  place Neuro:  Sedated, with draws to noxious stimulus Cardiovascular:  RRR, S1/S2. No MRG, +2 pulses Lungs: CTAB Abdomen:  OBESE, normal bowel sounds, no organomegally Musculoskeletal:  +rom, NO DEFORMITIES Skin:  Warm, dry and intact  LABS:  BMET Recent Labs  Lab 05/15/20 1730 05/15/20 2348 05/16/20 0442  NA 140  --  137  K 4.8  --  3.3*  CL 102  --  105  CO2 15*  --  25  BUN 14  --  13  CREATININE 1.23 1.08 0.81  GLUCOSE 161*  --  89    Electrolytes Recent Labs  Lab 05/15/20 1730 05/15/20 2348 05/16/20 0442  CALCIUM 9.3  --  7.9*  MG  --  2.3 2.0  PHOS  --  2.1* 4.1    CBC Recent Labs   Lab 05/15/20 1730 05/15/20 2348 05/16/20 0442  WBC 16.3* 13.7* 13.9*  HGB 15.6 14.5 13.3  HCT 47.8 42.7 39.1  PLT 350 281 269    Coag's Recent Labs  Lab 05/15/20 1730  APTT 34  INR 1.8*    Sepsis Markers Recent Labs  Lab 05/15/20 2348 05/16/20 0442  PROCALCITON <0.10 <0.10    ABG Recent Labs  Lab 05/15/20 2003 05/15/20 2200 05/16/20 0500  PHART 7.13* 7.38 7.37  PCO2ART 86* 45 44  PO2ART 43* 82* 110*    Liver Enzymes Recent Labs  Lab 05/15/20 1730  AST 36  ALT 17  ALKPHOS 82  BILITOT 0.7  ALBUMIN 4.1    Cardiac Enzymes No results for input(s): TROPONINI, PROBNP in the last 168 hours.  Glucose Recent Labs  Lab 05/15/20 1726  GLUCAP 160*    Imaging CT Angio Head W or Wo Contrast  Result Date: 05/15/2020 CLINICAL DATA:  Acute neuro deficit EXAM: CT ANGIOGRAPHY HEAD AND NECK CT PERFUSION BRAIN TECHNIQUE: Multidetector CT imaging of the head and neck was performed using the standard protocol during bolus administration of intravenous contrast. Multiplanar CT image reconstructions and MIPs were obtained to evaluate the vascular anatomy. Carotid stenosis measurements (when applicable) are obtained utilizing NASCET criteria, using the distal internal carotid diameter as the denominator. Multiphase CT imaging of the brain was performed following IV bolus contrast injection. Subsequent parametric perfusion maps were calculated using RAPID software. CONTRAST:  OMNIPAQUE IOHEXOL 350 MG/ML SOLN COMPARISON:  MRI head 12/22/2018.  CT angio head and neck 12/21/2018 FINDINGS: CT HEAD FINDINGS Brain: Ventricle size and cerebral volume normal. Chronic infarct left frontal parietal lobe. Small chronic infarct in the left anterior basal ganglia Vascular: Negative for hyperdense vessel Skull: Negative Sinuses/Orbits: Mucosal edema paranasal sinuses. Air-fluid level in the sphenoid sinus. Negative orbit Other: None ASPECTS (Alberta Stroke Program Early CT Score) -  Ganglionic level infarction (caudate, lentiform nuclei, internal capsule, insula, M1-M3 cortex): 7 - Supraganglionic infarction (M4-M6 cortex): 3 Total score (0-10 with 10 being normal): 10 Review of the MIP images confirms the above findings CTA NECK FINDINGS Aortic arch: Standard branching. Imaged portion shows no evidence of aneurysm or dissection. No significant stenosis of the major arch vessel origins. Right carotid system: No significant stenosis. Minimal atherosclerotic calcification right carotid bulb Left carotid system: No significant stenosis. Noncalcified plaque or thrombus left carotid bulb without stenosis. Vertebral arteries: Both vertebral arteries are normal Skeleton: Mild degenerative changes cervical spine. No acute skeletal abnormality. Poor dentition Other neck: Negative for mass or adenopathy in the neck. Upper chest: Lung apices clear bilaterally. Review of the MIP images confirms the above findings CTA HEAD FINDINGS  Anterior circulation: Mild atherosclerotic calcification in the cavernous carotid bilaterally. No significant stenosis. Anterior and middle cerebral arteries normal bilaterally without stenosis or large vessel occlusion. Posterior circulation: Both vertebral arteries patent to the basilar. PICA patent bilaterally. Basilar widely patent. AICA, superior cerebellar, posterior cerebral arteries patent bilaterally without stenosis or large vessel occlusion. Venous sinuses: Normal venous enhancement Anatomic variants: None Review of the MIP images confirms the above findings CT Brain Perfusion Findings: ASPECTS: 10 CBF (<30%) Volume: 35mL Perfusion (Tmax>6.0s) volume: 39mL Mismatch Volume: 31mL Infarction Location:None IMPRESSION: 1. No acute intracranial abnormality. Chronic infarcts on the left. Aspects 10 2. No significant right carotid stenosis. Minimal atherosclerotic calcification right carotid bulb. 3. No significant left carotid stenosis. There is a small area of noncalcified soft  tissue along the wall of the bulb on the left which may be due to atherosclerotic plaque or adherent thrombus. This was not present on the prior CTA. 4. Both vertebral arteries are normal 5. No intracranial large vessel occlusion or significant stenosis. 6. These results were called by telephone at the time of interpretation on 05/15/2020 at 6:20 pm to provider Shaune Pollack , who verbally acknowledged these results. Electronically Signed   By: Marlan Palau M.D.   On: 05/15/2020 18:16   CT Angio Neck W and/or Wo Contrast  Result Date: 05/15/2020 CLINICAL DATA:  Acute neuro deficit EXAM: CT ANGIOGRAPHY HEAD AND NECK CT PERFUSION BRAIN TECHNIQUE: Multidetector CT imaging of the head and neck was performed using the standard protocol during bolus administration of intravenous contrast. Multiplanar CT image reconstructions and MIPs were obtained to evaluate the vascular anatomy. Carotid stenosis measurements (when applicable) are obtained utilizing NASCET criteria, using the distal internal carotid diameter as the denominator. Multiphase CT imaging of the brain was performed following IV bolus contrast injection. Subsequent parametric perfusion maps were calculated using RAPID software. CONTRAST:  OMNIPAQUE IOHEXOL 350 MG/ML SOLN COMPARISON:  MRI head 12/22/2018.  CT angio head and neck 12/21/2018 FINDINGS: CT HEAD FINDINGS Brain: Ventricle size and cerebral volume normal. Chronic infarct left frontal parietal lobe. Small chronic infarct in the left anterior basal ganglia Vascular: Negative for hyperdense vessel Skull: Negative Sinuses/Orbits: Mucosal edema paranasal sinuses. Air-fluid level in the sphenoid sinus. Negative orbit Other: None ASPECTS (Alberta Stroke Program Early CT Score) - Ganglionic level infarction (caudate, lentiform nuclei, internal capsule, insula, M1-M3 cortex): 7 - Supraganglionic infarction (M4-M6 cortex): 3 Total score (0-10 with 10 being normal): 10 Review of the MIP images  confirms the above findings CTA NECK FINDINGS Aortic arch: Standard branching. Imaged portion shows no evidence of aneurysm or dissection. No significant stenosis of the major arch vessel origins. Right carotid system: No significant stenosis. Minimal atherosclerotic calcification right carotid bulb Left carotid system: No significant stenosis. Noncalcified plaque or thrombus left carotid bulb without stenosis. Vertebral arteries: Both vertebral arteries are normal Skeleton: Mild degenerative changes cervical spine. No acute skeletal abnormality. Poor dentition Other neck: Negative for mass or adenopathy in the neck. Upper chest: Lung apices clear bilaterally. Review of the MIP images confirms the above findings CTA HEAD FINDINGS Anterior circulation: Mild atherosclerotic calcification in the cavernous carotid bilaterally. No significant stenosis. Anterior and middle cerebral arteries normal bilaterally without stenosis or large vessel occlusion. Posterior circulation: Both vertebral arteries patent to the basilar. PICA patent bilaterally. Basilar widely patent. AICA, superior cerebellar, posterior cerebral arteries patent bilaterally without stenosis or large vessel occlusion. Venous sinuses: Normal venous enhancement Anatomic variants: None Review of the MIP images confirms the above  findings CT Brain Perfusion Findings: ASPECTS: 10 CBF (<30%) Volume: 83mL Perfusion (Tmax>6.0s) volume: 76mL Mismatch Volume: 78mL Infarction Location:None IMPRESSION: 1. No acute intracranial abnormality. Chronic infarcts on the left. Aspects 10 2. No significant right carotid stenosis. Minimal atherosclerotic calcification right carotid bulb. 3. No significant left carotid stenosis. There is a small area of noncalcified soft tissue along the wall of the bulb on the left which may be due to atherosclerotic plaque or adherent thrombus. This was not present on the prior CTA. 4. Both vertebral arteries are normal 5. No intracranial large  vessel occlusion or significant stenosis. 6. These results were called by telephone at the time of interpretation on 05/15/2020 at 6:20 pm to provider Shaune Pollack , who verbally acknowledged these results. Electronically Signed   By: Marlan Palau M.D.   On: 05/15/2020 18:16   CT CEREBRAL PERFUSION W CONTRAST  Result Date: 05/15/2020 CLINICAL DATA:  Acute neuro deficit EXAM: CT ANGIOGRAPHY HEAD AND NECK CT PERFUSION BRAIN TECHNIQUE: Multidetector CT imaging of the head and neck was performed using the standard protocol during bolus administration of intravenous contrast. Multiplanar CT image reconstructions and MIPs were obtained to evaluate the vascular anatomy. Carotid stenosis measurements (when applicable) are obtained utilizing NASCET criteria, using the distal internal carotid diameter as the denominator. Multiphase CT imaging of the brain was performed following IV bolus contrast injection. Subsequent parametric perfusion maps were calculated using RAPID software. CONTRAST:  OMNIPAQUE IOHEXOL 350 MG/ML SOLN COMPARISON:  MRI head 12/22/2018.  CT angio head and neck 12/21/2018 FINDINGS: CT HEAD FINDINGS Brain: Ventricle size and cerebral volume normal. Chronic infarct left frontal parietal lobe. Small chronic infarct in the left anterior basal ganglia Vascular: Negative for hyperdense vessel Skull: Negative Sinuses/Orbits: Mucosal edema paranasal sinuses. Air-fluid level in the sphenoid sinus. Negative orbit Other: None ASPECTS (Alberta Stroke Program Early CT Score) - Ganglionic level infarction (caudate, lentiform nuclei, internal capsule, insula, M1-M3 cortex): 7 - Supraganglionic infarction (M4-M6 cortex): 3 Total score (0-10 with 10 being normal): 10 Review of the MIP images confirms the above findings CTA NECK FINDINGS Aortic arch: Standard branching. Imaged portion shows no evidence of aneurysm or dissection. No significant stenosis of the major arch vessel origins. Right carotid system: No  significant stenosis. Minimal atherosclerotic calcification right carotid bulb Left carotid system: No significant stenosis. Noncalcified plaque or thrombus left carotid bulb without stenosis. Vertebral arteries: Both vertebral arteries are normal Skeleton: Mild degenerative changes cervical spine. No acute skeletal abnormality. Poor dentition Other neck: Negative for mass or adenopathy in the neck. Upper chest: Lung apices clear bilaterally. Review of the MIP images confirms the above findings CTA HEAD FINDINGS Anterior circulation: Mild atherosclerotic calcification in the cavernous carotid bilaterally. No significant stenosis. Anterior and middle cerebral arteries normal bilaterally without stenosis or large vessel occlusion. Posterior circulation: Both vertebral arteries patent to the basilar. PICA patent bilaterally. Basilar widely patent. AICA, superior cerebellar, posterior cerebral arteries patent bilaterally without stenosis or large vessel occlusion. Venous sinuses: Normal venous enhancement Anatomic variants: None Review of the MIP images confirms the above findings CT Brain Perfusion Findings: ASPECTS: 10 CBF (<30%) Volume: 23mL Perfusion (Tmax>6.0s) volume: 66mL Mismatch Volume: 15mL Infarction Location:None IMPRESSION: 1. No acute intracranial abnormality. Chronic infarcts on the left. Aspects 10 2. No significant right carotid stenosis. Minimal atherosclerotic calcification right carotid bulb. 3. No significant left carotid stenosis. There is a small area of noncalcified soft tissue along the wall of the bulb on the left which may be due  to atherosclerotic plaque or adherent thrombus. This was not present on the prior CTA. 4. Both vertebral arteries are normal 5. No intracranial large vessel occlusion or significant stenosis. 6. These results were called by telephone at the time of interpretation on 05/15/2020 at 6:20 pm to provider Shaune PollackAMERON ISAACS , who verbally acknowledged these results. Electronically  Signed   By: Marlan Palauharles  Clark M.D.   On: 05/15/2020 18:16   DG Chest Portable 1 View  Result Date: 05/15/2020 CLINICAL DATA:  Endotracheal tube and orogastric tube placement. EXAM: PORTABLE CHEST 1 VIEW COMPARISON:  May 15, 2020 (7:37 p.m.) FINDINGS: An endotracheal tube is seen with its distal tip approximately 5.8 cm from the carina. A nasogastric tube is noted with its distal end extending below the level of the diaphragm. There is no evidence of acute infiltrate, pleural effusion or pneumothorax. The cardiac silhouette is moderately enlarged. Degenerative changes seen throughout the thoracic spine. IMPRESSION: 1. Endotracheal tube and nasogastric tube in satisfactory position. Electronically Signed   By: Aram Candelahaddeus  Houston M.D.   On: 05/15/2020 21:49   DG Chest Portable 1 View  Result Date: 05/15/2020 CLINICAL DATA:  Code stroke EXAM: PORTABLE CHEST 1 VIEW COMPARISON:  12/22/2018 FINDINGS: Cardiomegaly, vascular congestion and interstitial prominence may reflect interstitial edema. Bibasilar atelectasis. No effusions. No acute bony abnormality. IMPRESSION: Cardiomegaly with vascular congestion and probable mild interstitial edema. Electronically Signed   By: Charlett NoseKevin  Dover M.D.   On: 05/15/2020 19:51   DG Abd Portable 1 View  Result Date: 05/15/2020 CLINICAL DATA:  Orogastric tube placement. EXAM: PORTABLE ABDOMEN - 1 VIEW COMPARISON:  None. FINDINGS: A nasogastric tube is seen with its distal tip overlying the expected region of the gastric antrum. Dilated small bowel loops are seen overlying the mid to lower left abdomen. No radio-opaque calculi or other significant radiographic abnormality are seen. IMPRESSION: Nasogastric tube positioning, as described above. Electronically Signed   By: Aram Candelahaddeus  Houston M.D.   On: 05/15/2020 21:54   STUDIES:  EEG Echo on 12/22/2018 IMPRESSIONS    1. Mild hypokinesis of the left ventricular, entire inferior wall.  2. Moderate hypokinesis of the left  ventricular, entire anterior wall.  3. Severe akinesis of the left ventricular, entire apical segment.  4. Severe akinesis of the left ventricular, mid-apical anteroseptal wall.  5. Moderate, fixed thrombus on the apical wall of the left ventricle.  6. The left ventricle has moderately reduced systolic function, with an  ejection fraction of 35-40%. The cavity size was normal. Left ventricular  diastolic function could not be evaluated.  7. The right ventricle has normal systolic function. The cavity was  normal. There is no increase in right ventricular wall thickness. Right  ventricular systolic pressure could not be assessed.  8. Left atrial size was not assessed.  9. The pericardium was not assessed.  10. The aortic valve is grossly normal.  11. When compared to the prior study: Compared to prior reports in Care  Everywhere, LV clot is new. No images for direct comparison.  12. The interatrial septum was not assessed.   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 [facility] ANP-BC Pulmonary and Critical Care Medicine [facility] Pager [phone] or [phone]  NB: This document was prepared using Dragon voice recognition software and may include unintentional dictation errors.    05/16/2020, 6:33 AM

## 2020-05-16 NOTE — H&P (Signed)
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    SIGNIFICANT EVENTS: 05/15/2020: admitted  HPI: 69 y/o caucasian male with a medical history as indicated below  who presented to the ED with  AMS. History is obtained from ED records as patient is currently intubated and sedated. Patient developed sudden onset confusion and aphasia with right sided weakness. 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 While in the ambulance, patient had a witnessed seizure and was post-ictal upon arrival in the ED. Seizure lasted about 2.5 minutes. At the ED, he remained obtunded. Initial CT head stroke study was negative but patient could not get tPA because he is on eliquis and it had been approximately 5 hrs since symptoms started. Due to persistent somnolence and post-ictal confusion and acidosis on VBG, he was intubated. He is now admitted to the ICU for further management.    05/16/20- reviewed care plan with [name] Heidecker (wife of patient), Weaned Fio2 down to 35 in preparation for SBT.    Past Medical History:  Diagnosis Date  . Hyperlipidemia   . Hypertension   . Stroke [facility])    Past Surgical History:  Procedure Laterality Date  . IR CT HEAD LTD  12/21/2018  . IR PERCUTANEOUS ART THROMBECTOMY/INFUSION INTRACRANIAL INC DIAG ANGIO  12/21/2018  . LAMINECTOMY    . RADIOLOGY WITH ANESTHESIA N/A 12/21/2018   Procedure: RADIOLOGY WITH ANESTHESIA;  Surgeon: Julieanne Cotton, MD;  Location: [facility] OR;  Service: Radiology;  Laterality: N/A;  . TONSILLECTOMY     No current facility-administered medications on file prior to encounter.   Current Outpatient Medications on File Prior to Encounter  Medication Sig  . atorvastatin (LIPITOR) 80 MG tablet Take 1 tablet (80 mg total) by mouth  daily at 6 PM.  . carvedilol (COREG) 12.5 MG tablet Take 12.5 mg by mouth 2 (two) times daily.  Carlena Hurl 20 MG TABS tablet Take 20 mg by mouth daily.  Marland Kitchen apixaban (ELIQUIS) 5 MG TABS tablet Take 1 tablet (5 mg total) by mouth 2 (two) times daily. (Patient not taking: Reported on 05/15/2020)  . feeding supplement, ENSURE ENLIVE, (ENSURE ENLIVE) LIQD Take 237 mLs by mouth daily at 3 pm. (Patient not taking: Reported on 05/15/2020)  . losartan (COZAAR) 25 MG tablet Take 1 tablet (25 mg total) by mouth daily. (Patient not taking: Reported on 05/15/2020)  . nitroGLYCERIN (NITROSTAT) 0.4 MG SL tablet Place 0.4 mg under the tongue every 5 (five) minutes as needed for chest pain.     Allergies No Known Allergies  Family History No family history on file. Social History  reports that he has quit smoking. He has never used smokeless tobacco. He reports current alcohol use. No history on file for drug use.  Review Of Systems:  Unable to obtain  VITAL SIGNS: BP 103/71   Pulse 62   Temp 98 F (36.7 C) (Axillary)   Resp 18   Ht 6' 0.01" (1.829 m)   Wt 114.2 kg   SpO2 100%   BMI 34.14 kg/m   HEMODYNAMICS:    VENTILATOR SETTINGS: Vent Mode: PRVC FiO2 (%):  [45 %-55 %] 45 % Set Rate:  [18 bmp] 18 bmp Vt Set:  [500 mL] 500 mL PEEP:  [5 cmH20] 5 cmH20  INTAKE / OUTPUT: I/O last 3 completed shifts: In: 221.9 [I.V.:221.9] Out: 635 [Urine:635]  PHYSICAL EXAMINATION: General:  Obese, in NAD HEENT: PERRLA, ETT in place Neuro:  Sedated, GCS4T Cardiovascular:  RRR, S1/S2. No MRG, +2 pulses Lungs: CTAB Abdomen:  OBESE, normal bowel sounds, no organomegally Musculoskeletal:  Patient sedated Skin:  Warm, dry and intact  LABS:  BMET Recent Labs  Lab 05/15/20 1730 05/15/20 2348 05/16/20 0442  NA 140  --  137  K 4.8  --  3.3*  CL 102  --  105  CO2 15*  --  25  BUN 14  --  13  CREATININE 1.23 1.08 0.81  GLUCOSE 161*  --  89    Electrolytes Recent Labs  Lab 05/15/20 1730  05/15/20 2348 05/16/20 0442  CALCIUM 9.3  --  7.9*  MG  --  2.3 2.0  PHOS  --  2.1* 4.1    CBC Recent Labs  Lab 05/15/20 1730 05/15/20 2348 05/16/20 0442  WBC 16.3* 13.7* 13.9*  HGB 15.6 14.5 13.3  HCT 47.8 42.7 39.1  PLT 350 281 269    Coag's Recent Labs  Lab 05/15/20 1730  APTT 34  INR 1.8*    Sepsis Markers Recent Labs  Lab 05/15/20 2348 05/16/20 0442 05/16/20 1019 05/16/20 1300  LATICACIDVEN  --   --  2.4* 2.5*  PROCALCITON <0.10 <0.10  --   --     ABG Recent Labs  Lab 05/15/20 2003 05/15/20 2200 05/16/20 0500  PHART 7.13* 7.38 7.37  PCO2ART 86* 45 44  PO2ART 43* 82* 110*    Liver Enzymes Recent Labs  Lab 05/15/20 1730  AST 36  ALT 17  ALKPHOS 82  BILITOT 0.7  ALBUMIN 4.1    Cardiac Enzymes No results for input(s): TROPONINI, PROBNP in the last 168 hours.  Glucose Recent Labs  Lab 05/15/20 1726 05/16/20 1236 05/16/20 1556  GLUCAP 160* 68* 89    Imaging CT Angio Head W or Wo Contrast  Result Date: 05/15/2020 CLINICAL DATA:  Acute neuro deficit EXAM: CT ANGIOGRAPHY HEAD AND NECK CT PERFUSION BRAIN TECHNIQUE: Multidetector CT imaging of the head and neck was performed using the standard protocol during bolus administration of intravenous contrast. Multiplanar CT image reconstructions and MIPs were obtained to evaluate the vascular anatomy. Carotid stenosis measurements (when applicable) are obtained utilizing NASCET criteria, using the distal internal carotid diameter as the denominator. Multiphase CT imaging of the brain was performed following IV bolus contrast injection. Subsequent parametric perfusion maps were calculated using RAPID software. CONTRAST:  100mL OMNIPAQUE IOHEXOL 350 MG/ML SOLN COMPARISON:  MRI head 12/22/2018.  CT angio head and neck 12/21/2018 FINDINGS: CT HEAD FINDINGS Brain: Ventricle size and cerebral volume normal. Chronic infarct left frontal parietal lobe. Small chronic infarct in the left anterior basal ganglia  Vascular: Negative for hyperdense vessel Skull: Negative Sinuses/Orbits: Mucosal edema paranasal sinuses. Air-fluid level in the sphenoid sinus. Negative orbit Other: None ASPECTS (Alberta Stroke Program Early CT Score) - Ganglionic level infarction (caudate, lentiform nuclei, internal capsule, insula, M1-M3 cortex): 7 - Supraganglionic infarction (M4-M6 cortex): 3 Total score (0-10 with 10 being normal): 10 Review of the MIP images confirms the above findings CTA NECK FINDINGS Aortic arch: Standard branching. Imaged portion shows no evidence of aneurysm or dissection. No significant stenosis of the major arch vessel origins. Right carotid system: No significant stenosis. Minimal atherosclerotic calcification right carotid bulb Left carotid system: No significant stenosis. Noncalcified plaque or thrombus left carotid bulb without stenosis.  Vertebral arteries: Both vertebral arteries are normal Skeleton: Mild degenerative changes cervical spine. No acute skeletal abnormality. Poor dentition Other neck: Negative for mass or adenopathy in the neck. Upper chest: Lung apices clear bilaterally. Review of the MIP images confirms the above findings CTA HEAD FINDINGS Anterior circulation: Mild atherosclerotic calcification in the cavernous carotid bilaterally. No significant stenosis. Anterior and middle cerebral arteries normal bilaterally without stenosis or large vessel occlusion. Posterior circulation: Both vertebral arteries patent to the basilar. PICA patent bilaterally. Basilar widely patent. AICA, superior cerebellar, posterior cerebral arteries patent bilaterally without stenosis or large vessel occlusion. Venous sinuses: Normal venous enhancement Anatomic variants: None Review of the MIP images confirms the above findings CT Brain Perfusion Findings: ASPECTS: 10 CBF (<30%) Volume: 107mL Perfusion (Tmax>6.0s) volume: 51mL Mismatch Volume: 97mL Infarction Location:None IMPRESSION: 1. No acute intracranial abnormality.  Chronic infarcts on the left. Aspects 10 2. No significant right carotid stenosis. Minimal atherosclerotic calcification right carotid bulb. 3. No significant left carotid stenosis. There is a small area of noncalcified soft tissue along the wall of the bulb on the left which may be due to atherosclerotic plaque or adherent thrombus. This was not present on the prior CTA. 4. Both vertebral arteries are normal 5. No intracranial large vessel occlusion or significant stenosis. 6. These results were called by telephone at the time of interpretation on 05/15/2020 at 6:20 pm to provider Shaune Pollack , who verbally acknowledged these results. Electronically Signed   By: Marlan Palau M.D.   On: 05/15/2020 18:16   CT Angio Neck W and/or Wo Contrast  Result Date: 05/15/2020 CLINICAL DATA:  Acute neuro deficit EXAM: CT ANGIOGRAPHY HEAD AND NECK CT PERFUSION BRAIN TECHNIQUE: Multidetector CT imaging of the head and neck was performed using the standard protocol during bolus administration of intravenous contrast. Multiplanar CT image reconstructions and MIPs were obtained to evaluate the vascular anatomy. Carotid stenosis measurements (when applicable) are obtained utilizing NASCET criteria, using the distal internal carotid diameter as the denominator. Multiphase CT imaging of the brain was performed following IV bolus contrast injection. Subsequent parametric perfusion maps were calculated using RAPID software. CONTRAST:  OMNIPAQUE IOHEXOL 350 MG/ML SOLN COMPARISON:  MRI head 12/22/2018.  CT angio head and neck 12/21/2018 FINDINGS: CT HEAD FINDINGS Brain: Ventricle size and cerebral volume normal. Chronic infarct left frontal parietal lobe. Small chronic infarct in the left anterior basal ganglia Vascular: Negative for hyperdense vessel Skull: Negative Sinuses/Orbits: Mucosal edema paranasal sinuses. Air-fluid level in the sphenoid sinus. Negative orbit Other: None ASPECTS (Alberta Stroke Program Early CT Score)  - Ganglionic level infarction (caudate, lentiform nuclei, internal capsule, insula, M1-M3 cortex): 7 - Supraganglionic infarction (M4-M6 cortex): 3 Total score (0-10 with 10 being normal): 10 Review of the MIP images confirms the above findings CTA NECK FINDINGS Aortic arch: Standard branching. Imaged portion shows no evidence of aneurysm or dissection. No significant stenosis of the major arch vessel origins. Right carotid system: No significant stenosis. Minimal atherosclerotic calcification right carotid bulb Left carotid system: No significant stenosis. Noncalcified plaque or thrombus left carotid bulb without stenosis. Vertebral arteries: Both vertebral arteries are normal Skeleton: Mild degenerative changes cervical spine. No acute skeletal abnormality. Poor dentition Other neck: Negative for mass or adenopathy in the neck. Upper chest: Lung apices clear bilaterally. Review of the MIP images confirms the above findings CTA HEAD FINDINGS Anterior circulation: Mild atherosclerotic calcification in the cavernous carotid bilaterally. No significant stenosis. Anterior and middle cerebral arteries normal bilaterally without stenosis or large  vessel occlusion. Posterior circulation: Both vertebral arteries patent to the basilar. PICA patent bilaterally. Basilar widely patent. AICA, superior cerebellar, posterior cerebral arteries patent bilaterally without stenosis or large vessel occlusion. Venous sinuses: Normal venous enhancement Anatomic variants: None Review of the MIP images confirms the above findings CT Brain Perfusion Findings: ASPECTS: 10 CBF (<30%) Volume: 0mL Perfusion (Tmax>6.0s) volume: 0mL Mismatch Volume: 0mL Infarction Location:None IMPRESSION: 1. No acute intracranial abnormality. Chronic infarcts on the left. Aspects 10 2. No significant right carotid stenosis. Minimal atherosclerotic calcification right carotid bulb. 3. No significant left carotid stenosis. There is a small area of noncalcified  soft tissue along the wall of the bulb on the left which may be due to atherosclerotic plaque or adherent thrombus. This was not present on the prior CTA. 4. Both vertebral arteries are normal 5. No intracranial large vessel occlusion or significant stenosis. 6. These results were called by telephone at the time of interpretation on 05/15/2020 at 6:20 pm to provider Shaune Pollack , who verbally acknowledged these results. Electronically Signed   By: Marlan Palau M.D.   On: 05/15/2020 18:16   CT CEREBRAL PERFUSION W CONTRAST  Result Date: 05/15/2020 CLINICAL DATA:  Acute neuro deficit EXAM: CT ANGIOGRAPHY HEAD AND NECK CT PERFUSION BRAIN TECHNIQUE: Multidetector CT imaging of the head and neck was performed using the standard protocol during bolus administration of intravenous contrast. Multiplanar CT image reconstructions and MIPs were obtained to evaluate the vascular anatomy. Carotid stenosis measurements (when applicable) are obtained utilizing NASCET criteria, using the distal internal carotid diameter as the denominator. Multiphase CT imaging of the brain was performed following IV bolus contrast injection. Subsequent parametric perfusion maps were calculated using RAPID software. CONTRAST:  OMNIPAQUE IOHEXOL 350 MG/ML SOLN COMPARISON:  MRI head 12/22/2018.  CT angio head and neck 12/21/2018 FINDINGS: CT HEAD FINDINGS Brain: Ventricle size and cerebral volume normal. Chronic infarct left frontal parietal lobe. Small chronic infarct in the left anterior basal ganglia Vascular: Negative for hyperdense vessel Skull: Negative Sinuses/Orbits: Mucosal edema paranasal sinuses. Air-fluid level in the sphenoid sinus. Negative orbit Other: None ASPECTS (Alberta Stroke Program Early CT Score) - Ganglionic level infarction (caudate, lentiform nuclei, internal capsule, insula, M1-M3 cortex): 7 - Supraganglionic infarction (M4-M6 cortex): 3 Total score (0-10 with 10 being normal): 10 Review of the MIP images  confirms the above findings CTA NECK FINDINGS Aortic arch: Standard branching. Imaged portion shows no evidence of aneurysm or dissection. No significant stenosis of the major arch vessel origins. Right carotid system: No significant stenosis. Minimal atherosclerotic calcification right carotid bulb Left carotid system: No significant stenosis. Noncalcified plaque or thrombus left carotid bulb without stenosis. Vertebral arteries: Both vertebral arteries are normal Skeleton: Mild degenerative changes cervical spine. No acute skeletal abnormality. Poor dentition Other neck: Negative for mass or adenopathy in the neck. Upper chest: Lung apices clear bilaterally. Review of the MIP images confirms the above findings CTA HEAD FINDINGS Anterior circulation: Mild atherosclerotic calcification in the cavernous carotid bilaterally. No significant stenosis. Anterior and middle cerebral arteries normal bilaterally without stenosis or large vessel occlusion. Posterior circulation: Both vertebral arteries patent to the basilar. PICA patent bilaterally. Basilar widely patent. AICA, superior cerebellar, posterior cerebral arteries patent bilaterally without stenosis or large vessel occlusion. Venous sinuses: Normal venous enhancement Anatomic variants: None Review of the MIP images confirms the above findings CT Brain Perfusion Findings: ASPECTS: 10 CBF (<30%) Volume: 0mL Perfusion (Tmax>6.0s) volume: 0mL Mismatch Volume: 0mL Infarction Location:None IMPRESSION: 1. No acute intracranial abnormality.  Chronic infarcts on the left. Aspects 10 2. No significant right carotid stenosis. Minimal atherosclerotic calcification right carotid bulb. 3. No significant left carotid stenosis. There is a small area of noncalcified soft tissue along the wall of the bulb on the left which may be due to atherosclerotic plaque or adherent thrombus. This was not present on the prior CTA. 4. Both vertebral arteries are normal 5. No intracranial large  vessel occlusion or significant stenosis. 6. These results were called by telephone at the time of interpretation on 05/15/2020 at 6:20 pm to provider Shaune Pollack , who verbally acknowledged these results. Electronically Signed   By: Marlan Palau M.D.   On: 05/15/2020 18:16   DG Chest Portable 1 View  Result Date: 05/15/2020 CLINICAL DATA:  Endotracheal tube and orogastric tube placement. EXAM: PORTABLE CHEST 1 VIEW COMPARISON:  May 15, 2020 (7:37 p.m.) FINDINGS: An endotracheal tube is seen with its distal tip approximately 5.8 cm from the carina. A nasogastric tube is noted with its distal end extending below the level of the diaphragm. There is no evidence of acute infiltrate, pleural effusion or pneumothorax. The cardiac silhouette is moderately enlarged. Degenerative changes seen throughout the thoracic spine. IMPRESSION: 1. Endotracheal tube and nasogastric tube in satisfactory position. Electronically Signed   By: Aram Candela M.D.   On: 05/15/2020 21:49   DG Chest Portable 1 View  Result Date: 05/15/2020 CLINICAL DATA:  Code stroke EXAM: PORTABLE CHEST 1 VIEW COMPARISON:  12/22/2018 FINDINGS: Cardiomegaly, vascular congestion and interstitial prominence may reflect interstitial edema. Bibasilar atelectasis. No effusions. No acute bony abnormality. IMPRESSION: Cardiomegaly with vascular congestion and probable mild interstitial edema. Electronically Signed   By: Charlett Nose M.D.   On: 05/15/2020 19:51   DG Abd Portable 1 View  Result Date: 05/15/2020 CLINICAL DATA:  Orogastric tube placement. EXAM: PORTABLE ABDOMEN - 1 VIEW COMPARISON:  None. FINDINGS: A nasogastric tube is seen with its distal tip overlying the expected region of the gastric antrum. Dilated small bowel loops are seen overlying the mid to lower left abdomen. No radio-opaque calculi or other significant radiographic abnormality are seen. IMPRESSION: Nasogastric tube positioning, as described above. Electronically  Signed   By: Aram Candela M.D.   On: 05/15/2020 21:54   ECHOCARDIOGRAM COMPLETE  Result Date: 05/16/2020    ECHOCARDIOGRAM REPORT   Patient Name:   Corey Meyers Date of Exam: 05/16/2020 Medical Rec #:  [id number]   Height:       72.0 in Accession #:    [id number]  Weight:       251.8 lb Date of Birth:  05/16/1951   BSA:          2.350 m Patient Age:    69 years    BP:           100/66 mmHg Patient Gender: M           HR:           61 bpm. Exam Location:  [facility] Procedure: 2D Echo Indications:     STROKE 434.91/I163.9  History:         Patient has prior history of Echocardiogram examinations, most                  recent 12/22/2018. Previous Myocardial Infarction, Stroke; Risk                  Factors:Hypertension and Dyslipidemia.  Sonographer:     Johnathan Hausen Referring Phys:  [id number]  MAGDALENE S TUKOV-YUAL Diagnosing Phys: Julien Nordmann MD  Sonographer Comments: Technically difficult study due to poor echo windows and suboptimal apical window. IMPRESSIONS  1. Left ventricular ejection fraction, by estimation, is 35 to 40%. The left ventricle has moderately decreased function. Severe hypokinesis of the anterior, anteroseptal and septal region. . Left ventricular diastolic parameters are consistent with Grade II diastolic dysfunction (pseudonormalization).  2. Right ventricular systolic function is normal. The right ventricular size is normal. There is normal pulmonary artery systolic pressure.  3. Left atrial size was mildly dilated. FINDINGS  Left Ventricle: Left ventricular ejection fraction, by estimation, is 35 to 40%. The left ventricle has moderately decreased function. The left ventricle has no regional wall motion abnormalities. Definity contrast agent was given IV to delineate the left ventricular endocardial borders. The left ventricular internal cavity size was normal in size. There is no left ventricular hypertrophy. Left ventricular diastolic parameters are consistent with Grade II diastolic  dysfunction (pseudonormalization). Right Ventricle: The right ventricular size is normal. No increase in right ventricular wall thickness. Right ventricular systolic function is normal. There is normal pulmonary artery systolic pressure. The tricuspid regurgitant velocity is 2.10 m/s, and  with an assumed right atrial pressure of 10 mmHg, the estimated right ventricular systolic pressure is 27.6 mmHg. Left Atrium: Left atrial size was mildly dilated. Right Atrium: Right atrial size was normal in size. Pericardium: There is no evidence of pericardial effusion. Mitral Valve: The mitral valve is normal in structure. Normal mobility of the mitral valve leaflets. No evidence of mitral valve regurgitation. No evidence of mitral valve stenosis. Tricuspid Valve: The tricuspid valve is normal in structure. Tricuspid valve regurgitation is mild . No evidence of tricuspid stenosis. Aortic Valve: The aortic valve was not well visualized. Aortic valve regurgitation is not visualized. No aortic stenosis is present. Pulmonic Valve: The pulmonic valve was normal in structure. Pulmonic valve regurgitation is not visualized. No evidence of pulmonic stenosis. Aorta: The aortic root is normal in size and structure. Venous: The inferior vena cava is normal in size with greater than 50% respiratory variability, suggesting right atrial pressure of 3 mmHg. IAS/Shunts: No atrial level shunt detected by color flow Doppler.  LEFT VENTRICLE PLAX 2D LVIDd:         5.21 cm  Diastology LVIDs:         4.10 cm  LV e' lateral:   11.20 cm/s LV PW:         0.78 cm  LV E/e' lateral: 7.3 LV IVS:        1.03 cm  LV e' medial:    6.96 cm/s LVOT diam:     1.90 cm  LV E/e' medial:  11.8 LVOT Area:     2.84 cm  RIGHT VENTRICLE             IVC RV S prime:     11.10 cm/s  IVC diam: 1.79 cm LEFT ATRIUM             Index       RIGHT ATRIUM           Index LA diam:        4.50 cm 1.92 cm/m  RA Area:     14.60 cm LA Vol (A2C):   32.9 ml 14.00 ml/m RA Volume:    36.20 ml  15.41 ml/m LA Vol (A4C):   37.5 ml 15.96 ml/m LA Biplane Vol: 35.0 ml 14.90 ml/m   AORTA Ao Root diam: 3.40  cm MITRAL VALVE               TRICUSPID VALVE MV Area (PHT): 3.72 cm    TR Peak grad:   17.6 mmHg MV Decel Time: 204 msec    TR Vmax:        210.00 cm/s MV E velocity: 82.00 cm/s MV A velocity: 51.90 cm/s  SHUNTS MV E/A ratio:  1.58        Systemic Diam: 1.90 cm Julien Nordmann MD Electronically signed by Julien Nordmann MD Signature Date/Time: 05/16/2020/3:19:14 PM    Final    STUDIES:  EEG Echo on 12/22/2018 IMPRESSIONS    1. Mild hypokinesis of the left ventricular, entire inferior wall.  2. Moderate hypokinesis of the left ventricular, entire anterior wall.  3. Severe akinesis of the left ventricular, entire apical segment.  4. Severe akinesis of the left ventricular, mid-apical anteroseptal wall.  5. Moderate, fixed thrombus on the apical wall of the left ventricle.  6. The left ventricle has moderately reduced systolic function, with an  ejection fraction of 35-40%. The cavity size was normal. Left ventricular  diastolic function could not be evaluated.  7. The right ventricle has normal systolic function. The cavity was  normal. There is no increase in right ventricular wall thickness. Right  ventricular systolic pressure could not be assessed.  8. Left atrial size was not assessed.  9. The pericardium was not assessed.  10. The aortic valve is grossly normal.  11. When compared to the prior study: Compared to prior reports in Care  Everywhere, LV clot is new. No images for direct comparison.  12. The interatrial septum was not assessed.   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 P:   RASS goal: 0-1 Fentanly and prop for sedation F/U WITH neurology Keppra 500mg  IV Q12 EEG  Best Practice: Code Status:Full code Diet: npo GI prophylaxis: ppi VTE prophylaxis:  All anticoagulants on hold/ SCDs  FAMILY  - Updates: Will be updated after interdisciplinary rounds  Critical care provider statement:    Critical care time (minutes):  33   Critical care time was exclusive of:  Separately billable procedures and  treating other patients   Critical care was necessary to treat or prevent imminent or  life-threatening deterioration of the following conditions:  generalized seizure , acute hypoxemic respiratory failure possibly due to aspiration pna   Critical care was time spent personally by me on the following  activities:  Development of treatment plan with patient or surrogate,  discussions with consultants, evaluation of patient's response to  treatment, examination of patient, obtaining history from patient or  surrogate, ordering and performing treatments and interventions, ordering  and review of laboratory studies and re-evaluation of patient's condition   I assumed direction of critical care for this patient from another  provider in my specialty: no  Vida Rigger, M.D.  Pulmonary & Critical Care Medicine  [facility]    05/16/2020, 5:45 PM

## 2020-05-16 NOTE — Progress Notes (Addendum)
eLink Physician-Brief Progress Note Patient Name: Corey [facility]ers DOB: 1950-11-23 MRN: [id number]   Date of Service  05/16/2020  HPI/Events of Note  Patient with a past hx of CVA admitted to [facility] yesterday through the ED for new focal neurological findings, ?seizures, and altered mental status, CTA head and neck was not demonstrative of a new occlusion so patient was not a candidate for thrombectomy. Patient was intubated for airway protection.  eICU Interventions  New patient evaluation completed.        Thomasene Lot Colleene Swarthout 05/16/2020, 4:54 AM

## 2020-05-17 LAB — BASIC METABOLIC PANEL
Anion gap: 7 (ref 5–15)
BUN: 16 mg/dL (ref 8–23)
CO2: 28 mmol/L (ref 22–32)
Calcium: 8.1 mg/dL — ABNORMAL LOW (ref 8.9–10.3)
Chloride: 101 mmol/L (ref 98–111)
Creatinine, Ser: 0.84 mg/dL (ref 0.61–1.24)
GFR calc Af Amer: >60 mL/min (ref >60)
GFR calc non Af Amer: >60 mL/min (ref >60)
Glucose, Bld: 103 mg/dL — ABNORMAL HIGH (ref 70–99)
Potassium: 3.3 mmol/L — ABNORMAL LOW (ref 3.5–5.1)
Sodium: 136 mmol/L (ref 135–145)

## 2020-05-17 LAB — CBC
HCT: 38.3 % — ABNORMAL LOW (ref 39.0–52.0)
Hemoglobin: 13.1 g/dL (ref 13.0–17.0)
MCH: 29.6 pg (ref 26.0–34.0)
MCHC: 34.2 g/dL (ref 30.0–36.0)
MCV: 86.5 fL (ref 80.0–100.0)
Platelets: 235 10*3/uL (ref 150–400)
RBC: 4.43 MIL/uL (ref 4.22–5.81)
RDW: 13.2 % (ref 11.5–15.5)
WBC: 11.8 10*3/uL — ABNORMAL HIGH (ref 4.0–10.5)
nRBC: 0 % (ref 0.0–0.2)

## 2020-05-17 LAB — MAGNESIUM: Magnesium: 1.8 mg/dL (ref 1.7–2.4)

## 2020-05-17 LAB — GLUCOSE, CAPILLARY
Glucose-Capillary: 103 mg/dL — ABNORMAL HIGH (ref 70–99)
Glucose-Capillary: 105 mg/dL — ABNORMAL HIGH (ref 70–99)
Glucose-Capillary: 80 mg/dL (ref 70–99)
Glucose-Capillary: 93 mg/dL (ref 70–99)
Glucose-Capillary: 94 mg/dL (ref 70–99)
Glucose-Capillary: 96 mg/dL (ref 70–99)
Glucose-Capillary: 97 mg/dL (ref 70–99)

## 2020-05-17 LAB — PROCALCITONIN: Procalcitonin: <0.1 ng/mL

## 2020-05-17 MED ORDER — MAGNESIUM SULFATE 2 GM/50ML IV SOLN
2.0000 g | Freq: Once | INTRAVENOUS | Status: AC
  Administered 2020-05-17: 2 g via INTRAVENOUS
  Filled 2020-05-17: qty 50

## 2020-05-17 MED ORDER — POTASSIUM CHLORIDE 20 MEQ/15ML (10%) PO SOLN
40.0000 meq | Freq: Once | ORAL | Status: AC
  Administered 2020-05-17: 40 meq
  Filled 2020-05-17: qty 30

## 2020-05-17 MED ORDER — LEVETIRACETAM IN NACL 1000 MG/100ML IV SOLN
1000.0000 mg | Freq: Two times a day (BID) | INTRAVENOUS | Status: DC
  Administered 2020-05-17 – 2020-05-18 (×2): 1000 mg via INTRAVENOUS
  Filled 2020-05-17 (×3): qty 100

## 2020-05-17 MED ORDER — ORAL CARE MOUTH RINSE
15.0000 mL | Freq: Two times a day (BID) | OROMUCOSAL | Status: DC
  Administered 2020-05-17 – 2020-05-20 (×6): 15 mL via OROMUCOSAL

## 2020-05-17 MED ORDER — POTASSIUM CHLORIDE 10 MEQ/100ML IV SOLN
10.0000 meq | INTRAVENOUS | Status: AC
  Administered 2020-05-17 (×2): 10 meq via INTRAVENOUS
  Filled 2020-05-17 (×2): qty 100

## 2020-05-17 NOTE — Progress Notes (Signed)
PULMONARY / CRITICAL CARE MEDICINE  Name: Corey [facility]ers MRN: [id number] DOB: 1951-09-03    LOS: 2  Admitting Provider:  Karna Christmas Reason for admission:  New onset seizure Brief patient description:  69 y/o male with a previous CVA presenting with new onset seizure.    SIGNIFICANT EVENTS: 05/15/2020: admitted  HPI: 69 y/o caucasian male with a medical history as indicated below  who presented to the ED with  AMS. History is obtained from ED records as patient is currently intubated and sedated. Patient developed sudden onset confusion and aphasia with right sided weakness. 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 While in the ambulance, patient had a witnessed seizure and was post-ictal upon arrival in the ED. Seizure lasted about 2.5 minutes. At the ED, he remained obtunded. Initial CT head stroke study was negative but patient could not get tPA because he is on eliquis and it had been approximately 5 hrs since symptoms started. Due to persistent somnolence and post-ictal confusion and acidosis on VBG, he was intubated. He is now admitted to the ICU for further management.    05/16/20- reviewed care plan with [name] Olden (wife of patient), Weaned Fio2 down to 35 in preparation for SBT.  05/17/20- patient is liberated from mechanical ventilator.    Past Medical History:  Diagnosis Date  . Hyperlipidemia   . Hypertension   . Stroke [facility])    Past Surgical History:  Procedure Laterality Date  . IR CT HEAD LTD  12/21/2018  . IR PERCUTANEOUS ART THROMBECTOMY/INFUSION INTRACRANIAL INC DIAG ANGIO  12/21/2018  . LAMINECTOMY    . RADIOLOGY WITH ANESTHESIA N/A 12/21/2018   Procedure: RADIOLOGY WITH ANESTHESIA;  Surgeon: Julieanne Cotton, MD;  Location: [facility] OR;  Service: Radiology;  Laterality: N/A;  . TONSILLECTOMY     No current facility-administered medications on file prior to encounter.   Current Outpatient Medications on File Prior to Encounter  Medication Sig  . atorvastatin  (LIPITOR) 80 MG tablet Take 1 tablet (80 mg total) by mouth daily at 6 PM.  . carvedilol (COREG) 12.5 MG tablet Take 12.5 mg by mouth 2 (two) times daily.  Carlena Hurl 20 MG TABS tablet Take 20 mg by mouth daily.  Marland Kitchen apixaban (ELIQUIS) 5 MG TABS tablet Take 1 tablet (5 mg total) by mouth 2 (two) times daily. (Patient not taking: Reported on 05/15/2020)  . feeding supplement, ENSURE ENLIVE, (ENSURE ENLIVE) LIQD Take 237 mLs by mouth daily at 3 pm. (Patient not taking: Reported on 05/15/2020)  . losartan (COZAAR) 25 MG tablet Take 1 tablet (25 mg total) by mouth daily. (Patient not taking: Reported on 05/15/2020)  . nitroGLYCERIN (NITROSTAT) 0.4 MG SL tablet Place 0.4 mg under the tongue every 5 (five) minutes as needed for chest pain.     Allergies No Known Allergies  Family History No family history on file. Social History  reports that he has quit smoking. He has never used smokeless tobacco. He reports current alcohol use. No history on file for drug use.  Review Of Systems:  Unable to obtain  VITAL SIGNS: BP 137/88   Pulse 93   Temp 99.1 F (37.3 C) (Oral)   Resp (!) 21   Ht 6' 0.01" (1.829 m)   Wt 114.2 kg   SpO2 92%   BMI 34.14 kg/m   HEMODYNAMICS:    VENTILATOR SETTINGS: Vent Mode: PRVC FiO2 (%):  [35 %-45 %] 35 % Set Rate:  [16 bmp-18 bmp] 16 bmp Vt Set:  [500  mL] 500 mL PEEP:  [5 cmH20] 5 cmH20 Plateau Pressure:  [16 cmH20] 16 cmH20  INTAKE / OUTPUT: I/O last 3 completed shifts: In: 1247 [I.V.:552; NG/GT:245; IV Piggyback:450] Out: 1485 [Urine:1485]  PHYSICAL EXAMINATION: General:  Obese, in NAD HEENT: PERRLA, ETT in place Neuro:  Sedated, GCS4T Cardiovascular:  RRR, S1/S2. No MRG, +2 pulses Lungs: CTAB Abdomen:  OBESE, normal bowel sounds, no organomegally Musculoskeletal:  Patient sedated Skin:  Warm, dry and intact  LABS:  BMET Recent Labs  Lab 05/15/20 1730 05/15/20 1730 05/15/20 2348 05/16/20 0442 05/17/20 0442  NA 140  --   --  137 136  K  4.8  --   --  3.3* 3.3*  CL 102  --   --  105 101  CO2 15*  --   --  25 28  BUN 14  --   --  13 16  CREATININE 1.23   < > 1.08 0.81 0.84  GLUCOSE 161*  --   --  89 103*   < > = values in this interval not displayed.    Electrolytes Recent Labs  Lab 05/15/20 1730 05/15/20 2348 05/16/20 0442 05/17/20 0442  CALCIUM 9.3  --  7.9* 8.1*  MG  --  2.3 2.0 1.8  PHOS  --  2.1* 4.1  --     CBC Recent Labs  Lab 05/15/20 1730 05/15/20 2348 05/16/20 0442  WBC 16.3* 13.7* 13.9*  HGB 15.6 14.5 13.3  HCT 47.8 42.7 39.1  PLT 350 281 269    Coag's Recent Labs  Lab 05/15/20 1730  APTT 34  INR 1.8*    Sepsis Markers Recent Labs  Lab 05/15/20 2348 05/16/20 0442 05/16/20 1019 05/16/20 1300 05/17/20 0442  LATICACIDVEN  --   --  2.4* 2.5*  --   PROCALCITON <0.10 <0.10  --   --  <0.10    ABG Recent Labs  Lab 05/15/20 2003 05/15/20 2200 05/16/20 0500  PHART 7.13* 7.38 7.37  PCO2ART 86* 45 44  PO2ART 43* 82* 110*    Liver Enzymes Recent Labs  Lab 05/15/20 1730  AST 36  ALT 17  ALKPHOS 82  BILITOT 0.7  ALBUMIN 4.1    Cardiac Enzymes No results for input(s): TROPONINI, PROBNP in the last 168 hours.  Glucose Recent Labs  Lab 05/16/20 1945 05/16/20 2354 05/17/20 0403 05/17/20 0425 05/17/20 0725 05/17/20 1113  GLUCAP 83 74 96 105* 94 97    Imaging MR BRAIN WO CONTRAST  Result Date: 05/17/2020 CLINICAL DATA:  Initial evaluation for acute seizure. EXAM: MRI HEAD WITHOUT CONTRAST TECHNIQUE: Multiplanar, multiecho pulse sequences of the brain and surrounding structures were obtained without intravenous contrast. COMPARISON:  Prior CTs from 05/15/2020. FINDINGS: Brain: Cerebral volume within normal limits for age. Encephalomalacia and gliosis involving the posterior left insula and left frontoparietal region compatible with chronic left MCA territory infarct. Associated lacunar type infarct present at the left caudate head. Scattered areas of associated chronic  hemosiderin staining. Increased T2/FLAIR signal intensity with diffusion abnormality seen involving the mesial left temporal lobe/left hippocampal formation (series 6, image 19), nonspecific, but presumably related to history of seizure. No associated susceptibility artifact or mass effect. No other evidence for acute or subacute infarct. Gray-white matter differentiation maintained elsewhere within the brain. No evidence for acute intracranial hemorrhage. No mass lesion or midline shift. Ex vacuo dilatation of the left lateral ventricle related to the chronic left MCA territory infarcts. No hydrocephalus. No extra-axial fluid collection. T1 hyperintensity noted about the  sella/pituitary gland, nonspecific, but of doubtful significance in the acute setting. Gland itself is relatively normal in appearance. Vascular: Major intracranial vascular flow voids are maintained. Skull and upper cervical spine: Craniocervical junction within normal limits. Bone marrow signal intensity normal. No scalp soft tissue abnormality. Sinuses/Orbits: Globes and orbital soft tissues within normal limits. Moderate mucosal thickening seen throughout the paranasal sinuses, greatest within the ethmoidal air cells. Fluid seen within the posterior nasopharynx. Bilateral mastoid effusions. Patient likely intubated. Other: None. IMPRESSION: 1. Increased signal abnormality involving the mesial left temporal lobe/left hippocampal formation, nonspecific, but presumably related to history of seizure. Correlation with EEG recommended. 2. No other acute intracranial abnormality. 3. Chronic left MCA territory infarcts. Electronically Signed   By: Rise Mu M.D.   On: 05/17/2020 00:22   ECHOCARDIOGRAM COMPLETE  Result Date: 05/16/2020    ECHOCARDIOGRAM REPORT   Patient Name:   Corey Meyers Date of Exam: 05/16/2020 Medical Rec #:  [id number]   Height:       72.0 in Accession #:    [id number]  Weight:       251.8 lb Date of Birth:  Jul 19, 1951    BSA:          2.350 m Patient Age:    69 years    BP:           100/66 mmHg Patient Gender: M           HR:           61 bpm. Exam Location:  [facility] Procedure: 2D Echo Indications:     STROKE 434.91/I163.9  History:         Patient has prior history of Echocardiogram examinations, most                  recent 12/22/2018. Previous Myocardial Infarction, Stroke; Risk                  Factors:Hypertension and Dyslipidemia.  Sonographer:     Johnathan Hausen Referring Phys:  [id number] MAGDALENE S TUKOV-YUAL Diagnosing Phys: Julien Nordmann MD  Sonographer Comments: Technically difficult study due to poor echo windows and suboptimal apical window. IMPRESSIONS  1. Left ventricular ejection fraction, by estimation, is 35 to 40%. The left ventricle has moderately decreased function. Severe hypokinesis of the anterior, anteroseptal and septal region. . Left ventricular diastolic parameters are consistent with Grade II diastolic dysfunction (pseudonormalization).  2. Right ventricular systolic function is normal. The right ventricular size is normal. There is normal pulmonary artery systolic pressure.  3. Left atrial size was mildly dilated. FINDINGS  Left Ventricle: Left ventricular ejection fraction, by estimation, is 35 to 40%. The left ventricle has moderately decreased function. The left ventricle has no regional wall motion abnormalities. Definity contrast agent was given IV to delineate the left ventricular endocardial borders. The left ventricular internal cavity size was normal in size. There is no left ventricular hypertrophy. Left ventricular diastolic parameters are consistent with Grade II diastolic dysfunction (pseudonormalization). Right Ventricle: The right ventricular size is normal. No increase in right ventricular wall thickness. Right ventricular systolic function is normal. There is normal pulmonary artery systolic pressure. The tricuspid regurgitant velocity is 2.10 m/s, and  with an assumed right atrial  pressure of 10 mmHg, the estimated right ventricular systolic pressure is 27.6 mmHg. Left Atrium: Left atrial size was mildly dilated. Right Atrium: Right atrial size was normal in size. Pericardium: There is no evidence of pericardial effusion. Mitral Valve: The mitral valve  is normal in structure. Normal mobility of the mitral valve leaflets. No evidence of mitral valve regurgitation. No evidence of mitral valve stenosis. Tricuspid Valve: The tricuspid valve is normal in structure. Tricuspid valve regurgitation is mild . No evidence of tricuspid stenosis. Aortic Valve: The aortic valve was not well visualized. Aortic valve regurgitation is not visualized. No aortic stenosis is present. Pulmonic Valve: The pulmonic valve was normal in structure. Pulmonic valve regurgitation is not visualized. No evidence of pulmonic stenosis. Aorta: The aortic root is normal in size and structure. Venous: The inferior vena cava is normal in size with greater than 50% respiratory variability, suggesting right atrial pressure of 3 mmHg. IAS/Shunts: No atrial level shunt detected by color flow Doppler.  LEFT VENTRICLE PLAX 2D LVIDd:         5.21 cm  Diastology LVIDs:         4.10 cm  LV e' lateral:   11.20 cm/s LV PW:         0.78 cm  LV E/e' lateral: 7.3 LV IVS:        1.03 cm  LV e' medial:    6.96 cm/s LVOT diam:     1.90 cm  LV E/e' medial:  11.8 LVOT Area:     2.84 cm  RIGHT VENTRICLE             IVC RV S prime:     11.10 cm/s  IVC diam: 1.79 cm LEFT ATRIUM             Index       RIGHT ATRIUM           Index LA diam:        4.50 cm 1.92 cm/m  RA Area:     14.60 cm LA Vol (A2C):   32.9 ml 14.00 ml/m RA Volume:   36.20 ml  15.41 ml/m LA Vol (A4C):   37.5 ml 15.96 ml/m LA Biplane Vol: 35.0 ml 14.90 ml/m   AORTA Ao Root diam: 3.40 cm MITRAL VALVE               TRICUSPID VALVE MV Area (PHT): 3.72 cm    TR Peak grad:   17.6 mmHg MV Decel Time: 204 msec    TR Vmax:        210.00 cm/s MV E velocity: 82.00 cm/s MV A velocity:  51.90 cm/s  SHUNTS MV E/A ratio:  1.58        Systemic Diam: 1.90 cm Julien Nordmann MD Electronically signed by Julien Nordmann MD Signature Date/Time: 05/16/2020/3:19:14 PM    Final    STUDIES:  EEG Echo on 12/22/2018 IMPRESSIONS    1. Mild hypokinesis of the left ventricular, entire inferior wall.  2. Moderate hypokinesis of the left ventricular, entire anterior wall.  3. Severe akinesis of the left ventricular, entire apical segment.  4. Severe akinesis of the left ventricular, mid-apical anteroseptal wall.  5. Moderate, fixed thrombus on the apical wall of the left ventricle.  6. The left ventricle has moderately reduced systolic function, with an  ejection fraction of 35-40%. The cavity size was normal. Left ventricular  diastolic function could not be evaluated.  7. The right ventricle has normal systolic function. The cavity was  normal. There is no increase in right ventricular wall thickness. Right  ventricular systolic pressure could not be assessed.  8. Left atrial size was not assessed.  9. The pericardium was not assessed.  10. The aortic valve is  grossly normal.  11. When compared to the prior study: Compared to prior reports in Care  Everywhere, LV clot is new. No images for direct comparison.  12. The interatrial septum was not assessed.   CULTURES: None  ANTIBIOTICS: none  LINES/TUBES: PIVS Foley  DISCUSSION: 69 y/o male presenting with new onset seizures likely due to CVA  ASSESSMENT / PLAN:  PULMONARY A: Acute hypoxic respiratory failure sue to seizure -resolved - patient is off mechanical ventilator.  CARDIOVASCULAR A:  H/o HTN and hyperlipidemia P:  Continue home medications  RENAL A:   hypokalemia P:   Monitor and replace electrolytes  INFECTIOUS A:   Leukocytosis without fever P:   Monitor fever curve PCT normal  NEUROLOGIC A:   New onset seizure Previous CVA with residual right sided weakness and speech difficulty F/U  WITH neurology Keppra 500mg  IV Q12 EEG  Best Practice: Code Status:Full code Diet: npo GI prophylaxis: ppi VTE prophylaxis:  All anticoagulants on hold/ SCDs  FAMILY  - Updates: Will be updated after interdisciplinary rounds  Critical care provider statement:    Critical care time (minutes):  33   Critical care time was exclusive of:  Separately billable procedures and  treating other patients   Critical care was necessary to treat or prevent imminent or  life-threatening deterioration of the following conditions:  generalized seizure , acute hypoxemic respiratory failure possibly due to aspiration pna   Critical care was time spent personally by me on the following  activities:  Development of treatment plan with patient or surrogate,  discussions with consultants, evaluation of patient's response to  treatment, examination of patient, obtaining history from patient or  surrogate, ordering and performing treatments and interventions, ordering  and review of laboratory studies and re-evaluation of patient's condition   I assumed direction of critical care for this patient from another  provider in my specialty: no     Vida RiggerFuad Melanie Pellot, M.D.  Pulmonary & Critical Care Medicine  [facility]    05/17/2020, 11:59 AM

## 2020-05-17 NOTE — Progress Notes (Signed)
Pharmacy Electrolyte Monitoring Consult:  Pharmacy consulted to assist in monitoring and replacing electrolytes in this 69 y.o. male admitted on 05/15/2020 with Code Stroke, Altered Mental Status, and Seizures   Labs:  Sodium (mmol/L)  Date Value  05/17/2020 136  02/12/2014 135 (L)   Potassium (mmol/L)  Date Value  05/17/2020 3.3 (L)  02/12/2014 3.5   Magnesium (mg/dL)  Date Value  78/58/8502 1.8   Phosphorus (mg/dL)  Date Value  77/41/2878 4.1   Calcium (mg/dL)  Date Value  67/67/2094 8.1 (L)   Calcium, Total (mg/dL)  Date Value  70/96/2836 8.4 (L)   Albumin (g/dL)  Date Value  62/94/7654 4.1  02/12/2014 3.0 (L)    Assessment/Plan: Pt intubated for airway protection. Has new focal neurological findings.   Will replace potassium with KCL IV x 2 and KCL x 1 per tube. Replace Magnesium with Mag 2g IV x 1 dose.   F/u with AM labs.   Gardner Candle, PharmD, BCPS Clinical Pharmacist 05/17/2020 7:10 AM

## 2020-05-17 NOTE — TOC Initial Note (Addendum)
Transition of Care [facility]) - Initial/Assessment Note    Patient Details  Name: Corey Meyers MRN: [id number] Date of Birth: 1950/10/21  Transition of [facility]) CM/SW Contact:    Marina Goodell Phone Number: [phone] 05/17/2020, 9:15 AM  Clinical Narrative:                  Patient presents to [facility] ED due to AMS and seizure.  Patient extubates w/out complications this morning.  CSW spoke with patient's wife, Corey Meyers) [phone], patient has been good with ADLs, drives himself to doctor's appts and picks. Corey Meyers states she and the patient are married and live in separate houses.  Ms. Gitto stated the patient received home health from [facility] for speech therapy and he has improved until 48 hours ago.  Corey Meyers stated on Friday he was having a difficult time speaking and what he was saying was difficult to understand.  She also stated during the day "he would get a far of look in his eyes, and would take along time to respond to her". CSW explained to Corey Meyers the TOC process and time line.  This CSW gave Corey Meyers the medicare.gov information and encouraged her to contact [employer] to find out which home health agencies they work with in the area. This CSW informed Corey Meyers she can contact me with any questions or concerns. Corey Meyers verbalized understanding.    PASRR# [id number]  Expected Discharge Plan: Skilled Nursing Facility Barriers to Discharge: Continued Medical Work up   Patient Goals and CMS Choice Patient states their goals for this hospitalization and ongoing recovery are:: Patient currently intubated. CMS Medicare.gov Compare Post Acute Care list provided to:: Patient Represenative (must comment) Corey Meyers, Corey Meyers (Spouse) [phone]) Choice offered to / list presented to : Spouse  Expected Discharge Plan and Services Expected Discharge Plan: Skilled Nursing Facility In-house Referral: Clinical Social Work     Living arrangements for the past 2 months:  Single Family Home                                      Prior Living Arrangements/Services Living arrangements for the past 2 months: Single Family Home Lives with:: Spouse Patient language and need for interpreter reviewed:: Yes (Patient currently intubated, spoke with Corey Meyers, Corey Meyers (Spouse) [phone]) Do you feel safe going back to the place where you live?: Yes      Need for Family Participation in Patient Care: Yes (Comment) Care giver support system in place?: Yes (comment)   Criminal Activity/Legal Involvement Pertinent to Current Situation/Hospitalization: Yes - Comment as needed  Activities of Daily Living      Permission Sought/Granted Permission sought to share information with : Facility Medical sales representative    Share Information with NAME: Corey Meyers, Corey Meyers (Spouse) [phone]           Emotional Assessment Appearance:: Appears stated age Attitude/Demeanor/Rapport: Other (comment), Unable to Assess (Patietn currently intubated) Affect (typically observed): Unable to Assess, Other (comment) (Patient currently intubated)   Alcohol / Substance Use: Tobacco Use (Quit smoking 2 years ago) Psych Involvement: No (comment)  Admission diagnosis:  Seizure (HCC) [R56.9] Generalized seizure (HCC) [R56.9] Acute respiratory failure with hypercapnia (HCC) [J96.02] Stroke-like episode [R29.90] Patient Active Problem List   Diagnosis Date Noted  . Seizure (HCC) 05/15/2020  . 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 Essential hypertension 12/27/2018  .  Hyperlipidemia 12/27/2018  . NSTEMI (non-ST elevated myocardial infarction) (HCC) 12/27/2018  . Chronic systolic CHF (congestive heart failure) (HCC) 12/27/2018  . 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   PCP:  Medicine, [facility]:   [facility] [address] [place] - [address] [place] [address] Phone: [phone] Fax: [phone]  [facility] - [place], [place] - [address] Portal to Registered [facility] Sites [address] [place] [address] Phone: [phone] Fax: [phone]     Social Determinants of Health (SDOH) Interventions    Readmission Risk Interventions No flowsheet data found.

## 2020-05-17 NOTE — Progress Notes (Signed)
Extubated without complications to 2lnc 

## 2020-05-18 DIAGNOSIS — R569 Unspecified convulsions: Secondary | ICD-10-CM

## 2020-05-18 DIAGNOSIS — R2990 Unspecified symptoms and signs involving the nervous system: Secondary | ICD-10-CM

## 2020-05-18 LAB — BASIC METABOLIC PANEL
Anion gap: 6 (ref 5–15)
BUN: 10 mg/dL (ref 8–23)
CO2: 30 mmol/L (ref 22–32)
Calcium: 8.3 mg/dL — ABNORMAL LOW (ref 8.9–10.3)
Chloride: 104 mmol/L (ref 98–111)
Creatinine, Ser: 0.73 mg/dL (ref 0.61–1.24)
GFR calc Af Amer: >60 mL/min (ref >60)
GFR calc non Af Amer: >60 mL/min (ref >60)
Glucose, Bld: 88 mg/dL (ref 70–99)
Potassium: 3.7 mmol/L (ref 3.5–5.1)
Sodium: 140 mmol/L (ref 135–145)

## 2020-05-18 LAB — GLUCOSE, CAPILLARY
Glucose-Capillary: 97 mg/dL (ref 70–99)
Glucose-Capillary: 84 mg/dL (ref 70–99)
Glucose-Capillary: 70 mg/dL (ref 70–99)
Glucose-Capillary: 130 mg/dL — ABNORMAL HIGH (ref 70–99)
Glucose-Capillary: 129 mg/dL — ABNORMAL HIGH (ref 70–99)
Glucose-Capillary: 91 mg/dL (ref 70–99)

## 2020-05-18 LAB — PHOSPHORUS: Phosphorus: 2.2 mg/dL — ABNORMAL LOW (ref 2.5–4.6)

## 2020-05-18 LAB — MAGNESIUM: Magnesium: 1.9 mg/dL (ref 1.7–2.4)

## 2020-05-18 MED ORDER — HALOPERIDOL LACTATE 5 MG/ML IJ SOLN
2.0000 mg | Freq: Once | INTRAMUSCULAR | Status: AC
  Administered 2020-05-18: 2 mg via INTRAVENOUS

## 2020-05-18 MED ORDER — HALOPERIDOL LACTATE 5 MG/ML IJ SOLN
5.0000 mg | Freq: Once | INTRAMUSCULAR | Status: AC
  Administered 2020-05-18: 5 mg via INTRAVENOUS
  Filled 2020-05-18: qty 1

## 2020-05-18 MED ORDER — SODIUM CHLORIDE 0.9 % IV SOLN
1500.0000 mg | Freq: Once | INTRAVENOUS | Status: AC
  Administered 2020-05-18: 1500 mg via INTRAVENOUS
  Filled 2020-05-18: qty 30

## 2020-05-18 MED ORDER — POTASSIUM & SODIUM PHOSPHATES 280-160-250 MG PO PACK
2.0000 | PACK | Freq: Once | ORAL | Status: AC
  Administered 2020-05-18: 2
  Filled 2020-05-18: qty 2

## 2020-05-18 MED ORDER — LORAZEPAM 2 MG/ML IJ SOLN
1.0000 mg | Freq: Once | INTRAMUSCULAR | Status: AC
  Administered 2020-05-18: 1 mg via INTRAVENOUS

## 2020-05-18 MED ORDER — LORAZEPAM 2 MG/ML IJ SOLN
INTRAMUSCULAR | Status: AC
  Filled 2020-05-18: qty 1

## 2020-05-18 MED ORDER — HALOPERIDOL LACTATE 5 MG/ML IJ SOLN: 2.0000 mg | Freq: Once | INTRAMUSCULAR | Status: DC

## 2020-05-18 MED ORDER — LEVETIRACETAM IN NACL 1500 MG/100ML IV SOLN
1500.0000 mg | Freq: Two times a day (BID) | INTRAVENOUS | Status: DC
  Administered 2020-05-18 – 2020-05-21 (×6): 1500 mg via INTRAVENOUS
  Filled 2020-05-18 (×7): qty 100

## 2020-05-18 MED ORDER — PHENYTOIN SODIUM 50 MG/ML IJ SOLN
100.0000 mg | Freq: Three times a day (TID) | INTRAMUSCULAR | Status: DC
  Administered 2020-05-18 – 2020-05-19 (×4): 100 mg via INTRAVENOUS
  Filled 2020-05-18 (×5): qty 2

## 2020-05-18 MED ORDER — RIVAROXABAN 20 MG PO TABS
20.0000 mg | ORAL_TABLET | Freq: Every day | ORAL | Status: DC
  Administered 2020-05-19 – 2020-05-20 (×2): 20 mg via ORAL
  Filled 2020-05-18 (×4): qty 1

## 2020-05-18 MED ORDER — POTASSIUM CHLORIDE 20 MEQ/15ML (10%) PO SOLN
20.0000 meq | Freq: Once | ORAL | Status: AC
  Administered 2020-05-18: 20 meq
  Filled 2020-05-18: qty 15

## 2020-05-18 MED ORDER — ATORVASTATIN CALCIUM 20 MG PO TABS
80.0000 mg | ORAL_TABLET | Freq: Every day | ORAL | Status: DC
  Administered 2020-05-20 – 2020-05-21 (×2): 80 mg via ORAL
  Filled 2020-05-18 (×2): qty 4

## 2020-05-18 MED ORDER — LORAZEPAM 2 MG/ML IJ SOLN
2.0000 mg | INTRAMUSCULAR | Status: DC | PRN
  Administered 2020-05-18: 2 mg via INTRAVENOUS
  Filled 2020-05-18: qty 1

## 2020-05-18 MED ORDER — HALOPERIDOL LACTATE 5 MG/ML IJ SOLN
2.0000 mg | Freq: Four times a day (QID) | INTRAMUSCULAR | Status: DC | PRN
  Administered 2020-05-18 – 2020-05-20 (×2): 2 mg via INTRAVENOUS
  Filled 2020-05-18 (×3): qty 1

## 2020-05-18 NOTE — Consult Note (Signed)
Requesting Physician: Dr. Karna Christmas    Chief Complaint: Seizure  History obtained from: Patient and Chart    HPI:                                                                                                                                       Corey Meyers is a 69 y.o. 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  Description of seizure not available in chart review, however patient was postictal and acidotic and intubated for further airway management.  Patient was evaluated by tele-neurologist, loaded with 2 g of Keppra and started on 1 g twice daily maintenance dose.  UA negative for infection and UDS negative for illicit substances.  Patient extubated however remains aphasic.  MRI brain performed on 8/29 shows left mesial temporal lobe hippocampal signal abnormality suggestive of seizure focus.    On assessment, patient extubated, remains aphasic however Todd's paralysis has improved   Past Medical History:  Diagnosis Date  . Hyperlipidemia   . Hypertension   . Stroke [facility])     Past Surgical History:  Procedure Laterality Date  . IR CT HEAD LTD  12/21/2018  . IR PERCUTANEOUS ART THROMBECTOMY/INFUSION INTRACRANIAL INC DIAG ANGIO  12/21/2018  . LAMINECTOMY    . RADIOLOGY WITH ANESTHESIA N/A 12/21/2018   Procedure: RADIOLOGY WITH ANESTHESIA;  Surgeon: Julieanne Cotton, MD;  Location: [facility] OR;  Service: Radiology;  Laterality: N/A;  . TONSILLECTOMY      No family history on file. Social History:  reports that he has quit smoking. He has never used smokeless tobacco. He reports current alcohol use. No history on file for drug use.  Allergies: No Known  Allergies  Medications:                                                                                                                        I reviewed home medications   ROS:  Unable to obtain due to aphasia   Examination:                                                                                                      General: Appears well-developed and well-nourished.  Psych: Unable to assess Eyes: No scleral injection HENT: No OP obstrucion Head: Normocephalic.  Cardiovascular: Normal rate and regular rhythm.  Respiratory: Effort normal and breath sounds normal to anterior ascultation GI: Soft.  No distension. There is no tenderness.  Skin: WDI    Neurological Examination Mental Status: Alert, globally aphasic.  Not following commands Cranial Nerves: II: Visual fields : Blinks to threat bilaterally, less on the right side III,IV, VI: ptosis not present, extra-ocular motions intact bilaterally, pupils equal, round, reactive to light and accommodation V,VII: smile symmetric XII: midline tongue extension Motor: Right : Upper extremity   4/5    Left:     Upper extremity   4/5  Lower extremity   4/5     Lower extremity   4/5 Tone and bulk:normal tone throughout; no atrophy noted Sensory: Pinprick and light touch intact throughout, bilaterally Plantars: Right: downgoing   Left: downgoing Cerebellar: No gross ataxia   Lab Results: Basic Metabolic Panel: Recent Labs  Lab 05/15/20 1730 05/15/20 1730 05/15/20 2348 05/16/20 0442 05/17/20 0442 05/18/20 0458  NA 140  --   --  137 136 140  K 4.8  --   --  3.3* 3.3* 3.7  CL 102  --   --  105 101 104  CO2 15*  --   --  25 28 30   GLUCOSE 161*  --   --  89 103* 88  BUN 14  --   --  13 16 10   CREATININE 1.23  --  1.08 0.81 0.84 0.73  CALCIUM 9.3   < >  --  7.9* 8.1* 8.3*  MG  --   --  2.3  2.0 1.8 1.9  PHOS  --   --  2.1* 4.1  --  2.2*   < > = values in this interval not displayed.    CBC: Recent Labs  Lab 05/15/20 1730 05/15/20 2348 05/16/20 0442 05/17/20 1748  WBC 16.3* 13.7* 13.9* 11.8*  NEUTROABS 11.1*  --   --   --   HGB 15.6 14.5 13.3 13.1  HCT 47.8 42.7 39.1 38.3*  MCV 89.7 87.0 86.3 86.5  PLT 350 281 269 235    Coagulation Studies: Recent Labs    05/15/20 1730  LABPROT 20.1*  INR 1.8*    Imaging: MR BRAIN WO CONTRAST  Result Date: 05/17/2020 CLINICAL DATA:  Initial evaluation for acute seizure. EXAM: MRI HEAD WITHOUT CONTRAST TECHNIQUE: Multiplanar, multiecho pulse sequences of the brain and surrounding structures were obtained without intravenous contrast. COMPARISON:  Prior CTs from 05/15/2020. FINDINGS: Brain: Cerebral volume within normal limits for age. Encephalomalacia and gliosis involving the posterior left insula and left frontoparietal region compatible with chronic left MCA territory infarct. Associated lacunar type infarct present at the left caudate head. Scattered areas of associated chronic hemosiderin staining.  Increased T2/FLAIR signal intensity with diffusion abnormality seen involving the mesial left temporal lobe/left hippocampal formation (series 6, image 19), nonspecific, but presumably related to history of seizure. No associated susceptibility artifact or mass effect. No other evidence for acute or subacute infarct. Gray-white matter differentiation maintained elsewhere within the brain. No evidence for acute intracranial hemorrhage. No mass lesion or midline shift. Ex vacuo dilatation of the left lateral ventricle related to the chronic left MCA territory infarcts. No hydrocephalus. No extra-axial fluid collection. T1 hyperintensity noted about the sella/pituitary gland, nonspecific, but of doubtful significance in the acute setting. Gland itself is relatively normal in appearance. Vascular: Major intracranial vascular flow voids are  maintained. Skull and upper cervical spine: Craniocervical junction within normal limits. Bone marrow signal intensity normal. No scalp soft tissue abnormality. Sinuses/Orbits: Globes and orbital soft tissues within normal limits. Moderate mucosal thickening seen throughout the paranasal sinuses, greatest within the ethmoidal air cells. Fluid seen within the posterior nasopharynx. Bilateral mastoid effusions. Patient likely intubated. Other: None. IMPRESSION: 1. Increased signal abnormality involving the mesial left temporal lobe/left hippocampal formation, nonspecific, but presumably related to history of seizure. Correlation with EEG recommended. 2. No other acute intracranial abnormality. 3. Chronic left MCA territory infarcts. Electronically Signed   By: Rise Mu M.D.   On: 05/17/2020 00:22     I have reviewed the above imaging : MRI brain shows chronic left MCA stroke and left hippocampal signal abnormality   ASSESSMENT AND PLAN   69 year old male with chronic left MCA stroke, LV thrombus admitted after presenting status epilepticus.  Loaded with 2 g of Keppra and intubated for airway protection, subsequently extubated and remains aphasic.  20-minute routine EEG shows one brief subclinical seizure.   Nonconvulsive status epilepticus  Acute encephalopathy secondary to postictal state Worsening from baseline aphasia secondary to above  Recommendations We will load with Dilantin 1500 mg and start 100 3 times daily Increase Keppra to 1500 mg twice daily Repeat routine EEG tomorrow Continue frequent neurochecks Continue seizure precautions   CRITICAL CARE Performed by: Dara Lords Florence Antonelli   Total critical care time: 45 minutes  Critical care time was exclusive of separately billable procedures and treating other patients.  Critical care was necessary to treat or prevent imminent or life-threatening deterioration.  Critical care was time spent personally by me on the  following activities: development of treatment plan with patient and/or surrogate as well as nursing, discussions with consultants, evaluation of patient's response to treatment, examination of patient, obtaining history from patient or surrogate, ordering and performing treatments and interventions, ordering and review of laboratory studies, ordering and review of radiographic studies, pulse oximetry and re-evaluation of patient's condition.  Anokhi Shannon [facility] Pager Number [phone]

## 2020-05-18 NOTE — Progress Notes (Signed)
Nutrition Follow-up  DOCUMENTATION CODES:   Obesity unspecified  INTERVENTION:  Provide Ensure Max Protein po once daily, each supplement provides 150 kcal and 30 grams of protein.  NUTRITION DIAGNOSIS:   Inadequate oral intake related to inability to eat as evidenced by NPO status.  Resolving - diet now advanced.  GOAL:   Patient will meet greater than or equal to 90% of their needs  Progressing.  MONITOR:   PO intake, Supplement acceptance, Labs, Weight trends, I & O's  REASON FOR ASSESSMENT:   Consult, Ventilator Enteral/tube feeding initiation and management  ASSESSMENT:   69 year old male with history of HLD, HTN, and residual right sided weakness from previous CVA s/p thrombectomy (04/20) presented with new onset seizure.  8/29 extubated 8/30 diet advanced to dysphagia 3 with thin liquids following SLP evaluation  Met with patient at bedside after he had transferred down to 1A. Patient was confused and trying to get out of bed. NT present in room trying to re-orient patient. Noted per Neurology note today patient has worsening from baseline aphasia today. Patient unable to answer any questions but he did ask "what is that" repeatedly in regards to construction outside his room. Noted he had finished almost 100% of his lunch tray at bedside. Weight appears stable per review of chart.  Medications reviewed and include: Colace 100 mg BID, MVI daily, Miralax 17 grams daily, famotidine, Keppra, phenytoin.  Labs reviewed: CBG 70-130, Phosphorus 2.2.  NUTRITION - FOCUSED PHYSICAL EXAM:    Most Recent Value  Orbital Region No depletion  Upper Arm Region No depletion  Thoracic and Lumbar Region No depletion  Buccal Region No depletion  Temple Region No depletion  Clavicle Bone Region No depletion  Clavicle and Acromion Bone Region No depletion  Scapular Bone Region Unable to assess  Dorsal Hand No depletion  Patellar Region No depletion  Anterior Thigh Region No  depletion  Posterior Calf Region No depletion  Edema (RD Assessment) None  Hair Reviewed  Eyes Reviewed  Mouth Reviewed  Skin Reviewed  Nails Reviewed     Diet Order:   Diet Order            DIET DYS 3 Room service appropriate? Yes with Assist; Fluid consistency: Thin  Diet effective now                EDUCATION NEEDS:   Not appropriate for education at this time  Skin:  Skin Assessment: Reviewed RN Assessment  Last BM:  05/14/2020 per chart  Height:   Ht Readings from Last 1 Encounters:  05/17/20 6' 0.01" (1.829 m)   Weight:   Wt Readings from Last 1 Encounters:  05/18/20 113.2 kg   Ideal Body Weight:  80.9 kg  BMI:  Body mass index is 33.84 kg/m.  Estimated Nutritional Needs:   Kcal:  2300-2500  Protein:  115-125 grams  Fluid:  >/= 2.3 L/day   King, MS, RD, LDN Pager number available on Amion 

## 2020-05-18 NOTE — Progress Notes (Signed)
Pt hitting, kicking, scratching and trying to jump out of bed, RN paged security, security to room, RN paged MD, see new orders.

## 2020-05-18 NOTE — Procedures (Addendum)
Patient Name: Corey Meyers  MRN: [id number]  Epilepsy Attending: [name]  Referring Physician/Provider: Dr. Georgiana Spinner Aroor Date: 05/18/2020 Duration: 22.43 minutes  Patient history: 69 year old male presented with seizure-like episode.  EEG telemetry for seizures.  Level of alertness: Awake  AEDs during EEG study: Keppra  Technical aspects: This EEG study was done with scalp electrodes positioned according to the 10-20 International system of electrode placement. Electrical activity was acquired at a sampling rate of 500Hz  and reviewed with a high frequency filter of 70Hz  and a low frequency filter of 1Hz . EEG data were recorded continuously and digitally stored.   Description: The posterior dominant rhythm consists of 9 Hz activity of moderate voltage (25-35 uV) seen predominantly in posterior head regions, symmetric and reactive to eye opening and eye closing. Single seizure without clinical signs was noted at 1159 arising from left anterior frontal region, lasting about 30 seconds.  Concomitant EEG showed sharply contoured 6 Hz theta slowing in left anterior temporal region which gradually evolved into 2 to 3 Hz theta slowing and spread to involve left hemisphere as well as right temporal region.  After the seizure ended, EEG showed generalized background attenuation.  Physiologic photic driving was not seen during photic stimulation.  Hyperventilation as not performed.     ABNORMALITY -Seizure without clinical signs, left anterior temporal region  IMPRESSION: This study showed one seizure without clinical signs at 1159 arising from left anterior temporal region, lasting about 30 seconds.   Dr. Aroor was immediately notified.  Donley Harland 

## 2020-05-18 NOTE — Progress Notes (Signed)
Pt agitated and hitting at wife and staff. RN notified MD, see new order

## 2020-05-18 NOTE — Progress Notes (Signed)
eeg done °

## 2020-05-18 NOTE — Progress Notes (Signed)
Pt agitated and trying to get out of bed, pt cursing and hitting at wife. RN messaged MD and got PRN dose haldol. RN gave. After 10 mins, medication working, pt sleeping. Call bell within reach and bed alarm on

## 2020-05-18 NOTE — Progress Notes (Signed)
PULMONARY / CRITICAL CARE MEDICINE  Name: Corey Meyers MRN: [id number] DOB: 03/15/1951    LOS: 3  Admitting Provider:  Karna Christmas Reason for admission:  New onset seizure Brief patient description:  69 y/o male with a previous CVA presenting with new onset seizure.    SIGNIFICANT EVENTS: 05/15/2020: admitted  HPI: 69 y/o caucasian male with a medical history as indicated below  who presented to the ED with  AMS. History is obtained from ED records as patient is currently intubated and sedated. Patient developed sudden onset confusion and aphasia with right sided weakness. 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 While in the ambulance, patient had a witnessed seizure and was post-ictal upon arrival in the ED. Seizure lasted about 2.5 minutes. At the ED, he remained obtunded. Initial CT head stroke study was negative but patient could not get tPA because he is on eliquis and it had been approximately 5 hrs since symptoms started. Due to persistent somnolence and post-ictal confusion and acidosis on VBG, he was intubated. He is now admitted to the ICU for further management.    05/16/20- reviewed care plan with [name] Hunsberger (wife of patient), Weaned Fio2 down to 35 in preparation for SBT.  05/17/20- patient is liberated from mechanical ventilator.  8/30- patient optimized for TRH transfer and is moving to medical floor. Reviewed plan with neurologist and wife at bedside.    Past Medical History:  Diagnosis Date  . Hyperlipidemia   . Hypertension   . Stroke [facility])    Past Surgical History:  Procedure Laterality Date  . IR CT HEAD LTD  12/21/2018  . IR PERCUTANEOUS ART THROMBECTOMY/INFUSION INTRACRANIAL INC DIAG ANGIO  12/21/2018  . LAMINECTOMY    . RADIOLOGY WITH ANESTHESIA N/A 12/21/2018   Procedure: RADIOLOGY WITH ANESTHESIA;  Surgeon: Julieanne Cotton, MD;  Location: [facility] OR;  Service: Radiology;  Laterality: N/A;  . TONSILLECTOMY     No current facility-administered  medications on file prior to encounter.   Current Outpatient Medications on File Prior to Encounter  Medication Sig  . atorvastatin (LIPITOR) 80 MG tablet Take 1 tablet (80 mg total) by mouth daily at 6 PM.  . carvedilol (COREG) 12.5 MG tablet Take 12.5 mg by mouth 2 (two) times daily.  Carlena Hurl 20 MG TABS tablet Take 20 mg by mouth daily.  Marland Kitchen apixaban (ELIQUIS) 5 MG TABS tablet Take 1 tablet (5 mg total) by mouth 2 (two) times daily. (Patient not taking: Reported on 05/15/2020)  . feeding supplement, ENSURE ENLIVE, (ENSURE ENLIVE) LIQD Take 237 mLs by mouth daily at 3 pm. (Patient not taking: Reported on 05/15/2020)  . losartan (COZAAR) 25 MG tablet Take 1 tablet (25 mg total) by mouth daily. (Patient not taking: Reported on 05/15/2020)  . nitroGLYCERIN (NITROSTAT) 0.4 MG SL tablet Place 0.4 mg under the tongue every 5 (five) minutes as needed for chest pain.     Allergies No Known Allergies  Family History No family history on file. Social History  reports that he has quit smoking. He has never used smokeless tobacco. He reports current alcohol use. No history on file for drug use.  Review Of Systems:  Unable to obtain  VITAL SIGNS: BP 125/82 (BP Location: Left Arm)   Pulse 83   Temp 97.9 F (36.6 C) (Oral)   Resp 16   Ht 6' 0.01" (1.829 m)   Wt 113.2 kg   SpO2 93%   BMI 33.84 kg/m   HEMODYNAMICS:    VENTILATOR  SETTINGS:    INTAKE / OUTPUT: I/O last 3 completed shifts: In: 706.7 [I.V.:356.4; IV Piggyback:350.3] Out: 2375 [Urine:2375]  PHYSICAL EXAMINATION: General:  Obese, in NAD HEENT: PERRLA, ETT in place Neuro:  Sedated, GCS4T Cardiovascular:  RRR, S1/S2. No MRG, +2 pulses Lungs: CTAB Abdomen:  OBESE, normal bowel sounds, no organomegally Musculoskeletal:  Patient sedated Skin:  Warm, dry and intact  LABS:  BMET Recent Labs  Lab 05/16/20 0442 05/17/20 0442 05/18/20 0458  NA 137 136 140  K 3.3* 3.3* 3.7  CL 105 101 104  CO2 25 28 30   BUN 13 16 10    CREATININE 0.81 0.84 0.73  GLUCOSE 89 103* 88    Electrolytes Recent Labs  Lab 05/15/20 1730 05/15/20 2348 05/16/20 0442 05/17/20 0442 05/18/20 0458  CALCIUM   < >  --  7.9* 8.1* 8.3*  MG   < > 2.3 2.0 1.8 1.9  PHOS  --  2.1* 4.1  --  2.2*   < > = values in this interval not displayed.    CBC Recent Labs  Lab 05/15/20 2348 05/16/20 0442 05/17/20 1748  WBC 13.7* 13.9* 11.8*  HGB 14.5 13.3 13.1  HCT 42.7 39.1 38.3*  PLT 281 269 235    Coag's Recent Labs  Lab 05/15/20 1730  APTT 34  INR 1.8*    Sepsis Markers Recent Labs  Lab 05/15/20 2348 05/16/20 0442 05/16/20 1019 05/16/20 1300 05/17/20 0442  LATICACIDVEN  --   --  2.4* 2.5*  --   PROCALCITON <0.10 <0.10  --   --  <0.10    ABG Recent Labs  Lab 05/15/20 2003 05/15/20 2200 05/16/20 0500  PHART 7.13* 7.38 7.37  PCO2ART 86* 45 44  PO2ART 43* 82* 110*    Liver Enzymes Recent Labs  Lab 05/15/20 1730  AST 36  ALT 17  ALKPHOS 82  BILITOT 0.7  ALBUMIN 4.1    Cardiac Enzymes No results for input(s): TROPONINI, PROBNP in the last 168 hours.  Glucose Recent Labs  Lab 05/17/20 1601 05/17/20 1919 05/17/20 2350 05/18/20 0437 05/18/20 0754 05/18/20 1121  GLUCAP 103* 93 80 84 70 130*    Imaging EEG  Result Date: 05/18/2020 05/20/20, MD     05/18/2020  1:02 PM Patient Name: Corey Meyers MRN: 05/20/2020 Epilepsy Attending: Despina Pole Referring Physician/Provider: Dr. [id number] Aroor Date: 05/18/2020 Duration: 22.43 minutes Patient history: 69 year old male presented with seizure-like episode.  EEG telemetry for seizures. Level of alertness: Awake AEDs during EEG study: Keppra Technical aspects: This EEG study was done with scalp electrodes positioned according to the 10-20 International system of electrode placement. Electrical activity was acquired at a sampling rate of 500Hz  and reviewed with a high frequency filter of 70Hz  and a low frequency filter of 1Hz . EEG data were recorded  continuously and digitally stored. Description: The posterior dominant rhythm consists of 9 Hz activity of moderate voltage (25-35 uV) seen predominantly in posterior head regions, symmetric and reactive to eye opening and eye closing. Single seizure without clinical signs was noted at 1159 arising from left anterior frontal region, lasting about 30 seconds.  Concomitant EEG showed sharply contoured 6 Hz theta slowing in left anterior temporal region which gradually evolved into 2 to 3 Hz theta slowing and spread to involve left hemisphere as well as right temporal region.  After the seizure ended, EEG showed generalized background attenuation.  Physiologic photic driving was not seen during photic stimulation.  Hyperventilation as not performed.  ABNORMALITY -Seizure without clinical signs, left anterior temporal region IMPRESSION: This study showed one seizure without clinical signs at 1159 arising from left anterior temporal region, lasting about 30 seconds. Dr. Georgiana Spinner Aroor was immediately notified. Priyanka Annabelle Harman   STUDIES:  EEG Echo on 12/22/2018 IMPRESSIONS    1. Mild hypokinesis of the left ventricular, entire inferior wall.  2. Moderate hypokinesis of the left ventricular, entire anterior wall.  3. Severe akinesis of the left ventricular, entire apical segment.  4. Severe akinesis of the left ventricular, mid-apical anteroseptal wall.  5. Moderate, fixed thrombus on the apical wall of the left ventricle.  6. The left ventricle has moderately reduced systolic function, with an  ejection fraction of 35-40%. The cavity size was normal. Left ventricular  diastolic function could not be evaluated.  7. The right ventricle has normal systolic function. The cavity was  normal. There is no increase in right ventricular wall thickness. Right  ventricular systolic pressure could not be assessed.  8. Left atrial size was not assessed.  9. The pericardium was not assessed.  10. The  aortic valve is grossly normal.  11. When compared to the prior study: Compared to prior reports in Care  Everywhere, LV clot is new. No images for direct comparison.  12. The interatrial septum was not assessed.   CULTURES: None  ANTIBIOTICS: none  LINES/TUBES: PIVS Foley  DISCUSSION: 69 y/o male presenting with new onset seizures likely due to CVA  ASSESSMENT / PLAN:  PULMONARY A: Acute hypoxic respiratory failure sue to seizure -resolved - patient is off mechanical ventilator.  CARDIOVASCULAR A:  H/o HTN and hyperlipidemia P:  Continue home medications  RENAL A:   hypokalemia P:   Monitor and replace electrolytes  INFECTIOUS A:   Leukocytosis without fever P:   Monitor fever curve PCT normal  NEUROLOGIC A:   New onset seizure Previous CVA with residual right sided weakness and speech difficulty F/U WITH neurology Keppra 500mg  IV Q12 EEG  Best Practice: Code Status:Full code Diet: npo GI prophylaxis: ppi VTE prophylaxis:  All anticoagulants on hold/ SCDs  FAMILY  - Updates: Will be updated after interdisciplinary rounds  Critical care provider statement:    Critical care time (minutes):  33   Critical care time was exclusive of:  Separately billable procedures and  treating other patients   Critical care was necessary to treat or prevent imminent or  life-threatening deterioration of the following conditions:  generalized seizure , acute hypoxemic respiratory failure possibly due to aspiration pna   Critical care was time spent personally by me on the following  activities:  Development of treatment plan with patient or surrogate,  discussions with consultants, evaluation of patient's response to  treatment, examination of patient, obtaining history from patient or  surrogate, ordering and performing treatments and interventions, ordering  and review of laboratory studies and re-evaluation of patient's condition   I assumed direction of  critical care for this patient from another  provider in my specialty: no     , M.D.  Pulmonary & Critical Care Medicine  [facility]    05/18/2020, 2:41 PM

## 2020-05-18 NOTE — Evaluation (Signed)
Clinical/Bedside Swallow Evaluation Patient Details  Name: Corey Meyers MRN: [id number] Date of Birth: [birth year]  Today's Date: 05/18/2020 Time: SLP Start Time (ACUTE ONLY): 0915 SLP Stop Time (ACUTE ONLY): 1015 SLP Time Calculation (min) (ACUTE ONLY): 60 min  Past Medical History:  Past Medical History:  Diagnosis Date  . Hyperlipidemia   . Hypertension   . Stroke [facility])    Past Surgical History:  Past Surgical History:  Procedure Laterality Date  . IR CT HEAD LTD  12/21/2018  . IR PERCUTANEOUS ART THROMBECTOMY/INFUSION INTRACRANIAL INC DIAG ANGIO  12/21/2018  . LAMINECTOMY    . RADIOLOGY WITH ANESTHESIA N/A 12/21/2018   Procedure: RADIOLOGY WITH ANESTHESIA;  Surgeon: Julieanne Cotton, MD;  Location: [facility] OR;  Service: Radiology;  Laterality: N/A;  . TONSILLECTOMY     HPI:  Pt is a 69 y.o. 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 History is obtained from ED records as patient is currently intubated and sedated. Patient developed sudden onset confusion and further Aphasia(from Baseline).  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 While in the ambulance, patient had a witnessed seizure and was post-ictal upon arrival in the ED. Seizure lasted about 2.5 minutes. At the ED, he remained obtunded. Pt was extubated on 05/17/2020, ~2 days post intubation.  Per Rehab noted in 12/2018, eval notes reported: "Pt presents with moderate to severe expressive aphasia characterized by impairments in receptive and expressive language with more notable difficulty with verbal expression". Unsure of pt's f/u Rehab post d/c from hospital then.    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 No Motor Speech deficits were noted then. Unsure of pt's f/u w/ Rehab post discharge from hospital at that time. Pt currently presents w/ both Receptive and Expressive Aphasia impacting his follow through w/ instructions, answering y/n questions, and carry-over of discussed/presented information at this eval. Pt required Mod-Max assist to follow commands; noted spontaneous speech intermittently w/ casual, short phrases somewhat appropriate to the task. Pt was able to READ short phrases and words SLP wrote when going over general aspiration precautions. With given support, positioning, and Supervision, pt appears to present w/ adequate oropharyngeal phase swallow w/ No gross oropharyngeal phase dysphagia noted. Pt consumed po trials w/ No consistent overt, clinical s/s of aspiration during po trials. Pt appears at reduced risk for aspiration when following general aspiration precautions.  During po trials, pt consumed all consistencies w/ no overt coughing, decline in vocal quality, or change in respiratory presentation during/post trials. O2 sats remained mid 90s. Noted intermittent, mild throat clearing x2 when drinking juice -- unsure if d/t the sugary aspect of the thin liquid. NO Straws were utilized -- only Cup Drinking w/ support and cues. Oral phase appeared grossly [facility] w/ timely bolus management, mastication, and control of bolus propulsion for A-P transfer for swallowing. Oral clearing achieved w/ all trial consistencies. OM Exam appeared [facility] w/ no unilateral weakness noted. Gag+. Speech Clear. Pt fed self w/ setup support. Recommend a Mech Soft consistency diet w/ Cut meats, moistened foods; Thin liquidsVIA CUP - No Straws. Recommend general aspiration precautions, Pills WHOLE in Puree for safer, easier swallowing. Education given on Pills in Puree; food consistencies and  easy to eat options; general aspiration precautions. ST services will monitor pt's diet  toleration of diet next 1-3 days. NSG agreed. SLP Visit Diagnosis: Dysphagia, oropharyngeal phase (R13.12)    Aspiration Risk  Mild aspiration risk;Risk for inadequate nutrition/hydration (reduced following general precautions)    Diet Recommendation     Medication Administration: Whole meds with puree (for safer swallowing)    Other  Recommendations Recommended Consults:  (Dietician f/u) Oral Care Recommendations: Oral care BID;Oral care before and after PO;Staff/trained caregiver to provide oral care Other Recommendations:  (n/a)   Follow up Recommendations None (for swallowing)      Frequency and Duration min 2x/week  1 week       Prognosis Prognosis for Safe Diet Advancement: Good Barriers to Reach Goals: Cognitive deficits;Language deficits;Severity of deficits;Time post onset      Swallow Study   General Date of Onset: 05/15/20 HPI: Pt is a 69 y.o. 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 History is obtained from ED records as patient is currently intubated and sedated. Patient developed sudden onset confusion and further Aphasia(from Baseline).  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 While in the ambulance, patient had a witnessed seizure and was post-ictal upon arrival in the ED. Seizure lasted about 2.5 minutes. At the ED, he remained obtunded. Pt was extubated on 05/17/2020, ~2 days post intubation.  Per Rehab noted in 12/2018, eval notes reported: "Pt presents with moderate to severe expressive aphasia characterized by impairments in receptive and expressive language with more notable difficulty with verbal expression". Unsure of pt's f/u Rehab post d/c from hospital then.  Type of Study: Bedside Swallow Evaluation Previous Swallow Assessment: 12/2018 Diet Prior to this Study: Regular;Thin liquids (per report) Temperature Spikes  Noted: No (wbc 11.8 declining) Respiratory Status: Nasal cannula (2L) History of Recent Intubation: Yes Length of Intubations (days): 2 days Date extubated: 05/17/20 Behavior/Cognition: Alert;Cooperative;Pleasant mood;Confused;Distractible;Requires cueing;Doesn't follow directions (Aphasia baseline) Oral Cavity Assessment: Dry Oral Care Completed by SLP: Yes Oral Cavity - Dentition: Adequate natural dentition;Missing dentition (few) Vision: Functional for self-feeding (supported) Self-Feeding Abilities: Able to feed self;Needs assist;Needs set up Patient Positioning: Upright in bed (needed complete support ) Baseline Vocal Quality: Normal Volitional Cough: Cognitively unable to elicit Volitional Swallow: Unable to elicit    Oral/Motor/Sensory Function Overall Oral Motor/Sensory Function: Within functional limits   Ice Chips Ice chips: Within functional limits Presentation: Spoon (fed; 4 trials)   Thin Liquid Thin Liquid: Impaired (slight, inconsistent) Presentation: Cup;Self Fed (~6 ozs total) Oral Phase Impairments:  (none) Oral Phase Functional Implications:  (none) Pharyngeal  Phase Impairments: Throat Clearing - Delayed (x2 inconsistent)    Nectar Thick Nectar Thick Liquid: Not tested   Honey Thick Honey Thick Liquid: Not tested   Puree Puree: Within functional limits Presentation: Spoon;Self Fed (~3 ozs)   Solid     Solid: Within functional limits (grossly) Presentation: Self Fed;Spoon (8 trials)       Jerilynn Som, MS, CCC-SLP Jaleil Renwick 05/18/2020,1:13 PM

## 2020-05-18 NOTE — Progress Notes (Addendum)
Late note 1205: Report called to [facility] RN on 1A. 1245 Patient moved to 1A via bed.

## 2020-05-18 NOTE — Progress Notes (Signed)
Pharmacy Electrolyte Monitoring Consult:  Pharmacy consulted to assist in monitoring and replacing electrolytes in this 69 y.o. male admitted on 05/15/2020 with Code Stroke, Altered Mental Status, and Seizures   Labs:  Sodium (mmol/L)  Date Value  05/18/2020 140  02/12/2014 135 (L)   Potassium (mmol/L)  Date Value  05/18/2020 3.7  02/12/2014 3.5   Magnesium (mg/dL)  Date Value  53/29/9242 1.9   Phosphorus (mg/dL)  Date Value  68/34/1962 2.2 (L)   Calcium (mg/dL)  Date Value  22/97/9892 8.3 (L)   Calcium, Total (mg/dL)  Date Value  11/94/1740 8.4 (L)   Albumin (g/dL)  Date Value  81/44/8185 4.1  02/12/2014 3.0 (L)    Assessment/Plan: 69 y/o male w/ PMH of HLD, HTN. 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  He is being transferred to 1A today for further care.   2 packets Phos-Nak x 1  20 mEq oral KCl x 1  Because this protocol was started as part of a CCU protocol and the patient is transferring out today, pharmacy will sign off for now  Lowella Bandy, PharmD, BCPS Clinical Pharmacist 05/18/2020 7:28 AM

## 2020-05-19 LAB — RENAL FUNCTION PANEL
Albumin: 3.5 g/dL (ref 3.5–5.0)
Anion gap: 9 (ref 5–15)
BUN: 8 mg/dL (ref 8–23)
CO2: 30 mmol/L (ref 22–32)
Calcium: 8.3 mg/dL — ABNORMAL LOW (ref 8.9–10.3)
Chloride: 103 mmol/L (ref 98–111)
Creatinine, Ser: 0.74 mg/dL (ref 0.61–1.24)
GFR calc Af Amer: >60 mL/min (ref >60)
GFR calc non Af Amer: >60 mL/min (ref >60)
Glucose, Bld: 100 mg/dL — ABNORMAL HIGH (ref 70–99)
Phosphorus: 3 mg/dL (ref 2.5–4.6)
Potassium: 4.1 mmol/L (ref 3.5–5.1)
Sodium: 142 mmol/L (ref 135–145)

## 2020-05-19 LAB — GLUCOSE, CAPILLARY
Glucose-Capillary: 100 mg/dL — ABNORMAL HIGH (ref 70–99)
Glucose-Capillary: 153 mg/dL — ABNORMAL HIGH (ref 70–99)
Glucose-Capillary: 89 mg/dL (ref 70–99)
Glucose-Capillary: 91 mg/dL (ref 70–99)
Glucose-Capillary: 92 mg/dL (ref 70–99)
Glucose-Capillary: 94 mg/dL (ref 70–99)

## 2020-05-19 LAB — MAGNESIUM: Magnesium: 1.9 mg/dL (ref 1.7–2.4)

## 2020-05-19 MED ORDER — DIVALPROEX SODIUM 500 MG PO DR TAB
500.0000 mg | DELAYED_RELEASE_TABLET | Freq: Two times a day (BID) | ORAL | Status: DC
  Filled 2020-05-19 (×2): qty 1

## 2020-05-19 MED ORDER — VALPROATE SODIUM 500 MG/5ML IV SOLN
1000.0000 mg | Freq: Once | INTRAVENOUS | Status: AC
  Administered 2020-05-19: 1000 mg via INTRAVENOUS
  Filled 2020-05-19: qty 10

## 2020-05-19 MED ORDER — VALPROIC ACID 250 MG/5ML PO SOLN
500.0000 mg | Freq: Two times a day (BID) | ORAL | Status: DC
  Administered 2020-05-19 – 2020-05-20 (×3): 500 mg via ORAL
  Filled 2020-05-19 (×5): qty 10

## 2020-05-19 MED ORDER — VALPROATE SODIUM 500 MG/5ML IV SOLN: 1500.0000 mg | Freq: Once | INTRAVENOUS | Status: DC

## 2020-05-19 NOTE — Care Management Important Message (Signed)
Important Message  Patient Details  Name: Corey Meyers MRN: [id number] Date of Birth: May 23, 1951   Medicare Important Message Given:  Yes  Called and reviewed with patient's wife.  Left copy in patient's room for reference.     Johnell Comings 05/19/2020, 11:21 AM

## 2020-05-19 NOTE — Progress Notes (Signed)
Prn haldol and ativan given this shift and has been effective. Sitter in room ordered for safety.

## 2020-05-19 NOTE — Progress Notes (Signed)
Wife at bedside. Pt resting. No agitation/irritation/combative behavior noted.

## 2020-05-19 NOTE — Procedures (Signed)
Patient Name: Corey Meyers  MRN: [id number]  Epilepsy Attending: [name]  Referring Physician/Provider: Dr Georgiana Spinner Aroor Date: 05/19/2020  Duration: 23.57 mins  Patient history: 69 year old male with chronic left MCA stroke, LV thrombus admitted after presenting status epilepticus. EEG to evaluate for seizure.   Level of alertness: asleep  AEDs during EEG study: LEV, PHT, VPA  Technical aspects: This EEG study was done with scalp electrodes positioned according to the 10-20 International system of electrode placement. Electrical activity was acquired at a sampling rate of 500Hz  and reviewed with a high frequency filter of 70Hz  and a low frequency filter of 1Hz . EEG data were recorded continuously and digitally stored.   Description:  Sleep was characterized by vertex waves, sleep spindles (12 to 14 Hz), maximal frontocentral region.  Single seizure without clinical signs was noted at 1512 arising from left anterior frontal region, lasting about 30 seconds.  Concomitant EEG showed sharply contoured 6 Hz theta slowing in left anterior temporal region which gradually evolved into 2 to 3 Hz theta slowing and spread to involve left hemisphere as well as right temporal region.  Hyperventilation and photic stimulation were not performed.     ABNORMALITY -Seizure without clinical signs, left anterior temporal region  IMPRESSION: This study showed one seizure without clinical signs at 1512 arising from left anterior temporal region, lasting about 30 seconds.   Dr. Aroor was notified.  Ainslee Sou 

## 2020-05-19 NOTE — Progress Notes (Addendum)
PROGRESS NOTE    Corey Meyers  NWG:[id number] DOB: 09-May-1951 DOA: 05/15/2020 PCP: Medicine, [facility]    Brief Narrative:  69 year old Caucasian male with past medical history significant for CVA with residual right-sided weakness presented in the emergency department with altered mental status and witnessed seizures. History is obtained from ED records.  Patient was intubated on arrival.   Patient was admitted in ICU.  Patient was not deemed a TPA candidate because symptoms were more than 5 hours and patient was on Eliquis.  Patient was seen by neurology  and was loaded with IV Keppra and IV Dilantin.  Successfully extubated yesterday.  PCCM pickup 8/31.  Neurology following recommended to continue Keppra and Dilantin. Patient was restless agitated given Ativan and Haldol.  Assessment & Plan:   Active Problems:   Seizure (HCC)  New onset seizures: Admitted with new onset seizures. Neurology consulted, recommended continue IV Keppra and IV Dilantin EEG: confirmed seizure Frequent neuro checks Continue seizure precautions. Plan for repeat EEG today.  Acute hypoxic respiratory failure due to seizures: Patient was intubated on arrival, successfully extubated 8/30. Patient is on room air saturating 94%. Acute hypoxia is resolved.  Hypertension:  Blood pressure is soft,  hold home medications Resume once blood pressure improves.  Hyperlipidemia : Continue statins  Hx. of Left MCA stroke, LV thrombus : Residual right sided weakness. On  Xarelto   DVT prophylaxis: xarelto Code Status: Full code Family Communication: No one at bedside. Disposition Plan: Anticipated discharge home once medically clear. Consultants:  Neurology   Procedures:  None.  Antimicrobials:  Anti-infectives (From admission, onward)   None      Subjective: Patient was seen and examined at bedside.  He is very lethargic in am because he was agitated and restless overnight,  coming out of the  bed was given Haldol and Ativan.  Objective: Vitals:   05/18/20 2114 05/19/20 0036 05/19/20 0212 05/19/20 0808  BP: (!) 135/94 110/86  114/67  Pulse: (!) 101 78 89 83  Resp: 16 16  20   Temp: 97.7 F (36.5 C) 98.2 F (36.8 C)  98.7 F (37.1 C)  TempSrc: Oral Oral  Oral  SpO2: 95% (!) 83% 93% 97%  Weight:      Height:        Intake/Output Summary (Last 24 hours) at 05/19/2020 1451 Last data filed at 05/19/2020 0940 Gross per 24 hour  Intake 0 ml  Output 350 ml  Net -350 ml   Filed Weights   05/15/20 2004 05/16/20 0150 05/18/20 0439  Weight: 110.7 kg 114.2 kg 113.2 kg    Examination:  General exam: Very sleepy, somnolent but arousable. Respiratory system: Clear to auscultation. Respiratory effort normal. Cardiovascular system: S1 & S2 heard, RRR. No JVD, murmurs, rubs, gallops or clicks. No pedal edema. Gastrointestinal system: Abdomen is nondistended, soft and nontender. No organomegaly or masses felt. Normal bowel sounds heard. Central nervous system: Alert and oriented. No focal neurological deficits. Extremities: No edema, no cyanosis. Skin: No rashes, lesions or ulcers Psychiatry: Judgement and insight appear normal. Mood & affect appropriate.     Data Reviewed: I have personally reviewed following labs and imaging studies  CBC: Recent Labs  Lab 05/15/20 1730 05/15/20 2348 05/16/20 0442 05/17/20 1748  WBC 16.3* 13.7* 13.9* 11.8*  NEUTROABS 11.1*  --   --   --   HGB 15.6 14.5 13.3 13.1  HCT 47.8 42.7 39.1 38.3*  MCV 89.7 87.0 86.3 86.5  PLT 350 281 269 235  Basic Metabolic Panel: Recent Labs  Lab 05/15/20 1730 05/15/20 1730 05/15/20 2348 05/16/20 0442 05/17/20 0442 05/18/20 0458 05/19/20 0209  NA 140  --   --  137 136 140 142  K 4.8  --   --  3.3* 3.3* 3.7 4.1  CL 102  --   --  105 101 104 103  CO2 15*  --   --  25 28 30 30   GLUCOSE 161*  --   --  89 103* 88 100*  BUN 14  --   --  13 16 10 8   CREATININE 1.23   < > 1.08 0.81 0.84 0.73 0.74    CALCIUM 9.3  --   --  7.9* 8.1* 8.3* 8.3*  MG  --   --  2.3 2.0 1.8 1.9 1.9  PHOS  --   --  2.1* 4.1  --  2.2* 3.0   < > = values in this interval not displayed.   GFR: Estimated Creatinine Clearance: 114.8 mL/min (by C-G formula based on SCr of 0.74 mg/dL). Liver Function Tests: Recent Labs  Lab 05/15/20 1730 05/19/20 0209  AST 36  --   ALT 17  --   ALKPHOS 82  --   BILITOT 0.7  --   PROT 8.2*  --   ALBUMIN 4.1 3.5   No results for input(s): LIPASE, AMYLASE in the last 168 hours. No results for input(s): AMMONIA in the last 168 hours. Coagulation Profile: Recent Labs  Lab 05/15/20 1730  INR 1.8*   Cardiac Enzymes: No results for input(s): CKTOTAL, CKMB, CKMBINDEX, TROPONINI in the last 168 hours. BNP (last 3 results) No results for input(s): PROBNP in the last 8760 hours. HbA1C: No results for input(s): HGBA1C in the last 72 hours. CBG: Recent Labs  Lab 05/18/20 2112 05/19/20 0034 05/19/20 0443 05/19/20 0810 05/19/20 1150  GLUCAP 91 89 92 94 91   Lipid Profile: No results for input(s): CHOL, HDL, LDLCALC, TRIG, CHOLHDL, LDLDIRECT in the last 72 hours. Thyroid Function Tests: No results for input(s): TSH, T4TOTAL, FREET4, T3FREE, THYROIDAB in the last 72 hours. Anemia Panel: No results for input(s): VITAMINB12, FOLATE, FERRITIN, TIBC, IRON, RETICCTPCT in the last 72 hours. Sepsis Labs: Recent Labs  Lab 05/15/20 2348 05/16/20 0442 05/16/20 1019 05/16/20 1300 05/17/20 0442  PROCALCITON <0.10 <0.10  --   --  <0.10  LATICACIDVEN  --   --  2.4* 2.5*  --     Recent Results (from the past 240 hour(s))  SARS Coronavirus 2 by RT PCR (hospital order, performed in [facility] hospital lab) Nasopharyngeal Nasopharyngeal Swab     Status: None   Collection Time: 05/15/20  5:30 PM   Specimen: Nasopharyngeal Swab  Result Value Ref Range Status   SARS Coronavirus 2 NEGATIVE NEGATIVE Final    Comment: (NOTE) SARS-CoV-2 target nucleic acids are NOT DETECTED.  The  SARS-CoV-2 RNA is generally detectable in upper and lower respiratory specimens during the acute phase of infection. The lowest concentration of SARS-CoV-2 viral copies this assay can detect is 250 copies / mL. A negative result does not preclude SARS-CoV-2 infection and should not be used as the sole basis for treatment or other patient management decisions.  A negative result may occur with improper specimen collection / handling, submission of specimen other than nasopharyngeal swab, presence of viral mutation(s) within the areas targeted by this assay, and inadequate number of viral copies (<250 copies / mL). A negative result must be combined with clinical observations,  patient history, and epidemiological information.  Fact Sheet for Patients:   BoilerBrush.com.cy  Fact Sheet for Healthcare Providers: https://pope.com/  This test is not yet approved or  cleared by the [place] FDA and has been authorized for detection and/or diagnosis of SARS-CoV-2 by FDA under an Emergency Use Authorization (EUA).  This EUA will remain in effect (meaning this test can be used) for the duration of the COVID-19 declaration under Section 564(b)(1) of the Act, 21 U.S.C. section 360bbb-3(b)(1), unless the authorization is terminated or revoked sooner.  Performed at [facility], [address] [place], [place] [address]   MRSA PCR Screening     Status: None   Collection Time: 05/16/20  2:25 AM   Specimen: Nasopharyngeal  Result Value Ref Range Status   MRSA by PCR NEGATIVE NEGATIVE Final    Comment:        The GeneXpert MRSA Assay (FDA approved for NASAL specimens only), is one component of a comprehensive MRSA colonization surveillance program. It is not intended to diagnose MRSA infection nor to guide or monitor treatment for MRSA infections. Performed at [facility], [address], [place], [place] [address]        Radiology Studies: EEG  Result Date: 05/18/2020 [name], MD     05/18/2020  1:02 PM Patient Name: AUL MANGIERI MRN: [id number] Epilepsy Attending: [name] Referring Physician/Provider: Dr. Georgiana Spinner Aroor Date: 05/18/2020 Duration: 22.43 minutes Patient history: 69 year old male presented with seizure-like episode.  EEG telemetry for seizures. Level of alertness: Awake AEDs during EEG study: Keppra Technical aspects: This EEG study was done with scalp electrodes positioned according to the 10-20 International system of electrode placement. Electrical activity was acquired at a sampling rate of 500Hz  and reviewed with a high frequency filter of 70Hz  and a low frequency filter of 1Hz . EEG data were recorded continuously and digitally stored. Description: The posterior dominant rhythm consists of 9 Hz activity of moderate voltage (25-35 uV) seen predominantly in posterior head regions, symmetric and reactive to eye opening and eye closing. Single seizure without clinical signs was noted at 1159 arising from left anterior frontal region, lasting about 30 seconds.  Concomitant EEG showed sharply contoured 6 Hz theta slowing in left anterior temporal region which gradually evolved into 2 to 3 Hz theta slowing and spread to involve left hemisphere as well as right temporal region.  After the seizure ended, EEG showed generalized background attenuation.  Physiologic photic driving was not seen during photic stimulation.  Hyperventilation as not performed.   ABNORMALITY -Seizure without clinical signs, left anterior temporal region IMPRESSION: This study showed one seizure without clinical signs at 1159 arising from left anterior temporal region, lasting about 30 seconds. Dr. Aroor was immediately notified. Priyanka    Scheduled Meds: . atorvastatin  80 mg Oral Daily  . Chlorhexidine Gluconate Cloth  6 each Topical Daily  . docusate  100 mg Oral BID  . mouth rinse  15 mL Mouth  Rinse BID  . multivitamin with minerals  1 tablet Per Tube Daily  . phenytoin (DILANTIN) IV  100 mg Intravenous Q8H  . polyethylene glycol  17 g Oral Daily  . rivaroxaban  20 mg Oral Q supper   Continuous Infusions: . famotidine (PEPCID) IV 20 mg (05/19/20 1119)  . levETIRAcetam 1,500 mg (05/19/20 0505)     LOS: 4 days    Time spent: 25 mins    Annabelle Harman, MD [facility]  If 7PM-7AM, please contact night-coverage

## 2020-05-19 NOTE — Progress Notes (Signed)
Reason for consult: Seizures  Subjective: Somnolent this morning.  Received Haldol and Ativan yesterday as patient trying to jump up out of bed.   ROS: negative except above   Examination  Vital signs in last 24 hours: Temp:  [97.7 F (36.5 C)-98.7 F (37.1 C)] 98.7 F (37.1 C) (08/31 0808) Pulse Rate:  [78-101] 83 (08/31 0808) Resp:  [16-20] 20 (08/31 0808) BP: (110-135)/(67-94) 114/67 (08/31 0808) SpO2:  [83 %-97 %] 97 % (08/31 0808)  General: lying in bed CVS: pulse-normal rate and rhythm RS: breathing comfortably Extremities: normal   Neuro: MS: Somnolent but arousable, follows commands CN: pupils equal and reactive,  EOMI, face symmetric, tongue midline, normal sensation over face, Motor: Moves all 4 extremities well Plantars: flexor Coordination: normal Gait: not tested  Basic Metabolic Panel: Recent Labs  Lab 05/15/20 1730 05/15/20 1730 05/15/20 2348 05/16/20 0442 05/16/20 0442 05/17/20 0442 05/18/20 0458 05/19/20 0209  NA 140  --   --  137  --  136 140 142  K 4.8  --   --  3.3*  --  3.3* 3.7 4.1  CL 102  --   --  105  --  101 104 103  CO2 15*  --   --  25  --  28 30 30   GLUCOSE 161*  --   --  89  --  103* 88 100*  BUN 14  --   --  13  --  16 10 8   CREATININE 1.23   < > 1.08 0.81  --  0.84 0.73 0.74  CALCIUM 9.3   < >  --  7.9*   < > 8.1* 8.3* 8.3*  MG  --   --  2.3 2.0  --  1.8 1.9 1.9  PHOS  --   --  2.1* 4.1  --   --  2.2* 3.0   < > = values in this interval not displayed.    CBC: Recent Labs  Lab 05/15/20 1730 05/15/20 2348 05/16/20 0442 05/17/20 1748  WBC 16.3* 13.7* 13.9* 11.8*  NEUTROABS 11.1*  --   --   --   HGB 15.6 14.5 13.3 13.1  HCT 47.8 42.7 39.1 38.3*  MCV 89.7 87.0 86.3 86.5  PLT 350 281 269 235     Coagulation Studies: No results for input(s): LABPROT, INR in the last 72 hours.  Imaging Reviewed:   MRI brain  ASSESSMENT AND PLAN   69 year old male with chronic left MCA stroke, LV thrombus admitted after  presenting status epilepticus.  Loaded with 2 g of Keppra and intubated for airway protection, subsequently extubated and remains aphasic.  20-minute routine EEG shows one brief subclinical seizure.   Patient loaded with phenytoin and Keppra dose was increased.  Nonconvulsive status epilepticus  Acute encephalopathy secondary to postictal state Worsening from baseline aphasia secondary to above  Recommendations Continue 100mg   3 times daily Continue Keppra to 1500 mg twice daily Repeat routine EEG today  Continue frequent neurochecks Continue seizure precautions    05/19/20 Thaniel Coluccio [facility] Pager Number [phone] For questions after 7pm please refer to AMION to reach the Neurologist on call

## 2020-05-19 NOTE — Progress Notes (Signed)
eeg done °

## 2020-05-19 NOTE — Progress Notes (Signed)
Pt in deep sleep and is snoring. Pt too lethargic to give morning meds at this time. Obtained vitals. Vitals WNL. Will administer medications when pt is more alert and awake. Will continue to monitor.

## 2020-05-20 DIAGNOSIS — I10 Essential (primary) hypertension: Secondary | ICD-10-CM

## 2020-05-20 LAB — URINALYSIS, COMPLETE (UACMP) WITH MICROSCOPIC
Bilirubin Urine: NEGATIVE
Glucose, UA: NEGATIVE mg/dL
Ketones, ur: 5 mg/dL — AB
Nitrite: NEGATIVE
Protein, ur: >=300 mg/dL — AB
RBC / HPF: >50 RBC/hpf — ABNORMAL HIGH (ref 0–5)
Specific Gravity, Urine: 1.023 (ref 1.005–1.030)
pH: 6 (ref 5.0–8.0)

## 2020-05-20 LAB — GLUCOSE, CAPILLARY
Glucose-Capillary: 101 mg/dL — ABNORMAL HIGH (ref 70–99)
Glucose-Capillary: 96 mg/dL (ref 70–99)
Glucose-Capillary: 89 mg/dL (ref 70–99)
Glucose-Capillary: 138 mg/dL — ABNORMAL HIGH (ref 70–99)
Glucose-Capillary: 110 mg/dL — ABNORMAL HIGH (ref 70–99)

## 2020-05-20 LAB — BASIC METABOLIC PANEL
Anion gap: 9 (ref 5–15)
BUN: 11 mg/dL (ref 8–23)
CO2: 27 mmol/L (ref 22–32)
Calcium: 8.7 mg/dL — ABNORMAL LOW (ref 8.9–10.3)
Chloride: 104 mmol/L (ref 98–111)
Creatinine, Ser: 0.78 mg/dL (ref 0.61–1.24)
GFR calc Af Amer: >60 mL/min (ref >60)
GFR calc non Af Amer: >60 mL/min (ref >60)
Glucose, Bld: 88 mg/dL (ref 70–99)
Potassium: 3.6 mmol/L (ref 3.5–5.1)
Sodium: 140 mmol/L (ref 135–145)

## 2020-05-20 LAB — MAGNESIUM: Magnesium: 2 mg/dL (ref 1.7–2.4)

## 2020-05-20 LAB — CBC
HCT: 40.5 % (ref 39.0–52.0)
Hemoglobin: 13.8 g/dL (ref 13.0–17.0)
MCH: 29.6 pg (ref 26.0–34.0)
MCHC: 34.1 g/dL (ref 30.0–36.0)
MCV: 86.9 fL (ref 80.0–100.0)
Platelets: 275 10*3/uL (ref 150–400)
RBC: 4.66 MIL/uL (ref 4.22–5.81)
RDW: 13.3 % (ref 11.5–15.5)
WBC: 8.9 10*3/uL (ref 4.0–10.5)
nRBC: 0 % (ref 0.0–0.2)

## 2020-05-20 LAB — PHOSPHORUS: Phosphorus: 3.2 mg/dL (ref 2.5–4.6)

## 2020-05-20 NOTE — Progress Notes (Signed)
Reason for consult: Seizures  Subjective: Patient is awake, just had his breakfast by himself.  Is an oriented to month, place and year.  Still has some difficulty expressing words but able to have sensible conversation.   ROS: negative except above  Examination  Vital signs in last 24 hours: Temp:  [98.5 F (36.9 C)-99 F (37.2 C)] 98.6 F (37 C) (09/01 0804) Pulse Rate:  [69-91] 86 (09/01 0804) Resp:  [16-17] 17 (09/01 0804) BP: (107-125)/(65-80) 107/65 (09/01 0804) SpO2:  [95 %-97 %] 95 % (09/01 0804) Weight:  [116.8 kg] 116.8 kg (09/01 0543)  General: lying in bed CVS: pulse-normal rate and rhythm RS: breathing comfortably Extremities: normal   Neuro: MS: Alert, oriented, follows commands, mild aphasia mostly expressive CN: pupils equal and reactive,  EOMI, face symmetric, tongue midline, normal sensation over face, Motor: 5/5 strength in left upper and lower extremity, 4+ by 5 strength in right upper and lower extremity Reflexes: , plantars: flexor Coordination: normal Gait: not tested  Basic Metabolic Panel: Recent Labs  Lab 05/15/20 1730 05/15/20 2348 05/16/20 0442 05/16/20 0442 05/17/20 0442 05/17/20 0442 05/18/20 0458 05/19/20 0209 05/20/20 0527  NA   < >  --  137  --  136  --  140 142 140  K   < >  --  3.3*  --  3.3*  --  3.7 4.1 3.6  CL   < >  --  105  --  101  --  104 103 104  CO2   < >  --  25  --  28  --  30 30 27   GLUCOSE   < >  --  89  --  103*  --  88 100* 88  BUN   < >  --  13  --  16  --  10 8 11   CREATININE   < > 1.08 0.81  --  0.84  --  0.73 0.74 0.78  CALCIUM   < >  --  7.9*   < > 8.1*   < > 8.3* 8.3* 8.7*  MG   < > 2.3 2.0  --  1.8  --  1.9 1.9 2.0  PHOS  --  2.1* 4.1  --   --   --  2.2* 3.0 3.2   < > = values in this interval not displayed.    CBC: Recent Labs  Lab 05/15/20 1730 05/15/20 2348 05/16/20 0442 05/17/20 1748 05/20/20 0527  WBC 16.3* 13.7* 13.9* 11.8* 8.9  NEUTROABS 11.1*  --   --   --   --   HGB 15.6 14.5 13.3  13.1 13.8  HCT 47.8 42.7 39.1 38.3* 40.5  MCV 89.7 87.0 86.3 86.5 86.9  PLT 350 281 269 235 275     Coagulation Studies: No results for input(s): LABPROT, INR in the last 72 hours.  1 hour EEG   ABNORMALITY -Seizure without clinical signs, left anterior temporalregion -Sharp wave, left anterior temporal region  IMPRESSION: This studyshowedoneseizure without clinical signs at 1215arising from left anterior temporal region, lasting about 20 seconds.  Additionally, there is evidence of epileptogenicity arising from the left anterior temporal region.   ASSESSMENT AND PLAN   69 year old male with chronic left MCA stroke, LV thrombus admitted after presenting status epilepticus. Loaded with 2 g of Keppra and intubated for airway protection, subsequently extubated and remains aphasic. 20-minute routine EEG shows one brief subclinical seizure.  Patient loaded with phenytoin and Keppra dose was increased.  Patient was switched from phenytoin to valproic acid yesterday due to interaction of phenytoin on patient Xarelto.  Yesterday routine EEG again showed subclinical seizure-EEG repeated today after switching from Dilantin to valproic acid.  1 hour EEG showed 20 second brief subclinical seizure.   Clinically patient continues to improve and is conversational this morning.  Nonconvulsive status epilepticus/subclinical seizures Acute encephalopathy secondary to postictal state   Recommendations Will not add additional AED since patient clinically improving despite occasional brief subclinical seizures. Continue  Depakote 500 mg twice daily, check level tomorrow Continue Keppra to 1500 mg twice daily Continue frequent neurochecks Continue seizure precautions Continue PT OT  We will clinically monitor for 1 more day before planning for discharge  Asyria Kolander [facility] Pager Number [phone] For questions after 7pm please refer to AMION to reach the  Neurologist on call

## 2020-05-20 NOTE — Progress Notes (Signed)
Prolonged eeg done 

## 2020-05-20 NOTE — Procedures (Addendum)
Patient Name: Corey Meyers  MRN: [id number]  Epilepsy Attending: [name]  Referring Physician/Provider: Dr Georgiana Spinner Aroor Date:05/20/2020  Duration: 1 hour  Patient history: 69 year old male with chronic left MCA stroke, LV thrombus admitted after presenting status epilepticus. EEG to evaluate for seizure.   Level of alertness: awake, asleep  AEDs during EEG study: LEV, PHT, VPA  Technical aspects: This EEG study was done with scalp electrodes positioned according to the 10-20 International system of electrode placement. Electrical activity was acquired at a sampling rate of 500Hz  and reviewed with a high frequency filter of 70Hz  and a low frequency filter of 1Hz . EEG data were recorded continuously and digitally stored.   Description:  The posterior dominant rhythm consists of 9 Hz activity of moderate voltage (25-35 uV) seen predominantly in posterior head regions, symmetric and reactive to eye opening and eye closing. Sleep was characterized by vertex waves, sleep spindles (12 to 14 Hz), maximal frontocentral region.  Single seizure without clinical signs was noted at 1215 arising from left anterior frontal region, lasting about 20 seconds. Concomitant EEG showed sharply contoured 6 Hz theta slowing in left anterior temporal region which gradually evolved into 2 to 3 Hz theta slowing and spread to involve left hemisphere as well as right temporal region.    Sharp waves were also seen in left antecubital region.  Essentially 40 driving was not seen during photic stimulation.  Hyperventilation was not performed.  ABNORMALITY -Seizure without clinical signs, left anterior temporalregion -Sharp wave, left anterior temporal region  IMPRESSION: This studyshowedoneseizure without clinical signs at 1215 arising from left anterior temporal region, lasting about 20 seconds.  Additionally, there is evidence of epileptogenicity arising from the left anterior temporal  region.  Dr. Aroor was notified.  [name] 

## 2020-05-20 NOTE — Progress Notes (Signed)
PROGRESS NOTE    Corey Meyers  ZOX:[id number] DOB: 1950/11/07 DOA: 05/15/2020 PCP: [facility]    Brief Narrative:  69 year old Caucasian male with past medical history significant for CVA with residual right-sided weakness presented in the emergency department with altered mental status and witnessed seizures. History is obtained from ED records.  Patient was intubated on arrival.   Patient was admitted in ICU.  Patient was not deemed a TPA candidate because symptoms were more than 5 hours and patient was on Eliquis.  Patient was seen by neurology  and was loaded with IV Keppra and IV Dilantin.  Successfully extubated yesterday.  PCCM pickup 8/31.  Neurology following recommended to continue Keppra and Dilantin. Patient was restless agitated given Ativan and Haldol.    Consultants:   Neurology  Procedures: EEG  Antimicrobials:       Subjective: Patient has no complaints.  Denies shortness of breath or chest pain.  Objective: Vitals:   05/19/20 1723 05/20/20 0100 05/20/20 0543 05/20/20 0804  BP: 125/80 107/65  107/65  Pulse: 91 69  86  Resp: 16 17  17   Temp: 98.5 F (36.9 C) 99 F (37.2 C)  98.6 F (37 C)  TempSrc: Oral Oral  Oral  SpO2: 97% 97%  95%  Weight:   116.8 kg   Height:        Intake/Output Summary (Last 24 hours) at 05/20/2020 0921 Last data filed at 05/20/2020 0541 Gross per 24 hour  Intake 1865 ml  Output 1100 ml  Net 765 ml   Filed Weights   05/16/20 0150 05/18/20 0439 05/20/20 0543  Weight: 114.2 kg 113.2 kg 116.8 kg    Examination:  General exam: Appears calm and comfortable  Respiratory system: Clear to auscultation. Respiratory effort normal. Cardiovascular system: S1 & S2 heard, RRR. No JVD, murmurs, rubs, gallops or clicks.  Gastrointestinal system: Abdomen is nondistended, soft and nontender. Normal bowel sounds heard. Central nervous system: Grossly intact.  For me he is awake but not oriented x3 Extremities: No  edema Skin: Warm dry Psychiatry:  Mood & affect appropriate in current setting.     Data Reviewed: I have personally reviewed following labs and imaging studies  CBC: Recent Labs  Lab 05/15/20 1730 05/15/20 2348 05/16/20 0442 05/17/20 1748 05/20/20 0527  WBC 16.3* 13.7* 13.9* 11.8* 8.9  NEUTROABS 11.1*  --   --   --   --   HGB 15.6 14.5 13.3 13.1 13.8  HCT 47.8 42.7 39.1 38.3* 40.5  MCV 89.7 87.0 86.3 86.5 86.9  PLT 350 281 269 235 275   Basic Metabolic Panel: Recent Labs  Lab 05/15/20 1730 05/15/20 2348 05/16/20 0442 05/17/20 0442 05/18/20 0458 05/19/20 0209 05/20/20 0527  NA   < >  --  137 136 140 142 140  K   < >  --  3.3* 3.3* 3.7 4.1 3.6  CL   < >  --  105 101 104 103 104  CO2   < >  --  25 28 30 30 27   GLUCOSE   < >  --  89 103* 88 100* 88  BUN   < >  --  13 16 10 8 11   CREATININE   < > 1.08 0.81 0.84 0.73 0.74 0.78  CALCIUM   < >  --  7.9* 8.1* 8.3* 8.3* 8.7*  MG   < > 2.3 2.0 1.8 1.9 1.9 2.0  PHOS  --  2.1* 4.1  --  2.2*  3.0 3.2   < > = values in this interval not displayed.   GFR: Estimated Creatinine Clearance: 116.6 mL/min (by C-G formula based on SCr of 0.78 mg/dL). Liver Function Tests: Recent Labs  Lab 05/15/20 1730 05/19/20 0209  AST 36  --   ALT 17  --   ALKPHOS 82  --   BILITOT 0.7  --   PROT 8.2*  --   ALBUMIN 4.1 3.5   No results for input(s): LIPASE, AMYLASE in the last 168 hours. No results for input(s): AMMONIA in the last 168 hours. Coagulation Profile: Recent Labs  Lab 05/15/20 1730  INR 1.8*   Cardiac Enzymes: No results for input(s): CKTOTAL, CKMB, CKMBINDEX, TROPONINI in the last 168 hours. BNP (last 3 results) No results for input(s): PROBNP in the last 8760 hours. HbA1C: No results for input(s): HGBA1C in the last 72 hours. CBG: Recent Labs  Lab 05/19/20 1726 05/19/20 2144 05/20/20 0110 05/20/20 0411 05/20/20 0806  GLUCAP 153* 100* 101* 96 89   Lipid Profile: No results for input(s): CHOL, HDL, LDLCALC,  TRIG, CHOLHDL, LDLDIRECT in the last 72 hours. Thyroid Function Tests: No results for input(s): TSH, T4TOTAL, FREET4, T3FREE, THYROIDAB in the last 72 hours. Anemia Panel: No results for input(s): VITAMINB12, FOLATE, FERRITIN, TIBC, IRON, RETICCTPCT in the last 72 hours. Sepsis Labs: Recent Labs  Lab 05/15/20 2348 05/16/20 0442 05/16/20 1019 05/16/20 1300 05/17/20 0442  PROCALCITON <0.10 <0.10  --   --  <0.10  LATICACIDVEN  --   --  2.4* 2.5*  --     Recent Results (from the past 240 hour(s))  SARS Coronavirus 2 by RT PCR (hospital order, performed in [facility] hospital lab) Nasopharyngeal Nasopharyngeal Swab     Status: None   Collection Time: 05/15/20  5:30 PM   Specimen: Nasopharyngeal Swab  Result Value Ref Range Status   SARS Coronavirus 2 NEGATIVE NEGATIVE Final    Comment: (NOTE) SARS-CoV-2 target nucleic acids are NOT DETECTED.  The SARS-CoV-2 RNA is generally detectable in upper and lower respiratory specimens during the acute phase of infection. The lowest concentration of SARS-CoV-2 viral copies this assay can detect is 250 copies / mL. A negative result does not preclude SARS-CoV-2 infection and should not be used as the sole basis for treatment or other patient management decisions.  A negative result may occur with improper specimen collection / handling, submission of specimen other than nasopharyngeal swab, presence of viral mutation(s) within the areas targeted by this assay, and inadequate number of viral copies (<250 copies / mL). A negative result must be combined with clinical observations, patient history, and epidemiological information.  Fact Sheet for Patients:   BoilerBrush.com.cy  Fact Sheet for Healthcare Providers: https://pope.com/  This test is not yet approved or  cleared by the [place] FDA and has been authorized for detection and/or diagnosis of SARS-CoV-2 by FDA under an Emergency  Use Authorization (EUA).  This EUA will remain in effect (meaning this test can be used) for the duration of the COVID-19 declaration under Section 564(b)(1) of the Act, 21 U.S.C. section 360bbb-3(b)(1), unless the authorization is terminated or revoked sooner.  Performed at [facility], [address] [place], [place] [address]   MRSA PCR Screening     Status: None   Collection Time: 05/16/20  2:25 AM   Specimen: Nasopharyngeal  Result Value Ref Range Status   MRSA by PCR NEGATIVE NEGATIVE Final    Comment:  The GeneXpert MRSA Assay (FDA approved for NASAL specimens only), is one component of a comprehensive MRSA colonization surveillance program. It is not intended to diagnose MRSA infection nor to guide or monitor treatment for MRSA infections. Performed at [facility], [address] [place], [place] [address]          Radiology Studies: EEG  Result Date: 05/19/2020 [name], MD     05/19/2020  4:19 PM Patient Name: Corey Meyers MRN: [id number] Epilepsy Attending: [name] Referring Physician/Provider: Dr Georgiana Spinner Aroor Date: 05/19/2020 Duration: 23.57 mins Patient history: 69 year old male with chronic left MCA stroke, LV thrombus admitted after presenting status epilepticus. EEG to evaluate for seizure. Level of alertness: asleep AEDs during EEG study: LEV, PHT, VPA Technical aspects: This EEG study was done with scalp electrodes positioned according to the 10-20 International system of electrode placement. Electrical activity was acquired at a sampling rate of 500Hz  and reviewed with a high frequency filter of 70Hz  and a low frequency filter of 1Hz . EEG data were recorded continuously and digitally stored. Description:  Sleep was characterized by vertex waves, sleep spindles (12 to 14 Hz), maximal frontocentral region.  Single seizure without clinical signs was noted at 1512 arising from left anterior frontal region, lasting about  30 seconds.  Concomitant EEG showed sharply contoured 6 Hz theta slowing in left anterior temporal region which gradually evolved into 2 to 3 Hz theta slowing and spread to involve left hemisphere as well as right temporal region.  Hyperventilation and photic stimulation were not performed.   ABNORMALITY -Seizure without clinical signs, left anterior temporal region  IMPRESSION: This study showed one seizure without clinical signs at 1512 arising from left anterior temporal region, lasting about 30 seconds.  Dr. Aroor was notified.   EEG  Result Date: 05/18/2020 Georgiana Spinner, MD     05/18/2020  1:02 PM Patient Name: Corey Meyers MRN: [name] Epilepsy Attending: 05/20/2020 Referring Physician/Provider: Dr. Despina Pole Aroor Date: 05/18/2020 Duration: 22.43 minutes Patient history: 69 year old male presented with seizure-like episode.  EEG telemetry for seizures. Level of alertness: Awake AEDs during EEG study: Keppra Technical aspects: This EEG study was done with scalp electrodes positioned according to the 10-20 International system of electrode placement. Electrical activity was acquired at a sampling rate of 500Hz  and reviewed with a high frequency filter of 70Hz  and a low frequency filter of 1Hz . EEG data were recorded continuously and digitally stored. Description: The posterior dominant rhythm consists of 9 Hz activity of moderate voltage (25-35 uV) seen predominantly in posterior head regions, symmetric and reactive to eye opening and eye closing. Single seizure without clinical signs was noted at 1159 arising from left anterior frontal region, lasting about 30 seconds.  Concomitant EEG showed sharply contoured 6 Hz theta slowing in left anterior temporal region which gradually evolved into 2 to 3 Hz theta slowing and spread to involve left hemisphere as well as right temporal region.  After the seizure ended, EEG showed generalized background attenuation.  Physiologic  photic driving was not seen during photic stimulation.  Hyperventilation as not performed.   ABNORMALITY -Seizure without clinical signs, left anterior temporal region IMPRESSION: This study showed one seizure without clinical signs at 1159 arising from left anterior temporal region, lasting about 30 seconds. Dr. Georgiana Spinner Aroor was immediately notified. Priyanka 05/20/2020        Scheduled Meds: . atorvastatin  80 mg Oral Daily  . Chlorhexidine Gluconate  Cloth  6 each Topical Daily  . docusate  100 mg Oral BID  . mouth rinse  15 mL Mouth Rinse BID  . multivitamin with minerals  1 tablet Per Tube Daily  . polyethylene glycol  17 g Oral Daily  . rivaroxaban  20 mg Oral Q supper  . valproic acid  500 mg Oral BID   Continuous Infusions: . famotidine (PEPCID) IV 20 mg (05/19/20 2224)  . levETIRAcetam 1,500 mg (05/20/20 0434)    Assessment & Plan:   Active Problems:   Seizure (HCC)   New onset seizures: Admitted with new onset seizures. Neurology following EEG: confirmed seizure Patient was loaded with Keppra and phenytoin.  Dose was increased. Patient then switched from the phenytoin to valproic acid yesterday due to interaction of phenytoin with Xarelto EEG showed subclinical seizure yesterday.  EEG was repeated today showed 22nd brief subclinical seizure. Per neurology would not recommend AED since patient clinically improving despite his occasional brief subclinical seizures Continue Depakote 500 mg twice daily check level tomorrow Continue Keppra 1500 mg twice daily Seizure precautions Status post extubation Frequent neuro checks Neurology recommends clinically monitoring for 1 more day before planning to discharge   Acute hypoxic respiratory failure due to seizures: Patient was intubated on arrival, successfully extubated 8/30. Patient is on room air saturating 94%. Acute hypoxia is resolved.  Hypertension:  Stable.  Home meds were held due to BP being  soft   Hyperlipidemia :  Continue statins  Hx. of Left MCA stroke, LV thrombus : Residual right sided weakness. Continue Xarelto  DVT prophylaxis: Xarelto Code Status: Full Family Communication: None at bedside  Status is: Inpatient  Remains inpatient appropriate because:Ongoing diagnostic testing needed not appropriate for outpatient work up   Dispo: The patient is from: Home              Anticipated d/c is to: Home              Anticipated d/c date is: 1 day              Patient currently is not medically stable to d/c.            LOS: 5 days   Time spent: 35 minutes with more than 50% on COC    Lynn Ito, MD [facility] Pager [phone]  If 7PM-7AM, please contact night-coverage [email or web address] Password [facility] 05/20/2020, 9:21 AM

## 2020-05-20 NOTE — NC FL2 (Signed)
shar amery [place] MEDICAID FL2 LEVEL OF CARE SCREENING TOOL     IDENTIFICATION  Patient Name: Corey Meyers Birthdate: 01-06-1951 Sex: male Admission Date (Current Location): 05/15/2020  [place] and [place] Number:  Chiropodist and Address:  [facility], [address], [place], [place] [address]      Provider Number: [id number]  Attending Physician Name and Address:  Lynn Ito, MD  Relative Name and Phone Number:  Andren, Bethea (Spouse) [phone]    Current Level of Care: Hospital Recommended Level of Care: Skilled Nursing Facility Prior Approval Number:    Date Approved/Denied:   PASRR Number: [id number]  Discharge Plan: SNF    Current Diagnoses: Patient Active Problem List   Diagnosis Date Noted  . Seizure (HCC) 05/15/2020  . 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 Essential hypertension 12/27/2018  . Hyperlipidemia 12/27/2018  . NSTEMI (non-ST elevated myocardial infarction) (HCC) 12/27/2018  . Chronic systolic CHF (congestive heart failure) (HCC) 12/27/2018  . 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    Orientation RESPIRATION BLADDER Height & Weight     Self, Time, Place  O2 (2 liters) Continent Weight: 116.8 kg Height:  6' 0.01" (182.9 cm)  BEHAVIORAL SYMPTOMS/MOOD NEUROLOGICAL BOWEL NUTRITION STATUS      Continent Diet (dysphagia 3, mech soft with gravies, pills in puree)  AMBULATORY STATUS COMMUNICATION OF NEEDS Skin   Extensive Assist Verbally Normal                       Personal Care Assistance Level of Assistance  Bathing, Feeding, Dressing Bathing Assistance: Limited assistance Feeding assistance: Limited assistance Dressing Assistance: Limited assistance     Functional Limitations Info             SPECIAL CARE FACTORS FREQUENCY  PT (By licensed  PT), OT (By licensed OT)     PT Frequency: 5 times per week OT Frequency: 5 times per week            Contractures Contractures Info: Not present    Additional Factors Info  Code Status, Allergies Code Status Info: full Allergies Info: NKDA           Current Medications (05/20/2020):  This is the current hospital active medication list Current Facility-Administered Medications  Medication Dose Route Frequency Provider Last Rate Last Admin  . atorvastatin (LIPITOR) tablet 80 mg  80 mg Oral Daily Vida Rigger, MD   80 mg at 05/20/20 0944  . Chlorhexidine Gluconate Cloth 2 % PADS 6 each  6 each Topical Daily Tukov-Yual, Magdalene S, NP   6 each at 05/20/20 0937  . docusate (COLACE) 50 MG/5ML liquid 100 mg  100 mg Oral BID Tukov-Yual, Magdalene S, NP   100 mg at 05/20/20 0944  . docusate sodium (COLACE) capsule 100 mg  100 mg Oral BID PRN Tukov-Yual, Magdalene S, NP      . famotidine (PEPCID) IVPB 20 mg premix  20 mg Intravenous Q12H Tukov-Yual, Magdalene S, NP 100 mL/hr at 05/20/20 0957 20 mg at 05/20/20 0957  . haloperidol lactate (HALDOL) injection 2 mg  2 mg Intravenous Q6H PRN Marrion Coy, MD   2 mg at 05/18/20 1734  . levETIRAcetam (KEPPRA) IVPB 1500 mg/ 100 mL premix  1,500 mg Intravenous Q12H Aroor, Georgiana Spinner R, MD 400 mL/hr at 05/20/20 0434 1,500 mg at 05/20/20 0434  .  LORazepam (ATIVAN) injection 2 mg  2 mg Intravenous PRN Manuela Schwartz, NP   2 mg at 05/18/20 2032  . MEDLINE mouth rinse  15 mL Mouth Rinse BID Vida Rigger, MD   15 mL at 05/20/20 0945  . multivitamin with minerals tablet 1 tablet  1 tablet Per Tube Daily Vida Rigger, MD   1 tablet at 05/20/20 0944  . polyethylene glycol (MIRALAX / GLYCOLAX) packet 17 g  17 g Oral Daily PRN Tukov-Yual, Magdalene S, NP      . polyethylene glycol (MIRALAX / GLYCOLAX) packet 17 g  17 g Oral Daily Tukov-Yual, Magdalene S, NP   17 g at 05/20/20 0946  . rivaroxaban (XARELTO) tablet 20 mg  20 mg Oral Q supper Vida Rigger, MD   20 mg at 05/19/20 1751  . valproic acid (DEPAKENE) 250 MG/5ML solution 500 mg  500 mg Oral BID Aroor, Dara Lords, MD   500 mg at 05/20/20 0944     Discharge Medications: Please see discharge summary for a list of discharge medications.  Relevant Imaging Results:  Relevant Lab Results:   Additional Information SS#: [id number]  Barrie Dunker, RN

## 2020-05-20 NOTE — Progress Notes (Signed)
Speech Language Pathology Treatment: Dysphagia  Patient Details Name: Corey Meyers MRN: [id number] DOB: [birth year] Today's Date: 05/20/2020 Time: 0347-4259 SLP Time Calculation (min) (ACUTE ONLY): 45 min  Assessment / Plan / Recommendation Clinical Impression  Pt seen for ongoing assessment of swallowing. He appears improved today helping to sit upright in bed; verbally responsive and able to follow instructions w/ min cues. Pt does require extra processing Time w/ questions and follow through intermittently -- pt is on much medication s/p Seizures. Pt is on 2L; wbc wnl.  Pt explained general aspiration precautions and agreed verbally to the need for following them especially sitting upright for all oral intake. Pt assisted w/ positioning then presented tray given setup. He fed himself trials of thin liquids, purees, and soft solids w/ NO overt clinical s/s of aspiration noted w/ any consistency; respiratory status remained calm and unlabored, vocal quality clear b/t trials. Pt drank from Cup following instructions for single, small sips slowly. NO straws were utiilized for better oral control. Oral phase appeared grossly [facility] for bolus management and timely A-P transfer for swallowing; oral clearing achieved w/ food consistencies when pt alternated w/ moist foods and w/ liquids. Of note, pt tended to eat impulsively and required min verbal cues to slow down, clear mouth Fully b/f taking another bite, and to use lingual sweeping to aid oral clearing.    Recommend continue a Dysphagia level 3 diet (mech soft) w/ gravies added to moisten foods; Thin liquids via CUP. Recommend general aspiration precautions; Pills Whole in Puree for cohesion and control when swallowing; tray setup and positioning assistance for meals. No further skilled ST services indicated currently; NSG will reconsult if new needs arise while admitted. NSG updated. Precautions posted at bedside.     HPI HPI: Pt is a 69 y.o. 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 History is obtained from ED records as patient is currently intubated and sedated. Patient developed sudden onset confusion and further Aphasia(from Baseline).  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 While in the ambulance, patient had a witnessed seizure and was post-ictal upon arrival in the ED. Seizure lasted about 2.5 minutes. At the ED, he remained obtunded. Pt was extubated on 05/17/2020, ~2 days post intubation.  Per Rehab noted in 12/2018, eval notes reported: "Pt presents with moderate to severe expressive aphasia characterized by impairments in receptive and expressive language with more notable difficulty with verbal expression". Unsure of pt's f/u Rehab post d/c from hospital then.       SLP Plan  All goals met       Recommendations  Diet recommendations: Dysphagia 3 (mechanical soft);Thin liquid (moisten) Liquids provided via: Cup;No straw Medication Administration: Whole meds with puree (for safer swallowing) Supervision: Patient able to self feed;Intermittent supervision to cue for compensatory strategies Compensations: Minimize environmental distractions;Slow rate;Small sips/bites;Lingual sweep for clearance of pocketing;Follow solids with liquid Postural Changes and/or Swallow Maneuvers: Seated upright 90 degrees;Upright 30-60 min after meal (Reflux precautions)                General recommendations:  (Dietician f/u) Oral Care Recommendations: Oral care BID;Oral care before and after PO;Staff/trained caregiver to provide oral care Follow up Recommendations: None SLP Visit Diagnosis: Dysphagia, unspecified (R13.10) Plan: All goals met       GO  Orinda Kenner, MS, CCC-SLP Corey Meyers 05/20/2020, 10:27 AM

## 2020-05-20 NOTE — TOC Progression Note (Signed)
Transition of Care [facility]) - Progression Note    Patient Details  Name: TYSHAN ENDERLE MRN: [id number] Date of Birth: 05/20/1951  Transition of Care [facility]) CM/SW Contact  Barrie Dunker, RN Phone Number: 05/20/2020, 2:42 PM  Clinical Narrative:   Spoke with the spouse Amante Fomby, she stated that she anticipated that he will need to go to short term rehab, the patient is on a dysphagia 3 diet mech soft with gravies, The wife agrees to a bed search, Does not want WOM, The patient has had the vaccines, FL2, PASSR and Bedsearch sent, will review once obtained   Expected Discharge Plan: Skilled Nursing Facility Barriers to Discharge: Continued Medical Work up  Expected Discharge Plan and Services Expected Discharge Plan: Skilled Nursing Facility In-house Referral: Clinical Social Work     Living arrangements for the past 2 months: Single Family Home                                       Social Determinants of Health (SDOH) Interventions    Readmission Risk Interventions No flowsheet data found.

## 2020-05-21 DIAGNOSIS — I513 Intracardiac thrombosis, not elsewhere classified: Secondary | ICD-10-CM

## 2020-05-21 DIAGNOSIS — J9621 Acute and chronic respiratory failure with hypoxia: Secondary | ICD-10-CM

## 2020-05-21 LAB — GLUCOSE, CAPILLARY
Glucose-Capillary: 115 mg/dL — ABNORMAL HIGH (ref 70–99)
Glucose-Capillary: 130 mg/dL — ABNORMAL HIGH (ref 70–99)
Glucose-Capillary: 92 mg/dL (ref 70–99)
Glucose-Capillary: 96 mg/dL (ref 70–99)
Glucose-Capillary: 97 mg/dL (ref 70–99)

## 2020-05-21 LAB — VALPROIC ACID LEVEL: Valproic Acid Lvl: 45 ug/mL — ABNORMAL LOW (ref 50.0–100.0)

## 2020-05-21 MED ORDER — DIVALPROEX SODIUM ER 250 MG PO TB24
750.0000 mg | ORAL_TABLET | Freq: Two times a day (BID) | ORAL | 0 refills | Status: DC
Start: 2020-05-21 — End: 2021-04-22

## 2020-05-21 MED ORDER — FAMOTIDINE 20 MG PO TABS
20.0000 mg | ORAL_TABLET | Freq: Two times a day (BID) | ORAL | 0 refills | Status: DC
Start: 2020-05-21 — End: 2021-04-21

## 2020-05-21 MED ORDER — DIVALPROEX SODIUM ER 500 MG PO TB24
750.0000 mg | ORAL_TABLET | Freq: Two times a day (BID) | ORAL | Status: DC
  Filled 2020-05-21 (×2): qty 1

## 2020-05-21 MED ORDER — ADULT MULTIVITAMIN W/MINERALS CH: 1.0000 | ORAL_TABLET | Freq: Every day | ORAL | 0 refills | Status: DC

## 2020-05-21 MED ORDER — FAMOTIDINE 20 MG PO TABS: 20.0000 mg | ORAL_TABLET | Freq: Two times a day (BID) | ORAL | Status: DC

## 2020-05-21 MED ORDER — LEVETIRACETAM 750 MG PO TABS
1500.0000 mg | ORAL_TABLET | Freq: Two times a day (BID) | ORAL | Status: DC
  Filled 2020-05-21 (×2): qty 2

## 2020-05-21 MED ORDER — DIVALPROEX SODIUM ER 500 MG PO TB24: 750.0000 mg | ORAL_TABLET | Freq: Two times a day (BID) | ORAL | Status: DC

## 2020-05-21 MED ORDER — LEVETIRACETAM 750 MG PO TABS
1500.0000 mg | ORAL_TABLET | Freq: Two times a day (BID) | ORAL | 0 refills | Status: DC
Start: 2020-05-21 — End: 2021-04-21

## 2020-05-21 NOTE — TOC Progression Note (Signed)
Transition of Care [facility]) - Progression Note    Patient Details  Name: Corey Meyers MRN: [id number] Date of Birth: Apr 15, 1951  Transition of Care [facility]) CM/SW Contact  Barrie Dunker, RN Phone Number: 05/21/2020, 2:16 PM  Clinical Narrative:   Spoke with the patient and his wife Corey Meyers  I explained that he did very well with PT and it is recommended for home and not SNF, they both agree, he will follow up with any therapy outpatient    Expected Discharge Plan: Skilled Nursing Facility Barriers to Discharge: Continued Medical Work up  Expected Discharge Plan and Services Expected Discharge Plan: Skilled Nursing Facility In-house Referral: Clinical Social Work     Living arrangements for the past 2 months: Single Family Home                                       Social Determinants of Health (SDOH) Interventions    Readmission Risk Interventions No flowsheet data found.

## 2020-05-21 NOTE — Progress Notes (Signed)
Nutrition Follow-up  DOCUMENTATION CODES:   Obesity unspecified  INTERVENTION:  Encouraged ongoing adequate intake at meals.  NUTRITION DIAGNOSIS:   Inadequate oral intake related to inability to eat as evidenced by NPO status.  Resolved - patient meeting >/= 90% estimated needs.  GOAL:   Patient will meet greater than or equal to 90% of their needs  Met.  MONITOR:   PO intake, Supplement acceptance, Labs, Weight trends, I & O's  REASON FOR ASSESSMENT:   Consult, Ventilator Enteral/tube feeding initiation and management  ASSESSMENT:   69 year old male with history of HLD, HTN, and residual right sided weakness from previous CVA s/p thrombectomy (04/20) presented with new onset seizure.  8/29 extubated 8/30 diet advanced to dysphagia 3 with thin liquids following SLP evaluation  Met with patient at bedside. He reports his appetite and intake are good. Patient is eating 100% of his meals. Ensure Max Protein ONS was discontinued. Per review of [facility] in the past 24 hours patient has had approximately 2785 kcal (>100% estimated needs) and 107 grams of protein (93% minimum estimated needs). Patient reports he does not need an oral nutrition supplement at this time. Encouraged ongoing adequate intake of protein.  Medications reviewed and include: Colace 100 mg BID, famotidine, Keppra, MVI daily, Miralax.  Labs reviewed: CBG 92-115.  Diet Order:   Diet Order            DIET DYS 3 Room service appropriate? Yes with Assist; Fluid consistency: Thin  Diet effective now                EDUCATION NEEDS:   Not appropriate for education at this time  Skin:  Skin Assessment: Reviewed RN Assessment  Last BM:  05/14/2020 per chart  Height:   Ht Readings from Last 1 Encounters:  05/17/20 6' 0.01" (1.829 m)   Weight:   Wt Readings from Last 1 Encounters:  05/21/20 112.8 kg   Ideal Body Weight:  80.9 kg  BMI:  Body mass index is 33.72 kg/m.  Estimated  Nutritional Needs:   Kcal:  2300-2500  Protein:  115-125 grams  Fluid:  >/= 2.3 L/day  Jacklynn Barnacle, MS, RD, LDN Pager number available on Amion

## 2020-05-21 NOTE — Progress Notes (Signed)
Reason for consult: Seizures  Subjective: Patient is awake and alert, had breakfast this morning.   ROS: negative except above  Examination  Vital signs in last 24 hours: Temp:  [98.3 F (36.8 C)-98.8 F (37.1 C)] 98.7 F (37.1 C) (09/02 0806) Pulse Rate:  [90-109] 99 (09/02 0806) Resp:  [17-18] 18 (09/02 0806) BP: (105-123)/(67-88) 105/88 (09/02 0806) SpO2:  [93 %-95 %] 94 % (09/02 0806) Weight:  [112.8 kg] 112.8 kg (09/02 0500)  General: lying in bed CVS: pulse-normal rate and rhythm RS: breathing comfortably Extremities: normal   Neuro: MS: Alert, oriented to place, month, age.  Still has expressive aphasia, mostly nominal aphasia.  Unable to repeat sentences.  Follows commands. CN: pupils equal and reactive, visual fields intact apart from mild right eye quadrantanopsia EOMI, face symmetric, tongue midline, normal sensation over face, Motor: 5/5 strength in all 4 extremities Reflexes:, plantars: flexor Coordination: normal Gait: not tested  Basic Metabolic Panel: Recent Labs  Lab 05/15/20 1730 05/15/20 2348 05/16/20 0442 05/16/20 0442 05/17/20 0442 05/17/20 0442 05/18/20 0458 05/19/20 0209 05/20/20 0527  NA   < >  --  137  --  136  --  140 142 140  K   < >  --  3.3*  --  3.3*  --  3.7 4.1 3.6  CL   < >  --  105  --  101  --  104 103 104  CO2   < >  --  25  --  28  --  30 30 27   GLUCOSE   < >  --  89  --  103*  --  88 100* 88  BUN   < >  --  13  --  16  --  10 8 11   CREATININE   < > 1.08 0.81  --  0.84  --  0.73 0.74 0.78  CALCIUM   < >  --  7.9*   < > 8.1*   < > 8.3* 8.3* 8.7*  MG   < > 2.3 2.0  --  1.8  --  1.9 1.9 2.0  PHOS  --  2.1* 4.1  --   --   --  2.2* 3.0 3.2   < > = values in this interval not displayed.    CBC: Recent Labs  Lab 05/15/20 1730 05/15/20 2348 05/16/20 0442 05/17/20 1748 05/20/20 0527  WBC 16.3* 13.7* 13.9* 11.8* 8.9  NEUTROABS 11.1*  --   --   --   --   HGB 15.6 14.5 13.3 13.1 13.8  HCT 47.8 42.7 39.1 38.3* 40.5  MCV  89.7 87.0 86.3 86.5 86.9  PLT 350 281 269 235 275     Coagulation Studies: No results for input(s): LABPROT, INR in the last 72 hours.  Imaging Reviewed:     ASSESSMENT AND PLAN  69 year old male with chronic left MCA stroke, LV thrombus admitted after presenting status epilepticus. Loaded with 2 g of Keppra and intubated for airway protection, subsequently extubated and remains aphasic. 20-minute routine EEG shows one brief subclinical seizure.  Patient loaded with phenytoin and Keppra dose was increased.  Patient was switched from phenytoin to valproic acid yesterday due to interaction of phenytoin on patient Xarelto.  Yesterday routine EEG again showed subclinical seizure-EEG repeated today after switching from Dilantin to valproic acid.  1 hour EEG showed 20 second brief subclinical seizure.   Clinically patient continues to improve and is conversational this morning.  Nonconvulsive status epilepticus/subclinical seizures  Acute encephalopathy secondary to postictal state  Recommendations Will not add additional AED since patient clinically improving despite occasional brief subclinical seizures. Increase Depakote to 750 mg twice daily, level today at 45. ContinueKeppra to 1500 mg twice daily Continue frequent neurochecks Continue seizure precautions Continue PT OT Continue Xarelto for stroke prevention due to history of LV thrombus     Espiridion Supinski [facility] Pager Number [phone] For questions after 7pm please refer to AMION to reach the Neurologist on call

## 2020-05-21 NOTE — TOC Progression Note (Signed)
Transition of Care [facility]) - Progression Note    Patient Details  Name: Corey Meyers MRN: [id number] Date of Birth: 1951/06/06  Transition of Care [facility]) CM/SW Contact  Barrie Dunker, RN Phone Number: 05/21/2020, 10:47 AM  Clinical Narrative:   The patient's wife [name] called and I let her know that the patient had chosen [facility] in [place], I provided her with the contact information for [facility], She asked about the vaccination status of the staff, I explained that they will wear the appropriate PPE when they take care of Covid patients and if they have covid patients they have them separated from non covid patients, she stated that I had answered her questions and I will call her once we get insurance approval    Expected Discharge Plan: Skilled Nursing Facility Barriers to Discharge: Continued Medical Work up  Expected Discharge Plan and Services Expected Discharge Plan: Skilled Nursing Facility In-house Referral: Clinical Social Work     Living arrangements for the past 2 months: Single Family Home                                       Social Determinants of Health (SDOH) Interventions    Readmission Risk Interventions No flowsheet data found.

## 2020-05-21 NOTE — Plan of Care (Signed)
  Problem: Education: Goal: Knowledge of General Education information will improve Description: Including pain rating scale, medication(s)/side effects and non-pharmacologic comfort measures Outcome: Not Progressing   Problem: Health Behavior/Discharge Planning: Goal: Ability to manage health-related needs will improve Outcome: Not Progressing   

## 2020-05-21 NOTE — Discharge Summary (Signed)
Corey Meyers ZOX:[id number] DOB: 07/22/1951 DOA: 05/15/2020  PCP: [facility], [facility]  Admit date: 05/15/2020 Discharge date: 05/21/2020  Admitted From: home Disposition:  home  Recommendations for Outpatient Follow-up:  1. Follow up with PCP in 1 week 2. Please obtain BMP/CBC in one week 3. Neurology Dr. Sherryll Burger in one week     Discharge Condition:Stable CODE STATUS:full  Diet recommendation: Heart Healthy   Brief/Interim Summary: Per H/P: 69 y/o caucasian male with a medical history as indicated below  who presented to the ED with  AMS. Patient was intubated. Critical was consulted for admission. is currently intubated and sedated. Patient developed sudden onset confusion and aphasia with right sided weakness. 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 While in the ambulance, patient had a witnessed seizure and was post-ictal upon arrival in the ED. Seizure lasted about 2.5 minutes. At the ED, he remained obtunded. Initial CT head stroke study was negative but patient could not get tPA because he was on eliquis and it had been approximately 5 hrs since symptoms started. Due to persistent somnolence and post-ictal confusion and acidosis on VBG, he was intubated. He was admitted to the ICU for further management. Pt was extubated on 8/29 and transferred to the hospitalist service for further management.  Allergy was consulted.   New onset seizures: On admission WJX:[id number] seizure Patient was loaded with Keppra and phenytoin.  Dose was increased. Patient then switched from the phenytoin to valproic acid due to interaction of phenytoin with Xarelto EEG showed subclinical seizure 8/31.  EEG was repeated 9/1 showed 22nd brief subclinical seizure. Per neurology would not recommend AED since patient clinically improving despite his occasional brief subclinical seizures Per neurology recommendation: Increase Depakote to 750 mg twice daily, level today at 45. Continue Depakote  500 mg twice daily check level tomorrow Continue Keppra 1500 mg twice daily Neurology cleared pt for discharge today   Acute hypoxic respiratory failure due to seizures: Patient was intubated on arrival,successfully extubated 8/30. Now stable . Acute hypoxia is resolved.  Hypertension: Stable.  Home meds were held due to BP being soft. Follow with pcp for further mx.   Hyperlipidemia: Continue statins  Hx. of Left MCA stroke, LV thrombus: Residual right sided weakness. Continue Xarelto   Discharge Diagnoses:  Active Problems:   Seizure [facility])    Discharge Instructions  Discharge Instructions    Call MD for:  temperature >100.4   Complete by: As directed    Diet - low sodium heart healthy   Complete by: As directed    Discharge instructions   Complete by: As directed    F/u with pcp in  one week F/u with neurology Dr. Sherryll Burger here in one week   Increase activity slowly   Complete by: As directed      Allergies as of 05/21/2020   No Known Allergies     Medication List    STOP taking these medications   apixaban 5 MG Tabs tablet Commonly known as: ELIQUIS   carvedilol 12.5 MG tablet Commonly known as: COREG   losartan 25 MG tablet Commonly known as: COZAAR     TAKE these medications   atorvastatin 80 MG tablet Commonly known as: LIPITOR Take 1 tablet (80 mg total) by mouth daily at 6 PM.   divalproex 250 MG 24 hr tablet Commonly known as: DEPAKOTE ER Take 3 tablets (750 mg total) by mouth 2 (two) times daily.   famotidine 20 MG tablet Commonly known as: PEPCID Take  1 tablet (20 mg total) by mouth 2 (two) times daily.   feeding supplement (ENSURE ENLIVE) Liqd Take 237 mLs by mouth daily at 3 pm.   levETIRAcetam 750 MG tablet Commonly known as: KEPPRA Take 2 tablets (1,500 mg total) by mouth 2 (two) times daily.   multivitamin with minerals Tabs tablet Place 1 tablet into feeding tube daily. Start taking on: May 22, 2020    nitroGLYCERIN 0.4 MG SL tablet Commonly known as: NITROSTAT Place 0.4 mg under the tongue every 5 (five) minutes as needed for chest pain.   Xarelto 20 MG Tabs tablet Generic drug: rivaroxaban Take 20 mg by mouth daily.       Contact information for follow-up providers    Lonell Face, MD Follow up in 1 week(s).   Specialty: Neurology Contact information: [phone] [facility] [address] [facility] [place] [address] [phone]            Contact information for after-discharge care    Destination    [facility] Preferred SNF .   Service: Skilled Nursing Contact information: [address] [place] [address] [phone]                 No Known Allergies  Consultations:  Neurology  [facility]   Procedures/Studies: EEG  Result Date: 05/20/2020 [name], MD     05/20/2020  1:09 PM Patient Name: Corey Meyers MRN: [id number] Epilepsy Attending: [name] Referring Physician/Provider: Dr Georgiana Spinner Aroor Date:05/20/2020 Duration: 1 hour  Patient history: 69 year old male with chronic left MCA stroke, LV thrombus admitted after presenting status epilepticus. EEG to evaluate for seizure.  Level of alertness: awake, asleep  AEDs during EEG study: LEV, PHT, VPA  Technical aspects: This EEG study was done with scalp electrodes positioned according to the 10-20 International system of electrode placement. Electrical activity was acquired at a sampling rate of  and reviewed with a high frequency filter of  and a low frequency filter of . EEG data were recorded continuously and digitally stored.  Description:  The posterior dominant rhythm consists of 9 Hz activity of moderate voltage (25-35 uV) seen predominantly in posterior head regions, symmetric and reactive to eye opening and eye closing. Sleep was characterized by vertex waves, sleep spindles (12 to 14 Hz), maximal frontocentral region.  Single  seizure without clinical signs was noted at 1215 arising from left anterior frontal region, lasting about 20 seconds. Concomitant EEG showed sharply contoured 6 Hz theta slowing in left anterior temporal region which gradually evolved into 2 to 3 Hz theta slowing and spread to involve left hemisphere as well as right temporal region.    Sharp waves were also seen in left antecubital region.  Essentially 40 driving was not seen during photic stimulation.  Hyperventilation was not performed.  ABNORMALITY -Seizure without clinical signs, left anterior temporalregion -Sharp wave, left anterior temporal region  IMPRESSION: This studyshowedoneseizure without clinical signs at 1215 arising from left anterior temporal region, lasting about 20 seconds.  Additionally, there is evidence of epileptogenicity arising from the left anterior temporal region.  Dr. Georgiana Spinner Aroor was notified.  [name]   EEG  Result Date: 05/19/2020 [name], MD     05/19/2020  4:19 PM Patient Name: Corey Meyers MRN: [id number] Epilepsy Attending: [name] Referring Physician/Provider: Dr Georgiana Spinner Aroor Date: 05/19/2020 Duration: 23.57 mins Patient history: 69 year old male with chronic left MCA stroke, LV thrombus admitted after presenting status  epilepticus. EEG to evaluate for seizure. Level of alertness: asleep AEDs during EEG study: LEV, PHT, VPA Technical aspects: This EEG study was done with scalp electrodes positioned according to the 10-20 International system of electrode placement. Electrical activity was acquired at a sampling rate of 500Hz  and reviewed with a high frequency filter of 70Hz  and a low frequency filter of 1Hz . EEG data were recorded continuously and digitally stored. Description:  Sleep was characterized by vertex waves, sleep spindles (12 to 14 Hz), maximal frontocentral region.  Single seizure without clinical signs was noted at 1512 arising from left anterior frontal region, lasting  about 30 seconds.  Concomitant EEG showed sharply contoured 6 Hz theta slowing in left anterior temporal region which gradually evolved into 2 to 3 Hz theta slowing and spread to involve left hemisphere as well as right temporal region.  Hyperventilation and photic stimulation were not performed.   ABNORMALITY -Seizure without clinical signs, left anterior temporal region  IMPRESSION: This study showed one seizure without clinical signs at 1512 arising from left anterior temporal region, lasting about 30 seconds.  Dr. Georgiana SpinnerSushanth Aroor was notified. Charlsie QuestPriyanka O Yadav   EEG  Result Date: 05/18/2020 Charlsie QuestYadav, Priyanka O, MD     05/18/2020  1:02 PM Patient Name: Corey Meyers MRN: [id number] Epilepsy Attending: Charlsie QuestPriyanka O Yadav Referring Physician/Provider: Dr. Georgiana SpinnerSushanth Aroor Date: 05/18/2020 Duration: 22.43 minutes Patient history: 69 year old male presented with seizure-like episode.  EEG telemetry for seizures. Level of alertness: Awake AEDs during EEG study: Keppra Technical aspects: This EEG study was done with scalp electrodes positioned according to the 10-20 International system of electrode placement. Electrical activity was acquired at a sampling rate of 500Hz  and reviewed with a high frequency filter of 70Hz  and a low frequency filter of 1Hz . EEG data were recorded continuously and digitally stored. Description: The posterior dominant rhythm consists of 9 Hz activity of moderate voltage (25-35 uV) seen predominantly in posterior head regions, symmetric and reactive to eye opening and eye closing. Single seizure without clinical signs was noted at 1159 arising from left anterior frontal region, lasting about 30 seconds.  Concomitant EEG showed sharply contoured 6 Hz theta slowing in left anterior temporal region which gradually evolved into 2 to 3 Hz theta slowing and spread to involve left hemisphere as well as right temporal region.  After the seizure ended, EEG showed generalized background attenuation.   Physiologic photic driving was not seen during photic stimulation.  Hyperventilation as not performed.   ABNORMALITY -Seizure without clinical signs, left anterior temporal region IMPRESSION: This study showed one seizure without clinical signs at 1159 arising from left anterior temporal region, lasting about 30 seconds. Dr. Georgiana SpinnerSushanth Aroor was immediately notified. Charlsie QuestPriyanka O Yadav   CT Angio Head W or Wo Contrast  Result Date: 05/15/2020 CLINICAL DATA:  Acute neuro deficit EXAM: CT ANGIOGRAPHY HEAD AND NECK CT PERFUSION BRAIN TECHNIQUE: Multidetector CT imaging of the head and neck was performed using the standard protocol during bolus administration of intravenous contrast. Multiplanar CT image reconstructions and MIPs were obtained to evaluate the vascular anatomy. Carotid stenosis measurements (when applicable) are obtained utilizing NASCET criteria, using the distal internal carotid diameter as the denominator. Multiphase CT imaging of the brain was performed following IV bolus contrast injection. Subsequent parametric perfusion maps were calculated using RAPID software. CONTRAST:  100mL OMNIPAQUE IOHEXOL 350 MG/ML SOLN COMPARISON:  MRI head 12/22/2018.  CT angio head and neck 12/21/2018 FINDINGS: CT HEAD FINDINGS Brain: Ventricle size and cerebral volume normal.  Chronic infarct left frontal parietal lobe. Small chronic infarct in the left anterior basal ganglia Vascular: Negative for hyperdense vessel Skull: Negative Sinuses/Orbits: Mucosal edema paranasal sinuses. Air-fluid level in the sphenoid sinus. Negative orbit Other: None ASPECTS (Alberta Stroke Program Early CT Score) - Ganglionic level infarction (caudate, lentiform nuclei, internal capsule, insula, M1-M3 cortex): 7 - Supraganglionic infarction (M4-M6 cortex): 3 Total score (0-10 with 10 being normal): 10 Review of the MIP images confirms the above findings CTA NECK FINDINGS Aortic arch: Standard branching. Imaged portion shows no evidence of  aneurysm or dissection. No significant stenosis of the major arch vessel origins. Right carotid system: No significant stenosis. Minimal atherosclerotic calcification right carotid bulb Left carotid system: No significant stenosis. Noncalcified plaque or thrombus left carotid bulb without stenosis. Vertebral arteries: Both vertebral arteries are normal Skeleton: Mild degenerative changes cervical spine. No acute skeletal abnormality. Poor dentition Other neck: Negative for mass or adenopathy in the neck. Upper chest: Lung apices clear bilaterally. Review of the MIP images confirms the above findings CTA HEAD FINDINGS Anterior circulation: Mild atherosclerotic calcification in the cavernous carotid bilaterally. No significant stenosis. Anterior and middle cerebral arteries normal bilaterally without stenosis or large vessel occlusion. Posterior circulation: Both vertebral arteries patent to the basilar. PICA patent bilaterally. Basilar widely patent. AICA, superior cerebellar, posterior cerebral arteries patent bilaterally without stenosis or large vessel occlusion. Venous sinuses: Normal venous enhancement Anatomic variants: None Review of the MIP images confirms the above findings CT Brain Perfusion Findings: ASPECTS: 10 CBF (<30%) Volume: 0mL Perfusion (Tmax>6.0s) volume: 0mL Mismatch Volume: 0mL Infarction Location:None IMPRESSION: 1. No acute intracranial abnormality. Chronic infarcts on the left. Aspects 10 2. No significant right carotid stenosis. Minimal atherosclerotic calcification right carotid bulb. 3. No significant left carotid stenosis. There is a small area of noncalcified soft tissue along the wall of the bulb on the left which may be due to atherosclerotic plaque or adherent thrombus. This was not present on the prior CTA. 4. Both vertebral arteries are normal 5. No intracranial large vessel occlusion or significant stenosis. 6. These results were called by telephone at the time of interpretation on  05/15/2020 at 6:20 pm to provider Shaune Pollack , who verbally acknowledged these results. Electronically Signed   By: Marlan Palau M.D.   On: 05/15/2020 18:16   CT Angio Neck W and/or Wo Contrast  Result Date: 05/15/2020 CLINICAL DATA:  Acute neuro deficit EXAM: CT ANGIOGRAPHY HEAD AND NECK CT PERFUSION BRAIN TECHNIQUE: Multidetector CT imaging of the head and neck was performed using the standard protocol during bolus administration of intravenous contrast. Multiplanar CT image reconstructions and MIPs were obtained to evaluate the vascular anatomy. Carotid stenosis measurements (when applicable) are obtained utilizing NASCET criteria, using the distal internal carotid diameter as the denominator. Multiphase CT imaging of the brain was performed following IV bolus contrast injection. Subsequent parametric perfusion maps were calculated using RAPID software. CONTRAST:  OMNIPAQUE IOHEXOL 350 MG/ML SOLN COMPARISON:  MRI head 12/22/2018.  CT angio head and neck 12/21/2018 FINDINGS: CT HEAD FINDINGS Brain: Ventricle size and cerebral volume normal. Chronic infarct left frontal parietal lobe. Small chronic infarct in the left anterior basal ganglia Vascular: Negative for hyperdense vessel Skull: Negative Sinuses/Orbits: Mucosal edema paranasal sinuses. Air-fluid level in the sphenoid sinus. Negative orbit Other: None ASPECTS (Alberta Stroke Program Early CT Score) - Ganglionic level infarction (caudate, lentiform nuclei, internal capsule, insula, M1-M3 cortex): 7 - Supraganglionic infarction (M4-M6 cortex): 3 Total score (0-10 with 10 being normal): 10  Review of the MIP images confirms the above findings CTA NECK FINDINGS Aortic arch: Standard branching. Imaged portion shows no evidence of aneurysm or dissection. No significant stenosis of the major arch vessel origins. Right carotid system: No significant stenosis. Minimal atherosclerotic calcification right carotid bulb Left carotid system: No significant  stenosis. Noncalcified plaque or thrombus left carotid bulb without stenosis. Vertebral arteries: Both vertebral arteries are normal Skeleton: Mild degenerative changes cervical spine. No acute skeletal abnormality. Poor dentition Other neck: Negative for mass or adenopathy in the neck. Upper chest: Lung apices clear bilaterally. Review of the MIP images confirms the above findings CTA HEAD FINDINGS Anterior circulation: Mild atherosclerotic calcification in the cavernous carotid bilaterally. No significant stenosis. Anterior and middle cerebral arteries normal bilaterally without stenosis or large vessel occlusion. Posterior circulation: Both vertebral arteries patent to the basilar. PICA patent bilaterally. Basilar widely patent. AICA, superior cerebellar, posterior cerebral arteries patent bilaterally without stenosis or large vessel occlusion. Venous sinuses: Normal venous enhancement Anatomic variants: None Review of the MIP images confirms the above findings CT Brain Perfusion Findings: ASPECTS: 10 CBF (<30%) Volume: 0mL Perfusion (Tmax>6.0s) volume: 0mL Mismatch Volume: 0mL Infarction Location:None IMPRESSION: 1. No acute intracranial abnormality. Chronic infarcts on the left. Aspects 10 2. No significant right carotid stenosis. Minimal atherosclerotic calcification right carotid bulb. 3. No significant left carotid stenosis. There is a small area of noncalcified soft tissue along the wall of the bulb on the left which may be due to atherosclerotic plaque or adherent thrombus. This was not present on the prior CTA. 4. Both vertebral arteries are normal 5. No intracranial large vessel occlusion or significant stenosis. 6. These results were called by telephone at the time of interpretation on 05/15/2020 at 6:20 pm to provider Shaune Pollack , who verbally acknowledged these results. Electronically Signed   By: Marlan Palau M.D.   On: 05/15/2020 18:16   MR BRAIN WO CONTRAST  Result Date:  05/17/2020 CLINICAL DATA:  Initial evaluation for acute seizure. EXAM: MRI HEAD WITHOUT CONTRAST TECHNIQUE: Multiplanar, multiecho pulse sequences of the brain and surrounding structures were obtained without intravenous contrast. COMPARISON:  Prior CTs from 05/15/2020. FINDINGS: Brain: Cerebral volume within normal limits for age. Encephalomalacia and gliosis involving the posterior left insula and left frontoparietal region compatible with chronic left MCA territory infarct. Associated lacunar type infarct present at the left caudate head. Scattered areas of associated chronic hemosiderin staining. Increased T2/FLAIR signal intensity with diffusion abnormality seen involving the mesial left temporal lobe/left hippocampal formation (series 6, image 19), nonspecific, but presumably related to history of seizure. No associated susceptibility artifact or mass effect. No other evidence for acute or subacute infarct. Gray-white matter differentiation maintained elsewhere within the brain. No evidence for acute intracranial hemorrhage. No mass lesion or midline shift. Ex vacuo dilatation of the left lateral ventricle related to the chronic left MCA territory infarcts. No hydrocephalus. No extra-axial fluid collection. T1 hyperintensity noted about the sella/pituitary gland, nonspecific, but of doubtful significance in the acute setting. Gland itself is relatively normal in appearance. Vascular: Major intracranial vascular flow voids are maintained. Skull and upper cervical spine: Craniocervical junction within normal limits. Bone marrow signal intensity normal. No scalp soft tissue abnormality. Sinuses/Orbits: Globes and orbital soft tissues within normal limits. Moderate mucosal thickening seen throughout the paranasal sinuses, greatest within the ethmoidal air cells. Fluid seen within the posterior nasopharynx. Bilateral mastoid effusions. Patient likely intubated. Other: None. IMPRESSION: 1. Increased signal  abnormality involving the mesial left temporal lobe/left hippocampal  formation, nonspecific, but presumably related to history of seizure. Correlation with EEG recommended. 2. No other acute intracranial abnormality. 3. Chronic left MCA territory infarcts. Electronically Signed   By: Rise Mu M.D.   On: 05/17/2020 00:22   CT CEREBRAL PERFUSION W CONTRAST  Result Date: 05/15/2020 CLINICAL DATA:  Acute neuro deficit EXAM: CT ANGIOGRAPHY HEAD AND NECK CT PERFUSION BRAIN TECHNIQUE: Multidetector CT imaging of the head and neck was performed using the standard protocol during bolus administration of intravenous contrast. Multiplanar CT image reconstructions and MIPs were obtained to evaluate the vascular anatomy. Carotid stenosis measurements (when applicable) are obtained utilizing NASCET criteria, using the distal internal carotid diameter as the denominator. Multiphase CT imaging of the brain was performed following IV bolus contrast injection. Subsequent parametric perfusion maps were calculated using RAPID software. CONTRAST:  OMNIPAQUE IOHEXOL 350 MG/ML SOLN COMPARISON:  MRI head 12/22/2018.  CT angio head and neck 12/21/2018 FINDINGS: CT HEAD FINDINGS Brain: Ventricle size and cerebral volume normal. Chronic infarct left frontal parietal lobe. Small chronic infarct in the left anterior basal ganglia Vascular: Negative for hyperdense vessel Skull: Negative Sinuses/Orbits: Mucosal edema paranasal sinuses. Air-fluid level in the sphenoid sinus. Negative orbit Other: None ASPECTS (Alberta Stroke Program Early CT Score) - Ganglionic level infarction (caudate, lentiform nuclei, internal capsule, insula, M1-M3 cortex): 7 - Supraganglionic infarction (M4-M6 cortex): 3 Total score (0-10 with 10 being normal): 10 Review of the MIP images confirms the above findings CTA NECK FINDINGS Aortic arch: Standard branching. Imaged portion shows no evidence of aneurysm or dissection. No significant stenosis of  the major arch vessel origins. Right carotid system: No significant stenosis. Minimal atherosclerotic calcification right carotid bulb Left carotid system: No significant stenosis. Noncalcified plaque or thrombus left carotid bulb without stenosis. Vertebral arteries: Both vertebral arteries are normal Skeleton: Mild degenerative changes cervical spine. No acute skeletal abnormality. Poor dentition Other neck: Negative for mass or adenopathy in the neck. Upper chest: Lung apices clear bilaterally. Review of the MIP images confirms the above findings CTA HEAD FINDINGS Anterior circulation: Mild atherosclerotic calcification in the cavernous carotid bilaterally. No significant stenosis. Anterior and middle cerebral arteries normal bilaterally without stenosis or large vessel occlusion. Posterior circulation: Both vertebral arteries patent to the basilar. PICA patent bilaterally. Basilar widely patent. AICA, superior cerebellar, posterior cerebral arteries patent bilaterally without stenosis or large vessel occlusion. Venous sinuses: Normal venous enhancement Anatomic variants: None Review of the MIP images confirms the above findings CT Brain Perfusion Findings: ASPECTS: 10 CBF (<30%) Volume: 0mL Perfusion (Tmax>6.0s) volume: 0mL Mismatch Volume: 0mL Infarction Location:None IMPRESSION: 1. No acute intracranial abnormality. Chronic infarcts on the left. Aspects 10 2. No significant right carotid stenosis. Minimal atherosclerotic calcification right carotid bulb. 3. No significant left carotid stenosis. There is a small area of noncalcified soft tissue along the wall of the bulb on the left which may be due to atherosclerotic plaque or adherent thrombus. This was not present on the prior CTA. 4. Both vertebral arteries are normal 5. No intracranial large vessel occlusion or significant stenosis. 6. These results were called by telephone at the time of interpretation on 05/15/2020 at 6:20 pm to provider Shaune Pollack ,  who verbally acknowledged these results. Electronically Signed   By: Marlan Palau M.D.   On: 05/15/2020 18:16   DG Chest Portable 1 View  Result Date: 05/15/2020 CLINICAL DATA:  Endotracheal tube and orogastric tube placement. EXAM: PORTABLE CHEST 1 VIEW COMPARISON:  May 15, 2020 (7:37 p.m.) FINDINGS: An  endotracheal tube is seen with its distal tip approximately 5.8 cm from the carina. A nasogastric tube is noted with its distal end extending below the level of the diaphragm. There is no evidence of acute infiltrate, pleural effusion or pneumothorax. The cardiac silhouette is moderately enlarged. Degenerative changes seen throughout the thoracic spine. IMPRESSION: 1. Endotracheal tube and nasogastric tube in satisfactory position. Electronically Signed   By: Aram Candela M.D.   On: 05/15/2020 21:49   DG Chest Portable 1 View  Result Date: 05/15/2020 CLINICAL DATA:  Code stroke EXAM: PORTABLE CHEST 1 VIEW COMPARISON:  12/22/2018 FINDINGS: Cardiomegaly, vascular congestion and interstitial prominence may reflect interstitial edema. Bibasilar atelectasis. No effusions. No acute bony abnormality. IMPRESSION: Cardiomegaly with vascular congestion and probable mild interstitial edema. Electronically Signed   By: Charlett Nose M.D.   On: 05/15/2020 19:51   DG Abd Portable 1 View  Result Date: 05/15/2020 CLINICAL DATA:  Orogastric tube placement. EXAM: PORTABLE ABDOMEN - 1 VIEW COMPARISON:  None. FINDINGS: A nasogastric tube is seen with its distal tip overlying the expected region of the gastric antrum. Dilated small bowel loops are seen overlying the mid to lower left abdomen. No radio-opaque calculi or other significant radiographic abnormality are seen. IMPRESSION: Nasogastric tube positioning, as described above. Electronically Signed   By: Aram Candela M.D.   On: 05/15/2020 21:54   ECHOCARDIOGRAM COMPLETE  Result Date: 05/16/2020    ECHOCARDIOGRAM REPORT   Patient Name:   Corey Meyers  Date of Exam: 05/16/2020 Medical Rec #:  [id number]   Height:       72.0 in Accession #:    [id number]  Weight:       251.8 lb Date of Birth:  [birth year]   BSA:          2.350 m Patient Age:    69 years    BP:           100/66 mmHg Patient Gender: M           HR:           61 bpm. Exam Location:  [facility] Procedure: 2D Echo Indications:     STROKE 434.91/I163.9  History:         Patient has prior history of Echocardiogram examinations, most                  recent 12/22/2018. Previous Myocardial Infarction, Stroke; Risk                  Factors:Hypertension and Dyslipidemia.  Sonographer:     Johnathan Hausen Referring Phys:  [id number] MAGDALENE S TUKOV-YUAL Diagnosing Phys: Julien Nordmann MD  Sonographer Comments: Technically difficult study due to poor echo windows and suboptimal apical window. IMPRESSIONS  1. Left ventricular ejection fraction, by estimation, is 35 to 40%. The left ventricle has moderately decreased function. Severe hypokinesis of the anterior, anteroseptal and septal region. . Left ventricular diastolic parameters are consistent with Grade II diastolic dysfunction (pseudonormalization).  2. Right ventricular systolic function is normal. The right ventricular size is normal. There is normal pulmonary artery systolic pressure.  3. Left atrial size was mildly dilated. FINDINGS  Left Ventricle: Left ventricular ejection fraction, by estimation, is 35 to 40%. The left ventricle has moderately decreased function. The left ventricle has no regional wall motion abnormalities. Definity contrast agent was given IV to delineate the left ventricular endocardial borders. The left ventricular internal cavity size was normal in size. There is no left ventricular  hypertrophy. Left ventricular diastolic parameters are consistent with Grade II diastolic dysfunction (pseudonormalization). Right Ventricle: The right ventricular size is normal. No increase in right ventricular wall thickness. Right ventricular systolic function  is normal. There is normal pulmonary artery systolic pressure. The tricuspid regurgitant velocity is 2.10 m/s, and  with an assumed right atrial pressure of 10 mmHg, the estimated right ventricular systolic pressure is 27.6 mmHg. Left Atrium: Left atrial size was mildly dilated. Right Atrium: Right atrial size was normal in size. Pericardium: There is no evidence of pericardial effusion. Mitral Valve: The mitral valve is normal in structure. Normal mobility of the mitral valve leaflets. No evidence of mitral valve regurgitation. No evidence of mitral valve stenosis. Tricuspid Valve: The tricuspid valve is normal in structure. Tricuspid valve regurgitation is mild . No evidence of tricuspid stenosis. Aortic Valve: The aortic valve was not well visualized. Aortic valve regurgitation is not visualized. No aortic stenosis is present. Pulmonic Valve: The pulmonic valve was normal in structure. Pulmonic valve regurgitation is not visualized. No evidence of pulmonic stenosis. Aorta: The aortic root is normal in size and structure. Venous: The inferior vena cava is normal in size with greater than 50% respiratory variability, suggesting right atrial pressure of 3 mmHg. IAS/Shunts: No atrial level shunt detected by color flow Doppler.  LEFT VENTRICLE PLAX 2D LVIDd:         5.21 cm  Diastology LVIDs:         4.10 cm  LV e' lateral:   11.20 cm/s LV PW:         0.78 cm  LV E/e' lateral: 7.3 LV IVS:        1.03 cm  LV e' medial:    6.96 cm/s LVOT diam:     1.90 cm  LV E/e' medial:  11.8 LVOT Area:     2.84 cm  RIGHT VENTRICLE             IVC RV S prime:     11.10 cm/s  IVC diam: 1.79 cm LEFT ATRIUM             Index       RIGHT ATRIUM           Index LA diam:        4.50 cm 1.92 cm/m  RA Area:     14.60 cm LA Vol (A2C):   32.9 ml 14.00 ml/m RA Volume:   36.20 ml  15.41 ml/m LA Vol (A4C):   37.5 ml 15.96 ml/m LA Biplane Vol: 35.0 ml 14.90 ml/m   AORTA Ao Root diam: 3.40 cm MITRAL VALVE               TRICUSPID VALVE MV Area  (PHT): 3.72 cm    TR Peak grad:   17.6 mmHg MV Decel Time: 204 msec    TR Vmax:        210.00 cm/s MV E velocity: 82.00 cm/s MV A velocity: 51.90 cm/s  SHUNTS MV E/A ratio:  1.58        Systemic Diam: 1.90 cm Julien Nordmann MD Electronically signed by Julien Nordmann MD Signature Date/Time: 05/16/2020/3:19:14 PM    Final        Subjective: Patient looking better today.  Patient feels good.  Interactive with me today.  Has no complaints.  He actually walked with PT although PT stated he needed home health PT they decided that he actually does not needed.  Discharge Exam: Vitals:   05/21/20  0221 05/21/20 0806  BP: 123/67 105/88  Pulse: 90 99  Resp: 18 18  Temp: 98.3 F (36.8 C) 98.7 F (37.1 C)  SpO2: 95% 94%   Vitals:   05/20/20 1702 05/21/20 0221 05/21/20 0500 05/21/20 0806  BP: 111/78 123/67  105/88  Pulse: (!) 109 90  99  Resp: 17 18  18   Temp: 98.8 F (37.1 C) 98.3 F (36.8 C)  98.7 F (37.1 C)  TempSrc: Oral   Oral  SpO2: 93% 95%  94%  Weight:   112.8 kg   Height:        General: Pt is alert, awake, not in acute distress Cardiovascular: RRR, S1/S2 +, no rubs, no gallops Respiratory: CTA bilaterally, no wheezing, no rhonchi Abdominal: Soft, NT, ND, bowel sounds + Extremities: no edema, no cyanosis Neuro: Alert oriented x3    The results of significant diagnostics from this hospitalization (including imaging, microbiology, ancillary and laboratory) are listed below for reference.     Microbiology: Recent Results (from the past 240 hour(s))  SARS Coronavirus 2 by RT PCR (hospital order, performed in [facility] hospital lab) Nasopharyngeal Nasopharyngeal Swab     Status: None   Collection Time: 05/15/20  5:30 PM   Specimen: Nasopharyngeal Swab  Result Value Ref Range Status   SARS Coronavirus 2 NEGATIVE NEGATIVE Final    Comment: (NOTE) SARS-CoV-2 target nucleic acids are NOT DETECTED.  The SARS-CoV-2 RNA is generally detectable in upper and lower respiratory  specimens during the acute phase of infection. The lowest concentration of SARS-CoV-2 viral copies this assay can detect is 250 copies / mL. A negative result does not preclude SARS-CoV-2 infection and should not be used as the sole basis for treatment or other patient management decisions.  A negative result may occur with improper specimen collection / handling, submission of specimen other than nasopharyngeal swab, presence of viral mutation(s) within the areas targeted by this assay, and inadequate number of viral copies (<250 copies / mL). A negative result must be combined with clinical observations, patient history, and epidemiological information.  Fact Sheet for Patients:   BoilerBrush.com.cy  Fact Sheet for Healthcare Providers: https://pope.com/  This test is not yet approved or  cleared by the [place] FDA and has been authorized for detection and/or diagnosis of SARS-CoV-2 by FDA under an Emergency Use Authorization (EUA).  This EUA will remain in effect (meaning this test can be used) for the duration of the COVID-19 declaration under Section 564(b)(1) of the Act, 21 U.S.C. section 360bbb-3(b)(1), unless the authorization is terminated or revoked sooner.  Performed at [facility], [address] [place], [place] [address]   MRSA PCR Screening     Status: None   Collection Time: 05/16/20  2:25 AM   Specimen: Nasopharyngeal  Result Value Ref Range Status   MRSA by PCR NEGATIVE NEGATIVE Final    Comment:        The GeneXpert MRSA Assay (FDA approved for NASAL specimens only), is one component of a comprehensive MRSA colonization surveillance program. It is not intended to diagnose MRSA infection nor to guide or monitor treatment for MRSA infections. Performed at [facility], [address] [place], [place] [address]      Labs: BNP (last 3 results) No results for input(s): BNP in the  last 8760 hours. Basic Metabolic Panel: Recent Labs  Lab 05/15/20 1730 05/15/20 2348 05/16/20 0442 05/17/20 0442 05/18/20 0458 05/19/20 0209 05/20/20 0527  NA   < >  --  137 136 140 142 140  K   < >  --  3.3* 3.3* 3.7 4.1 3.6  CL   < >  --  105 101 104 103 104  CO2   < >  --  25 28 30 30 27   GLUCOSE   < >  --  89 103* 88 100* 88  BUN   < >  --  13 16 10 8 11   CREATININE   < > 1.08 0.81 0.84 0.73 0.74 0.78  CALCIUM   < >  --  7.9* 8.1* 8.3* 8.3* 8.7*  MG   < > 2.3 2.0 1.8 1.9 1.9 2.0  PHOS  --  2.1* 4.1  --  2.2* 3.0 3.2   < > = values in this interval not displayed.   Liver Function Tests: Recent Labs  Lab 05/15/20 1730 05/19/20 0209  AST 36  --   ALT 17  --   ALKPHOS 82  --   BILITOT 0.7  --   PROT 8.2*  --   ALBUMIN 4.1 3.5   No results for input(s): LIPASE, AMYLASE in the last 168 hours. No results for input(s): AMMONIA in the last 168 hours. CBC: Recent Labs  Lab 05/15/20 1730 05/15/20 2348 05/16/20 0442 05/17/20 1748 05/20/20 0527  WBC 16.3* 13.7* 13.9* 11.8* 8.9  NEUTROABS 11.1*  --   --   --   --   HGB 15.6 14.5 13.3 13.1 13.8  HCT 47.8 42.7 39.1 38.3* 40.5  MCV 89.7 87.0 86.3 86.5 86.9  PLT 350 281 269 235 275   Cardiac Enzymes: No results for input(s): CKTOTAL, CKMB, CKMBINDEX, TROPONINI in the last 168 hours. BNP: Invalid input(s): POCBNP CBG: Recent Labs  Lab 05/20/20 2101 05/21/20 0029 05/21/20 0452 05/21/20 0808 05/21/20 1131  GLUCAP 110* 96 92 97 115*   D-Dimer No results for input(s): DDIMER in the last 72 hours. Hgb A1c No results for input(s): HGBA1C in the last 72 hours. Lipid Profile No results for input(s): CHOL, HDL, LDLCALC, TRIG, CHOLHDL, LDLDIRECT in the last 72 hours. Thyroid function studies No results for input(s): TSH, T4TOTAL, T3FREE, THYROIDAB in the last 72 hours.  Invalid input(s): FREET3 Anemia work up No results for input(s): VITAMINB12, FOLATE, FERRITIN, TIBC, IRON, RETICCTPCT in the last 72  hours. Urinalysis    Component Value Date/Time   COLORURINE RED (A) 05/20/2020 1716   APPEARANCEUR CLOUDY (A) 05/20/2020 1716   LABSPEC 1.023 05/20/2020 1716   PHURINE 6.0 05/20/2020 1716   GLUCOSEU NEGATIVE 05/20/2020 1716   HGBUR LARGE (A) 05/20/2020 1716   BILIRUBINUR NEGATIVE 05/20/2020 1716   KETONESUR 5 (A) 05/20/2020 1716   PROTEINUR >=300 (A) 05/20/2020 1716   NITRITE NEGATIVE 05/20/2020 1716   LEUKOCYTESUR SMALL (A) 05/20/2020 1716   Sepsis Labs Invalid input(s): PROCALCITONIN,  WBC,  LACTICIDVEN Microbiology Recent Results (from the past 240 hour(s))  SARS Coronavirus 2 by RT PCR (hospital order, performed in [facility] hospital lab) Nasopharyngeal Nasopharyngeal Swab     Status: None   Collection Time: 05/15/20  5:30 PM   Specimen: Nasopharyngeal Swab  Result Value Ref Range Status   SARS Coronavirus 2 NEGATIVE NEGATIVE Final    Comment: (NOTE) SARS-CoV-2 target nucleic acids are NOT DETECTED.  The SARS-CoV-2 RNA is generally detectable in upper and lower respiratory specimens during the acute phase of infection. The lowest concentration of SARS-CoV-2 viral copies this assay can detect is 250 copies / mL. A negative result does not preclude SARS-CoV-2 infection  and should not be used as the sole basis for treatment or other patient management decisions.  A negative result may occur with improper specimen collection / handling, submission of specimen other than nasopharyngeal swab, presence of viral mutation(s) within the areas targeted by this assay, and inadequate number of viral copies (<250 copies / mL). A negative result must be combined with clinical observations, patient history, and epidemiological information.  Fact Sheet for Patients:   BoilerBrush.com.cy  Fact Sheet for Healthcare Providers: https://pope.com/  This test is not yet approved or  cleared by the [place] FDA and has been authorized  for detection and/or diagnosis of SARS-CoV-2 by FDA under an Emergency Use Authorization (EUA).  This EUA will remain in effect (meaning this test can be used) for the duration of the COVID-19 declaration under Section 564(b)(1) of the Act, 21 U.S.C. section 360bbb-3(b)(1), unless the authorization is terminated or revoked sooner.  Performed at [facility], [address] [place], [place] [address]   MRSA PCR Screening     Status: None   Collection Time: 05/16/20  2:25 AM   Specimen: Nasopharyngeal  Result Value Ref Range Status   MRSA by PCR NEGATIVE NEGATIVE Final    Comment:        The GeneXpert MRSA Assay (FDA approved for NASAL specimens only), is one component of a comprehensive MRSA colonization surveillance program. It is not intended to diagnose MRSA infection nor to guide or monitor treatment for MRSA infections. Performed at [facility], [address], [place], [place] [address]      Time coordinating discharge: Over 30 minutes  SIGNED:   Lynn Ito, MD  [facility] 05/21/2020, 3:58 PM Pager   If 7PM-7AM, please contact night-coverage [email or web address] Password [email or web address]

## 2020-05-21 NOTE — TOC Progression Note (Signed)
Transition of Care [facility]) - Progression Note    Patient Details  Name: Corey Meyers MRN: [id number] Date of Birth: 1951-05-10  Transition of Care [facility]) CM/SW Contact  Barrie Dunker, RN Phone Number: 05/21/2020, 10:12 AM  Clinical Narrative:   Spoke with the patient in the room, he is alert and oriented, He is agreeable to go to SNF, We reviewed the bed offers and star rating from medicare he chose the bed at [facility], I let him know I tried to call his wife today to discuss and asked him if he would let her know that he is choosing [facility], I called [name] at [facility] and asked that he start the insurance auth for [facility] and let him know that the physician would like to DC if the bed is ready. I requested a call back    Expected Discharge Plan: Skilled Nursing Facility Barriers to Discharge: Continued Medical Work up  Expected Discharge Plan and Services Expected Discharge Plan: Skilled Nursing Facility In-house Referral: Clinical Social Work     Living arrangements for the past 2 months: Single Family Home                                       Social Determinants of Health (SDOH) Interventions    Readmission Risk Interventions No flowsheet data found.

## 2020-05-21 NOTE — Progress Notes (Signed)
Patient's wife said he had shorts and glasses case when he came from ICU. She is very upset. We are unable to locate these items on 1A and have put in a call to ICU to see if they are still there. If they are not there we will put in a Safety Zone portal.

## 2020-05-21 NOTE — Evaluation (Signed)
Physical Therapy Evaluation Patient Details Name: Corey Meyers MRN: [id number] DOB: [birth year] Today's Date: 05/21/2020   History of Present Illness  Pt is a 69 y.o. male that presented to ED for seizures, aphasia, and R sided weakness. EMS witnessed seizure, lasting ~2.5 minutes, post-ictal upon arrival. Initial CT head stroke study negative. Due to persistent somnolence, confusion, and acidosis on VBG, pt intubated for airway protection, extubated 8/29.. 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    Clinical Impression  Patient easily woken byverbal and tactile cues. Pt reported PLOF, stated he is independent, lives alone but his wife lives in her own home nearby, able to provide assist as needed.   The patient demonstrated bed mobility modI, transferred sit <> stand modI. He was able to ambulate ~137ft without AD. Occasional gait path deviations noted, pt endorsed bilateral feet numbness that he said is new, no unsteadiness or LOB noted. Pt returned to room, up in chair. LE and UE strength and coordination WFLs. The patient would benefit from further skilled PT intervention to maximize balance and activity tolerance, recommendation is HHPT.    Follow Up Recommendations Home health PT    Equipment Recommendations  None recommended by PT    Recommendations for Other Services       Precautions / Restrictions Precautions Precautions: Fall Precaution Comments: low fall Restrictions Weight Bearing Restrictions: No      Mobility  Bed Mobility Overal bed mobility: Modified Independent                Transfers Overall transfer level: Modified independent Equipment used: None             General transfer comment: use of hands for transfer  Ambulation/Gait   Gait Distance (Feet): 130 Feet Assistive device: None       General Gait Details: occasional gait path deviations noted, pt endorsed bilateral feet  numbness that he said is new, no unsteadiness or LOB noted  Stairs            Wheelchair Mobility    Modified Rankin (Stroke Patients Only)       Balance Overall balance assessment: Needs assistance   Sitting balance-Leahy Scale: Normal       Standing balance-Leahy Scale: Good                               Pertinent Vitals/Pain Pain Assessment: No/denies pain    Home Living Family/patient expects to be discharged to:: Private residence Living Arrangements: Alone;Other (Comment) (spouse lives in her own house, but pt said he is able to ask her for help as needed) Available Help at Discharge: Family;Available PRN/intermittently Type of Home: House Home Access: Stairs to enter Entrance Stairs-Rails: None Entrance Stairs-Number of Steps: 1 Home Layout: One level Home Equipment: None      Prior Function Level of Independence: Independent         Comments: denied recent falls     Hand Dominance   Dominant Hand: Right    Extremity/Trunk Assessment   Upper Extremity Assessment Upper Extremity Assessment: RUE deficits/detail;LUE deficits/detail RUE Deficits / Details: grossly 4+/5, shoulder flexion 4/5 RUE Sensation: WNL RUE Coordination: WNL LUE Deficits / Details: grossly 4+/5 LUE Sensation: WNL LUE Coordination: WNL    Lower Extremity Assessment Lower Extremity Assessment: RLE deficits/detail;LLE deficits/detail RLE Deficits / Details: grossly 4+/5 RLE Coordination: WNL LLE Deficits / Details: grossly 4+/5  LLE Coordination: WNL    Cervical / Trunk Assessment Cervical / Trunk Assessment: Normal  Communication   Communication: Expressive difficulties  Cognition Arousal/Alertness: Awake/alert Behavior During Therapy: WFL for tasks assessed/performed Overall Cognitive Status: Within Functional Limits for tasks assessed                                        General Comments      Exercises     Assessment/Plan     PT Assessment Patient needs continued PT services  PT Problem List Decreased mobility;Decreased activity tolerance;Decreased balance       PT Treatment Interventions DME instruction;Therapeutic exercise;Gait training;Balance training;Stair training;Neuromuscular re-education;Functional mobility training;Patient/family education;Therapeutic activities    PT Goals (Current goals can be found in the Care Plan section)  Acute Rehab PT Goals Patient Stated Goal: to get stronger PT Goal Formulation: With patient Time For Goal Achievement: 06/04/20 Potential to Achieve Goals: Good    Frequency Min 2X/week   Barriers to discharge        Co-evaluation               AM-PAC PT "6 Clicks" Mobility  Outcome Measure Help needed turning from your back to your side while in a flat bed without using bedrails?: None Help needed moving from lying on your back to sitting on the side of a flat bed without using bedrails?: None Help needed moving to and from a bed to a chair (including a wheelchair)?: None Help needed standing up from a chair using your arms (e.g., wheelchair or bedside chair)?: None Help needed to walk in hospital room?: None Help needed climbing 3-5 steps with a railing? : A Little 6 Click Score: 23    End of Session Equipment Utilized During Treatment: Gait belt Activity Tolerance: Patient tolerated treatment well Patient left: in bed;with call bell/phone within reach;with chair alarm set Nurse Communication: Mobility status PT Visit Diagnosis: Other abnormalities of gait and mobility (R26.89)    Time: 1339-1406 PT Time Calculation (min) (ACUTE ONLY): 27 min   Charges:   PT Evaluation $PT Eval Low Complexity: 1 Low PT Treatments $Therapeutic Exercise: 8-22 mins      Olga Coaster PT, DPT 2:56 PM,05/21/20

## 2021-04-21 ENCOUNTER — Other Ambulatory Visit: Payer: Self-pay

## 2021-04-21 ENCOUNTER — Inpatient Hospital Stay
Admission: EM | Admit: 2021-04-21 | Discharge: 2021-04-22 | DRG: 101 | Disposition: A | Discharge status: Other Acute Inpatient Hospital | Payer: Medicare HMO | Attending: Internal Medicine | Admitting: Internal Medicine

## 2021-04-21 ENCOUNTER — Emergency Department: Payer: Medicare HMO

## 2021-04-21 DIAGNOSIS — R059 Cough, unspecified: Secondary | ICD-10-CM

## 2021-04-21 DIAGNOSIS — G9389 Other specified disorders of brain: Secondary | ICD-10-CM | POA: Diagnosis present

## 2021-04-21 DIAGNOSIS — I69351 Hemiplegia and hemiparesis following cerebral infarction affecting right dominant side: Secondary | ICD-10-CM

## 2021-04-21 DIAGNOSIS — G40909 Epilepsy, unspecified, not intractable, without status epilepticus: Secondary | ICD-10-CM | POA: Diagnosis not present

## 2021-04-21 DIAGNOSIS — Z87891 Personal history of nicotine dependence: Secondary | ICD-10-CM

## 2021-04-21 DIAGNOSIS — R4701 Aphasia: Secondary | ICD-10-CM | POA: Diagnosis present

## 2021-04-21 DIAGNOSIS — R569 Unspecified convulsions: Secondary | ICD-10-CM

## 2021-04-21 DIAGNOSIS — I639 Cerebral infarction, unspecified: Secondary | ICD-10-CM

## 2021-04-21 DIAGNOSIS — I2510 Atherosclerotic heart disease of native coronary artery without angina pectoris: Secondary | ICD-10-CM | POA: Diagnosis present

## 2021-04-21 DIAGNOSIS — I10 Essential (primary) hypertension: Secondary | ICD-10-CM | POA: Diagnosis present

## 2021-04-21 DIAGNOSIS — Z79899 Other long term (current) drug therapy: Secondary | ICD-10-CM

## 2021-04-21 DIAGNOSIS — R4182 Altered mental status, unspecified: Secondary | ICD-10-CM | POA: Diagnosis not present

## 2021-04-21 DIAGNOSIS — I5022 Chronic systolic (congestive) heart failure: Secondary | ICD-10-CM | POA: Diagnosis present

## 2021-04-21 DIAGNOSIS — I110 Hypertensive heart disease with heart failure: Secondary | ICD-10-CM | POA: Diagnosis present

## 2021-04-21 DIAGNOSIS — I69398 Other sequelae of cerebral infarction: Secondary | ICD-10-CM

## 2021-04-21 DIAGNOSIS — G4089 Other seizures: Secondary | ICD-10-CM | POA: Diagnosis present

## 2021-04-21 DIAGNOSIS — Z20822 Contact with and (suspected) exposure to covid-19: Secondary | ICD-10-CM | POA: Diagnosis present

## 2021-04-21 DIAGNOSIS — R9401 Abnormal electroencephalogram [EEG]: Secondary | ICD-10-CM | POA: Diagnosis present

## 2021-04-21 DIAGNOSIS — Z7901 Long term (current) use of anticoagulants: Secondary | ICD-10-CM

## 2021-04-21 DIAGNOSIS — G9340 Encephalopathy, unspecified: Secondary | ICD-10-CM

## 2021-04-21 DIAGNOSIS — E785 Hyperlipidemia, unspecified: Secondary | ICD-10-CM | POA: Diagnosis present

## 2021-04-21 LAB — CBC
HCT: 42.1 % (ref 39.0–52.0)
Hemoglobin: 15 g/dL (ref 13.0–17.0)
MCH: 31.1 pg (ref 26.0–34.0)
MCHC: 35.6 g/dL (ref 30.0–36.0)
MCV: 87.2 fL (ref 80.0–100.0)
Platelets: 321 10*3/uL (ref 150–400)
RBC: 4.83 MIL/uL (ref 4.22–5.81)
RDW: 13 % (ref 11.5–15.5)
WBC: 10.9 10*3/uL — ABNORMAL HIGH (ref 4.0–10.5)
nRBC: 0 % (ref 0.0–0.2)

## 2021-04-21 LAB — DIFFERENTIAL
Abs Immature Granulocytes: 0.05 10*3/uL (ref 0.00–0.07)
Basophils Absolute: 0 10*3/uL (ref 0.0–0.1)
Basophils Relative: 0 %
Eosinophils Absolute: 0 10*3/uL (ref 0.0–0.5)
Eosinophils Relative: 0 %
Immature Granulocytes: 1 %
Lymphocytes Relative: 9 %
Lymphs Abs: 1 10*3/uL (ref 0.7–4.0)
Monocytes Absolute: 0.3 10*3/uL (ref 0.1–1.0)
Monocytes Relative: 3 %
Neutro Abs: 9.6 10*3/uL — ABNORMAL HIGH (ref 1.7–7.7)
Neutrophils Relative %: 87 %

## 2021-04-21 LAB — CBG MONITORING, ED: Glucose-Capillary: 121 mg/dL — ABNORMAL HIGH (ref 70–99)

## 2021-04-21 LAB — COMPREHENSIVE METABOLIC PANEL
ALT: 19 U/L (ref 0–44)
AST: 25 U/L (ref 15–41)
Albumin: 4 g/dL (ref 3.5–5.0)
Alkaline Phosphatase: 67 U/L (ref 38–126)
Anion gap: 6 (ref 5–15)
BUN: 15 mg/dL (ref 8–23)
CO2: 31 mmol/L (ref 22–32)
Calcium: 9.1 mg/dL (ref 8.9–10.3)
Chloride: 101 mmol/L (ref 98–111)
Creatinine, Ser: 0.92 mg/dL (ref 0.61–1.24)
GFR, Estimated: >60 mL/min (ref >60)
Glucose, Bld: 122 mg/dL — ABNORMAL HIGH (ref 70–99)
Potassium: 3.9 mmol/L (ref 3.5–5.1)
Sodium: 138 mmol/L (ref 135–145)
Total Bilirubin: 0.6 mg/dL (ref 0.3–1.2)
Total Protein: 8.3 g/dL — ABNORMAL HIGH (ref 6.5–8.1)

## 2021-04-21 LAB — PROTIME-INR
INR: 1.2 (ref 0.8–1.2)
Prothrombin Time: 15.1 seconds (ref 11.4–15.2)

## 2021-04-21 LAB — APTT: aPTT: 28 seconds (ref 24–36)

## 2021-04-21 LAB — VALPROIC ACID LEVEL: Valproic Acid Lvl: 22 ug/mL — ABNORMAL LOW (ref 50.0–100.0)

## 2021-04-21 MED ORDER — ACETAMINOPHEN 325 MG PO TABS: 650.0000 mg | ORAL_TABLET | ORAL | Status: DC | PRN

## 2021-04-21 MED ORDER — CARVEDILOL 6.25 MG PO TABS: 6.2500 mg | ORAL_TABLET | Freq: Two times a day (BID) | ORAL | Status: DC

## 2021-04-21 MED ORDER — ATORVASTATIN CALCIUM 80 MG PO TABS: 80.0000 mg | ORAL_TABLET | Freq: Every day | ORAL | Status: DC

## 2021-04-21 MED ORDER — ACETAMINOPHEN 160 MG/5ML PO SOLN
650.0000 mg | ORAL | Status: DC | PRN
  Filled 2021-04-21: qty 20.3

## 2021-04-21 MED ORDER — ACETAMINOPHEN 650 MG RE SUPP: 650.0000 mg | RECTAL | Status: DC | PRN

## 2021-04-21 MED ORDER — LEVETIRACETAM 500 MG PO TABS: 500.0000 mg | ORAL_TABLET | Freq: Two times a day (BID) | ORAL | Status: DC

## 2021-04-21 MED ORDER — SODIUM CHLORIDE 0.9% FLUSH
3.0000 mL | Freq: Once | INTRAVENOUS | Status: AC
Start: 2021-04-21 — End: 2021-04-21
  Administered 2021-04-21: 3 mL via INTRAVENOUS

## 2021-04-21 MED ORDER — DIVALPROEX SODIUM ER 250 MG PO TB24: 750.0000 mg | ORAL_TABLET | Freq: Two times a day (BID) | ORAL | Status: DC

## 2021-04-21 MED ORDER — LEVETIRACETAM IN NACL 1500 MG/100ML IV SOLN
1500.0000 mg | Freq: Two times a day (BID) | INTRAVENOUS | Status: DC
  Administered 2021-04-21 – 2021-04-22 (×2): 1500 mg via INTRAVENOUS
  Filled 2021-04-21 (×4): qty 100

## 2021-04-21 MED ORDER — LORAZEPAM 2 MG/ML IJ SOLN
1.0000 mg | Freq: Once | INTRAMUSCULAR | Status: AC
  Administered 2021-04-21: 1 mg via INTRAVENOUS
  Filled 2021-04-21: qty 1

## 2021-04-21 MED ORDER — IOHEXOL 350 MG/ML SOLN
75.0000 mL | Freq: Once | INTRAVENOUS | Status: AC | PRN
  Administered 2021-04-21: 75 mL via INTRAVENOUS
  Filled 2021-04-21: qty 75

## 2021-04-21 MED ORDER — STROKE: EARLY STAGES OF RECOVERY BOOK: Freq: Once | Status: AC

## 2021-04-21 MED ORDER — RIVAROXABAN 20 MG PO TABS
20.0000 mg | ORAL_TABLET | Freq: Every day | ORAL | Status: DC
  Filled 2021-04-21: qty 1

## 2021-04-21 MED ORDER — LEVETIRACETAM IN NACL 1000 MG/100ML IV SOLN: 1000.0000 mg | Freq: Once | INTRAVENOUS | Status: DC

## 2021-04-21 MED ORDER — SODIUM CHLORIDE 0.9 % IV SOLN: INTRAVENOUS | Status: DC

## 2021-04-21 NOTE — ED Triage Notes (Addendum)
See first nurse note- pt to ER via [facility] with c/o stroke like symptoms. Wife and pt live separately. Wife last spoke with patient at 2330. Didn't hear from him this morning, went over and found himn laying in bed and he was unable to speak/ aphasic/ unable to follow commands other than squeeze fingers. Had a stroke last year. Hx of seizures (one in 2020). Takes blood thinners.   Pupils equal round and reactive.

## 2021-04-21 NOTE — H&P (Signed)
History and Physical    DAMONI CAUSBY KVQ:[id number] DOB: 1950/12/26 DOA: 04/21/2021  PCP: Lauro Regulus, MD   Patient coming from: home  I have personally briefly reviewed patient's old medical records in [facility]  Chief Complaint: confusion  HPI: Corey Meyers is a 70 y.o. male with medical history significant for Systolic heart failure, CAD, HTN, prior stroke  with residual right-sided weakness and seizure, on Keppra and Xarelto, who presents to the ED with expressive aphasia with last known normal the night prior.  Apparently wife found him confused, speaking words that did not make sense yet appeared to understand when she gave commands.  He was previously in his usual state of health.  ED course: On arrival, vitals within normal limits except for borderline O2 sat of 91% on room air Blood work mostly unremarkable with a WBC 11,000 valproic acid low at 22  EKG, personally viewed and interpreted: NSR at 77 with nonspecific ST-T wave changes : Imaging: CTA head with no LVO and CTA neck. CT head with remote infarcts in the left parietal lobe and left basal ganglia.  No acute intracranial abnormality  The ED provider spoke with neurology who will see in AM.  Keppra which patient discontinued a week prior will be resumed.  Hospitalist consulted for admission.  Review of Systems: Unable to obtain as patient not comprehending commands  Past Medical History:  Diagnosis Date   Hyperlipidemia    Hypertension    Stroke [facility])     Past Surgical History:  Procedure Laterality Date   IR CT HEAD LTD  12/21/2018   IR PERCUTANEOUS ART THROMBECTOMY/INFUSION INTRACRANIAL INC DIAG ANGIO  12/21/2018   LAMINECTOMY     RADIOLOGY WITH ANESTHESIA N/A 12/21/2018   Procedure: RADIOLOGY WITH ANESTHESIA;  Surgeon: Julieanne Cotton, MD;  Location: [facility] OR;  Service: Radiology;  Laterality: N/A;   TONSILLECTOMY       reports that he has quit smoking. He has never used smokeless tobacco. He reports  current alcohol use. No history on file for drug use.  No Known Allergies  Family history: Unable to obtain due to patient being unable to communicate   Prior to Admission medications   Medication Sig Start Date End Date Taking? Authorizing Provider  atorvastatin (LIPITOR) 80 MG tablet Take 1 tablet (80 mg total) by mouth daily at 6 PM. 12/27/18  Yes Biby, Jani Files, NP  carvedilol (COREG) 6.25 MG tablet Take 6.25 mg by mouth 2 (two) times daily. 02/09/21  Yes [provider]  divalproex (DEPAKOTE ER) 250 MG 24 hr tablet Take 3 tablets (750 mg total) by mouth 2 (two) times daily. 05/21/20  Yes Lynn Ito, MD  Multiple Vitamin (MULTIVITAMIN WITH MINERALS) TABS tablet Place 1 tablet into feeding tube daily. 05/22/20  Yes Amery, Freddi Che, MD  XARELTO 20 MG TABS tablet Take 20 mg by mouth daily. 03/11/20  Yes [provider]  feeding supplement, ENSURE ENLIVE, (ENSURE ENLIVE) LIQD Take 237 mLs by mouth daily at 3 pm. Patient not taking: Reported on 05/15/2020 12/27/18   Layne Benton, NP  levETIRAcetam (KEPPRA) 500 MG tablet Take 500 mg by mouth 2 (two) times daily. Patient not taking: Reported on 04/21/2021 02/11/21   [provider]  nitroGLYCERIN (NITROSTAT) 0.4 MG SL tablet Place 0.4 mg under the tongue every 5 (five) minutes as needed for chest pain.  01/03/17   [provider]    Physical Exam: Vitals:   04/21/21 1247 04/21/21 1252 04/21/21  1446 04/21/21 1811  BP: 122/75  115/81 118/80  Pulse: 74  78 75  Resp: 16  16 16   Temp: 97.6 F (36.4 C)     TempSrc: Oral     SpO2: 91%  97% 98%  Height:  6' (1.829 m)       Vitals:   04/21/21 1247 04/21/21 1252 04/21/21 1446 04/21/21 1811  BP: 122/75  115/81 118/80  Pulse: 74  78 75  Resp: 16  16 16   Temp: 97.6 F (36.4 C)     TempSrc: Oral     SpO2: 91%  97% 98%  Height:  6' (1.829 m)        Constitutional: Alert unable to assess orientation due to expressive aphasia. Not in any apparent distress HEENT:       Head: Normocephalic and atraumatic.         Eyes: PERLA, EOMI, Conjunctivae are normal. Sclera is non-icteric.       Mouth/Throat: Mucous membranes are moist.       Neck: Supple with no signs of meningismus. Cardiovascular: Regular rate and rhythm. No murmurs, gallops, or rubs. 2+ symmetrical distal pulses are present . No JVD. No LE edema Respiratory: Respiratory effort normal .Lungs sounds clear bilaterally. No wheezes, crackles, or rhonchi.  Gastrointestinal: Soft, non tender, and non distended with positive bowel sounds.  Genitourinary: No CVA tenderness. Musculoskeletal: Nontender with normal range of motion in all extremities. No cyanosis, or erythema of extremities. Neurologic: Patient with both receptive and expressive aphasia.  Unable to follow commands.  At times appears to understand but articulated speech unrelated to question asked.  Unable to assess strength in extremities as patient is unable to follow commands skin: Skin is warm, dry.  No rash or ulcers Psychiatric: Mood and affect are normal    Labs on Admission: I have personally reviewed following labs and imaging studies  CBC: Recent Labs  Lab 04/21/21 1249  WBC 10.9*  NEUTROABS 9.6*  HGB 15.0  HCT 42.1  MCV 87.2  PLT 321   Basic Metabolic Panel: Recent Labs  Lab 04/21/21 1249  NA 138  K 3.9  CL 101  CO2 31  GLUCOSE 122*  BUN 15  CREATININE 0.92  CALCIUM 9.1   GFR: CrCl cannot be calculated (Unknown ideal weight.). Liver Function Tests: Recent Labs  Lab 04/21/21 1249  AST 25  ALT 19  ALKPHOS 67  BILITOT 0.6  PROT 8.3*  ALBUMIN 4.0   No results for input(s): LIPASE, AMYLASE in the last 168 hours. No results for input(s): AMMONIA in the last 168 hours. Coagulation Profile: Recent Labs  Lab 04/21/21 1249  INR 1.2   Cardiac Enzymes: No results for input(s): CKTOTAL, CKMB, CKMBINDEX, TROPONINI in the last 168 hours. BNP (last 3 results) No results for input(s): PROBNP in the last 8760  hours. HbA1C: No results for input(s): HGBA1C in the last 72 hours. CBG: Recent Labs  Lab 04/21/21 1248  GLUCAP 121*   Lipid Profile: No results for input(s): CHOL, HDL, LDLCALC, TRIG, CHOLHDL, LDLDIRECT in the last 72 hours. Thyroid Function Tests: No results for input(s): TSH, T4TOTAL, FREET4, T3FREE, THYROIDAB in the last 72 hours. Anemia Panel: No results for input(s): VITAMINB12, FOLATE, FERRITIN, TIBC, IRON, RETICCTPCT in the last 72 hours. Urine analysis:    Component Value Date/Time   COLORURINE RED (A) 05/20/2020 1716   APPEARANCEUR CLOUDY (A) 05/20/2020 1716   LABSPEC 1.023 05/20/2020 1716   PHURINE 6.0 05/20/2020 1716   GLUCOSEU NEGATIVE 05/20/2020  1716   HGBUR LARGE (A) 05/20/2020 1716   BILIRUBINUR NEGATIVE 05/20/2020 1716   KETONESUR 5 (A) 05/20/2020 1716   PROTEINUR >=300 (A) 05/20/2020 1716   NITRITE NEGATIVE 05/20/2020 1716   LEUKOCYTESUR SMALL (A) 05/20/2020 1716    Radiological Exams on Admission: CT ANGIO HEAD NECK W WO CM  Result Date: 04/21/2021 CLINICAL DATA:  Neuro deficit, acute, stroke suspected EXAM: CT ANGIOGRAPHY HEAD AND NECK TECHNIQUE: Multidetector CT imaging of the head and neck was performed using the standard protocol during bolus administration of intravenous contrast. Multiplanar CT image reconstructions and MIPs were obtained to evaluate the vascular anatomy. Carotid stenosis measurements (when applicable) are obtained utilizing NASCET criteria, using the distal internal carotid diameter as the denominator. CONTRAST:  40mL OMNIPAQUE IOHEXOL 350 MG/ML SOLN COMPARISON:  None. FINDINGS: CTA NECK FINDINGS Motion limited evaluation.  Within this limitation: Aortic arch: Great vessel origins are patent. Right carotid system: No evidence of dissection, stenosis (50% or greater) or occlusion. Left carotid system: No evidence of dissection, stenosis (50% or greater) or occlusion. There is a shelf-like filling defect along the posterior aspect of the left  ICA origin, compatible with a carotid web (see series 8, images 39 through 142) Vertebral arteries: Codominant. No evidence of dissection, stenosis (50% or greater) or occlusion. Skeleton: Moderate multilevel degenerative change. Flowing anterior osteophytes at multiple levels throughout the cervical and imaged upper thoracic spine, suggestive of diffuse idiopathic skeletal hyperostosis. Other neck: No acute findings. Upper chest: Visualized lung apices are clear. Review of the MIP images confirms the above findings CTA HEAD FINDINGS Anterior circulation: Bilateral intracranial ICAs, MCAs, and ACAs are patent without proximal hemodynamically significant stenosis. Calcific atherosclerosis of bilateral ICAs without greater than 50% stenosis. Posterior circulation: Bilateral intradural vertebral arteries, basilar artery, and posterior cerebral arteries are patent without proximal hemodynamically significant stenosis. Venous sinuses: As permitted by contrast timing, patent. Review of the MIP images confirms the above findings IMPRESSION: CTA head: No large vessel occlusion or proximal hemodynamically significant stenosis. CTA neck: 1. No evidence of significant (greater than 50%) stenosis on this motion limited exam. 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 This finding has been reported to increase the risk of ischemic stroke. 3. Elongated styloid processes bilaterally extending to the hyoid bone, which can be seen with Eagle syndrome in the correct clinical setting. Electronically Signed   By: Feliberto Harts MD   On: 04/21/2021 19:09   CT HEAD WO CONTRAST  Result Date: 04/21/2021 CLINICAL DATA:  Altered. EXAM: CT HEAD WITHOUT CONTRAST TECHNIQUE: Contiguous axial images were obtained from the base of the skull through the vertex without intravenous contrast. COMPARISON:  05/15/2020. FINDINGS: Brain: No evidence of acute large vascular territory infarction, hemorrhage, hydrocephalus, extra-axial collection or mass  lesion/mass effect. Remote infarct in the left parietal lobe. Small remote infarct in the anterior left basal ganglia. Associated ex vacuo ventricular dilation. Vascular: No hyperdense vessel identified. Calcific atherosclerosis. Skull: No acute fracture. Sinuses/Orbits: Moderate scattered ethmoid air cell and bilateral frontoethmoidal recess mucosal thickening. No acute findings in the visualized orbits. Other: No mastoid effusions. IMPRESSION: 1. No evidence of acute intracranial abnormality. 2. Remote infarcts in the left parietal lobe and left basal ganglia. 3. Moderate scattered ethmoid air cell and bilateral frontoethmoidal recess mucosal thickening. Electronically Signed   By: Feliberto Harts MD   On: 04/21/2021 15:15     Assessment/Plan 70 year old male with history of systolic heart failure, CAD, HTN, prior stroke with residual seizure and right-sided weakness, on  Keppra and Xarelto, presenting with confusion/expressive aphasia outside tPA window     Acute CVA, with history of CVA   Seizure as late effect of CVA - Patient presenting with confusion, both receptive and expressive aphasia, with last known well greater than 12 hours prior, outside tPA window - CTA with no LVO.  CT head negative for acute infarct - Neurology already consulted from the ED: New CVA versus seizure - Continue neurologic checks - Resume home Keppra and continue Depakote - Continue Xarelto -Echo and EEG ordered -Neurology consulted    Essential hypertension   Chronic systolic CHF  - Continue carvedilol  CAD - Continue carvedilol, atorvastatin and nitroglycerin  DVT prophylaxis: Xarelto Code Status: full code  Family Communication:  none  Disposition Plan: Back to previous home environment Consults called: neurology  Status:observation    Andris Baumann MD [facility]     04/21/2021, 8:02 PM

## 2021-04-21 NOTE — ED Notes (Signed)
Patient transported to CT 

## 2021-04-21 NOTE — ED Triage Notes (Signed)
Pt comes into the ED via [facility] from home c/o stroke like symptoms with LKW last night at 23:30.  Pt is having aphasia.  Pt is ambulatory with good grip strength.  Pt does take blood thinners (xarelto).  143/84, 84 HR, CBG 118, 97% RA, 16g L AC.

## 2021-04-21 NOTE — ED Notes (Signed)
Corey Meyers (wife)- [phone]

## 2021-04-21 NOTE — ED Provider Notes (Signed)
[facility] Emergency Department Provider Note  ____________________________________________   Event Date/Time   First MD Initiated Contact with Patient 04/21/21 1634     (approximate)  I have reviewed the triage vital signs and the nursing notes.   HISTORY  Chief Complaint Altered Mental Status    HPI Corey Meyers is a 70 y.o. male  with h/o stroke, seizures, here with speech difficulty. Pt last known normal was last night. Wife (who lives separately), reportedly called to check on him this morning and he initially didn't pick up, which is abnormal. When he did pick up, pt was confused and not making any sense. She came to check on him and found him confused, not speaking words that made sense yet following commands. She made him perform a stroke screen and did not notice any new weakness, facial droop, or other issues. She does not know if he had any weakness or other symptoms. Did not speak to him before going to bed last night. No other complaints.  Level 5 caveat invoked as remainder of history, ROS, and physical exam limited due to patient's aphasia.         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) 12/27/2018   Essential hypertension 12/27/2018   Hyperlipidemia 12/27/2018   NSTEMI (non-ST elevated myocardial infarction) (HCC) 12/27/2018   Chronic systolic CHF (congestive heart failure) (HCC) 12/27/2018   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 Authorizing Provider  atorvastatin (LIPITOR) 80 MG tablet Take 1 tablet (80 mg total) by mouth daily at 6 PM. 12/27/18  Yes Biby, Jani Files, NP  carvedilol (COREG) 6.25 MG tablet Take 6.25 mg by mouth 2 (two) times daily. 02/09/21  Yes [provider]  divalproex (DEPAKOTE ER) 250 MG 24 hr tablet Take 3 tablets (750 mg total) by mouth 2 (two) times daily. 05/21/20  Yes Lynn Ito, MD  Multiple Vitamin (MULTIVITAMIN WITH MINERALS) TABS tablet Place 1 tablet into feeding tube daily. 05/22/20  Yes Amery, Freddi Che, MD  XARELTO 20 MG TABS tablet Take 20 mg by mouth daily. 03/11/20  Yes [provider]  feeding supplement, ENSURE ENLIVE, (ENSURE ENLIVE) LIQD Take 237 mLs by mouth daily at 3 pm. Patient not taking: Reported on 05/15/2020 12/27/18   Layne Benton, NP  levETIRAcetam (KEPPRA) 500 MG tablet Take 500 mg by mouth 2 (two) times daily. Patient not taking: Reported on 04/21/2021 02/11/21   [provider]  nitroGLYCERIN (NITROSTAT) 0.4 MG SL tablet Place 0.4 mg under the tongue every 5 (five) minutes as needed for chest  pain.  01/03/17   [provider]    Allergies Patient has no known allergies.  No family history on file.  Social History Social History   Tobacco Use   Smoking status: Former   Smokeless tobacco: Never  Building services engineer Use: Never used  Substance Use Topics   Alcohol use: Yes    Review of Systems  Review of Systems  Unable to perform ROS: Mental status change    ____________________________________________  PHYSICAL EXAM:      VITAL SIGNS: ED Triage  Vitals  Enc Vitals Group     BP 04/21/21 1247 122/75     Pulse Rate 04/21/21 1247 74     Resp 04/21/21 1247 16     Temp 04/21/21 1247 97.6 F (36.4 C)     Temp Source 04/21/21 1247 Oral     SpO2 04/21/21 1247 91 %     Weight --      Height 04/21/21 1252 6' (1.829 m)     Head Circumference --      Peak Flow --      Pain Score --      Pain Loc --      Pain Edu? --      Excl. in GC? --      Physical Exam Vitals and nursing note reviewed.  Constitutional:      General: He is not in acute distress.    Appearance: He is well-developed.  HENT:     Head: Normocephalic and atraumatic.  Eyes:     Conjunctiva/sclera: Conjunctivae normal.  Cardiovascular:     Rate and Rhythm: Normal rate and regular rhythm.     Heart sounds: Normal heart sounds.  Pulmonary:     Effort: Pulmonary effort is normal. No respiratory distress.     Breath sounds: No wheezing.  Abdominal:     General: There is no distension.  Musculoskeletal:     Cervical back: Neck supple.  Skin:    General: Skin is warm.     Capillary Refill: Capillary refill takes less than 2 seconds.     Findings: No rash.  Neurological:     Mental Status: He is alert and oriented to person, place, and time.     Motor: No abnormal muscle tone.     Comments: Neurological Exam:  Mental Status: Alert and oriented to person, place, and time. Attention and concentration normal. Speech with dense expressive aphasia. Follows commands, seems to understand questions/commands. Recent memory is intact. Cranial Nerves: Visual fields grossly intact. EOMI and PERRLA. No nystagmus noted. Facial sensation intact at forehead, maxillary cheek, and chin/mandible bilaterally. No facial asymmetry or weakness. Hearing grossly normal. Uvula is midline, and palate elevates symmetrically. Normal SCM and trapezius strength. Tongue midline without fasciculations. Motor: Muscle strength 5/5 in proximal and distal UE and LE bilaterally. No pronator drift. Muscle  tone normal. Sensation: Intact to light touch in upper and lower extremities distally bilaterally.  Gait: Normal Coordination: Normal FTN bilaterally.         ____________________________________________   LABS (all labs ordered are listed, but only abnormal results are displayed)  Labs Reviewed  CBC - Abnormal; Notable for the following components:      Result Value   WBC 10.9 (*)    All other components within normal limits  DIFFERENTIAL - Abnormal; Notable for the following components:   Neutro Abs 9.6 (*)    All other components within normal limits  COMPREHENSIVE METABOLIC PANEL - Abnormal;  Notable for the following components:   Glucose, Bld 122 (*)    Total Protein 8.3 (*)    All other components within normal limits  VALPROIC ACID LEVEL - Abnormal; Notable for the following components:   Valproic Acid Lvl 22 (*)    All other components within normal limits  CBG MONITORING, ED - Abnormal; Notable for the following components:   Glucose-Capillary 121 (*)    All other components within normal limits  PROTIME-INR  APTT  LEVETIRACETAM LEVEL  I-STAT CREATININE, ED  CBG MONITORING, ED    ____________________________________________  EKG: Normal sinus rhythm, VR 77. PR 160, QRS 88 , QTc 434. No acute St elevation or depression. No ischemia or infarct. ________________________________________  RADIOLOGY All imaging, including plain films, CT scans, and ultrasounds, independently reviewed by me, and interpretations confirmed via formal radiology reads.  ED MD interpretation:   CT Head: NAICA, remote lacunar infarct noted CT Angio Head/Neck: No LVO  Official radiology report(s): CT ANGIO HEAD NECK W WO CM  Result Date: 04/21/2021 CLINICAL DATA:  Neuro deficit, acute, stroke suspected EXAM: CT ANGIOGRAPHY HEAD AND NECK TECHNIQUE: Multidetector CT imaging of the head and neck was performed using the standard protocol during bolus administration of intravenous  contrast. Multiplanar CT image reconstructions and MIPs were obtained to evaluate the vascular anatomy. Carotid stenosis measurements (when applicable) are obtained utilizing NASCET criteria, using the distal internal carotid diameter as the denominator. CONTRAST:  75mL OMNIPAQUE IOHEXOL 350 MG/ML SOLN COMPARISON:  None. FINDINGS: CTA NECK FINDINGS Motion limited evaluation.  Within this limitation: Aortic arch: Great vessel origins are patent. Right carotid system: No evidence of dissection, stenosis (50% or greater) or occlusion. Left carotid system: No evidence of dissection, stenosis (50% or greater) or occlusion. There is a shelf-like filling defect along the posterior aspect of the left ICA origin, compatible with a carotid web (see series 8, images 39 through 142) Vertebral arteries: Codominant. No evidence of dissection, stenosis (50% or greater) or occlusion. Skeleton: Moderate multilevel degenerative change. Flowing anterior osteophytes at multiple levels throughout the cervical and imaged upper thoracic spine, suggestive of diffuse idiopathic skeletal hyperostosis. Other neck: No acute findings. Upper chest: Visualized lung apices are clear. Review of the MIP images confirms the above findings CTA HEAD FINDINGS Anterior circulation: Bilateral intracranial ICAs, MCAs, and ACAs are patent without proximal hemodynamically significant stenosis. Calcific atherosclerosis of bilateral ICAs without greater than 50% stenosis. Posterior circulation: Bilateral intradural vertebral arteries, basilar artery, and posterior cerebral arteries are patent without proximal hemodynamically significant stenosis. Venous sinuses: As permitted by contrast timing, patent. Review of the MIP images confirms the above findings IMPRESSION: CTA head: No large vessel occlusion or proximal hemodynamically significant stenosis. CTA neck: 1. No evidence of significant (greater than 50%) stenosis on this motion limited exam. 2. 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 This finding has been reported to increase the risk of ischemic stroke. 3. Elongated styloid processes bilaterally extending to the hyoid bone, which can be seen with Eagle syndrome in the correct clinical setting. Electronically Signed   By: Feliberto HartsFrederick S Jones MD   On: 04/21/2021 19:09   CT HEAD WO CONTRAST  Result Date: 04/21/2021 CLINICAL DATA:  Altered. EXAM: CT HEAD WITHOUT CONTRAST TECHNIQUE: Contiguous axial images were obtained from the base of the skull through the vertex without intravenous contrast. COMPARISON:  05/15/2020. FINDINGS: Brain: No evidence of acute large vascular territory infarction, hemorrhage, hydrocephalus, extra-axial collection or mass lesion/mass effect. Remote infarct in the left parietal  lobe. Small remote infarct in the anterior left basal ganglia. Associated ex vacuo ventricular dilation. Vascular: No hyperdense vessel identified. Calcific atherosclerosis. Skull: No acute fracture. Sinuses/Orbits: Moderate scattered ethmoid air cell and bilateral frontoethmoidal recess mucosal thickening. No acute findings in the visualized orbits. Other: No mastoid effusions. IMPRESSION: 1. No evidence of acute intracranial abnormality. 2. Remote infarcts in the left parietal lobe and left basal ganglia. 3. Moderate scattered ethmoid air cell and bilateral frontoethmoidal recess mucosal thickening. Electronically Signed   By: Feliberto Harts MD   On: 04/21/2021 15:15    ____________________________________________  PROCEDURES   Procedure(s) performed (including Critical Care):  Procedures  ____________________________________________  INITIAL IMPRESSION / MDM / ASSESSMENT AND PLAN / ED COURSE  As part of my medical decision making, I reviewed the following data within the electronic MEDICAL RECORD NUMBER Nursing notes reviewed and incorporated, Old chart reviewed, Notes from prior ED visits, and [place] Controlled Substance Database       *Haik Mahoney Gearhart  was evaluated in Emergency Department on 04/21/2021 for the symptoms described in the history of present illness. He was evaluated in the context of the global COVID-19 pandemic, which necessitated consideration that the patient might be at risk for infection with the SARS-CoV-2 virus that causes COVID-19. Institutional protocols and algorithms that pertain to the evaluation of patients at risk for COVID-19 are in a state of rapid change based on information released by regulatory bodies including the CDC and federal and state organizations. These policies and algorithms were followed during the patient's care in the ED.  Some ED evaluations and interventions may be delayed as a result of limited staffing during the pandemic.*     Medical Decision Making:  70 yo M here with expressive aphasia. Suspect acute CVA, versus partial seizure. Pt has h/o seizure and per report from significant other, actually just stopped his Keppra about 10 days ago. Pt given ativan, keppra. CT head shows NAICA. No weakness or signs to suggest LVO, and CT Angio obtained, shows no signs of LVO. 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 Will plan to hospitalist.   Spoke with Dr. Wilford Corner of Neuro who agrees, will see pt in AM. Agrees with IV Keppra load, checking Depakote level and dosing if low. Discussed case with wife who is in agreement.  ____________________________________________  FINAL CLINICAL IMPRESSION(S) / ED DIAGNOSES  Final diagnoses:  Encephalopathy  Cerebrovascular accident (CVA), unspecified mechanism (HCC)     MEDICATIONS GIVEN DURING THIS VISIT:  Medications  levETIRAcetam (KEPPRA) IVPB 1500 mg/ 100 mL premix (0 mg Intravenous Stopped 04/21/21 1929)  sodium chloride flush (NS) 0.9 % injection 3 mL (3 mLs Intravenous Given 04/21/21 1812)  LORazepam (ATIVAN) injection 1 mg (1 mg Intravenous Given 04/21/21 1834)  iohexol (OMNIPAQUE) 350 MG/ML injection 75 mL (75 mLs Intravenous Contrast Given 04/21/21  1819)     ED Discharge Orders     None        Note:  This document was prepared using Dragon voice recognition software and may include unintentional dictation errors.   Shaune Pollack, MD 04/21/21 2232

## 2021-04-22 ENCOUNTER — Observation Stay: Payer: Medicare HMO

## 2021-04-22 ENCOUNTER — Encounter (HOSPITAL_COMMUNITY): Payer: Self-pay | Admitting: Internal Medicine

## 2021-04-22 ENCOUNTER — Inpatient Hospital Stay (HOSPITAL_COMMUNITY): Payer: Medicare HMO

## 2021-04-22 ENCOUNTER — Inpatient Hospital Stay (HOSPITAL_COMMUNITY)
Admission: AD | Admit: 2021-04-22 | Discharge: 2021-04-26 | DRG: 100 | Disposition: A | Discharge status: Home / Self Care | Payer: Medicare HMO | Source: Other Acute Inpatient Hospital | Attending: Internal Medicine | Admitting: Internal Medicine

## 2021-04-22 DIAGNOSIS — I69398 Other sequelae of cerebral infarction: Secondary | ICD-10-CM | POA: Diagnosis not present

## 2021-04-22 DIAGNOSIS — E785 Hyperlipidemia, unspecified: Secondary | ICD-10-CM | POA: Diagnosis present

## 2021-04-22 DIAGNOSIS — Z20822 Contact with and (suspected) exposure to covid-19: Secondary | ICD-10-CM | POA: Diagnosis present

## 2021-04-22 DIAGNOSIS — R4701 Aphasia: Secondary | ICD-10-CM | POA: Diagnosis present

## 2021-04-22 DIAGNOSIS — G9340 Encephalopathy, unspecified: Secondary | ICD-10-CM | POA: Diagnosis not present

## 2021-04-22 DIAGNOSIS — I5021 Acute systolic (congestive) heart failure: Secondary | ICD-10-CM | POA: Diagnosis not present

## 2021-04-22 DIAGNOSIS — Z79899 Other long term (current) drug therapy: Secondary | ICD-10-CM | POA: Diagnosis not present

## 2021-04-22 DIAGNOSIS — I69351 Hemiplegia and hemiparesis following cerebral infarction affecting right dominant side: Secondary | ICD-10-CM | POA: Diagnosis not present

## 2021-04-22 DIAGNOSIS — I2510 Atherosclerotic heart disease of native coronary artery without angina pectoris: Secondary | ICD-10-CM | POA: Diagnosis present

## 2021-04-22 DIAGNOSIS — R9401 Abnormal electroencephalogram [EEG]: Secondary | ICD-10-CM | POA: Diagnosis present

## 2021-04-22 DIAGNOSIS — G40919 Epilepsy, unspecified, intractable, without status epilepticus: Secondary | ICD-10-CM | POA: Diagnosis not present

## 2021-04-22 DIAGNOSIS — R569 Unspecified convulsions: Secondary | ICD-10-CM

## 2021-04-22 DIAGNOSIS — Z9114 Patient's other noncompliance with medication regimen: Secondary | ICD-10-CM | POA: Diagnosis not present

## 2021-04-22 DIAGNOSIS — I513 Intracardiac thrombosis, not elsewhere classified: Secondary | ICD-10-CM | POA: Diagnosis present

## 2021-04-22 DIAGNOSIS — Z7982 Long term (current) use of aspirin: Secondary | ICD-10-CM | POA: Diagnosis not present

## 2021-04-22 DIAGNOSIS — E876 Hypokalemia: Secondary | ICD-10-CM | POA: Diagnosis present

## 2021-04-22 DIAGNOSIS — G4089 Other seizures: Secondary | ICD-10-CM | POA: Diagnosis present

## 2021-04-22 DIAGNOSIS — I110 Hypertensive heart disease with heart failure: Secondary | ICD-10-CM | POA: Diagnosis present

## 2021-04-22 DIAGNOSIS — I5022 Chronic systolic (congestive) heart failure: Secondary | ICD-10-CM | POA: Diagnosis present

## 2021-04-22 DIAGNOSIS — N390 Urinary tract infection, site not specified: Secondary | ICD-10-CM | POA: Diagnosis present

## 2021-04-22 DIAGNOSIS — Z87891 Personal history of nicotine dependence: Secondary | ICD-10-CM | POA: Diagnosis not present

## 2021-04-22 DIAGNOSIS — G40909 Epilepsy, unspecified, not intractable, without status epilepticus: Principal | ICD-10-CM | POA: Diagnosis present

## 2021-04-22 DIAGNOSIS — G9349 Other encephalopathy: Secondary | ICD-10-CM | POA: Diagnosis present

## 2021-04-22 DIAGNOSIS — I5043 Acute on chronic combined systolic (congestive) and diastolic (congestive) heart failure: Secondary | ICD-10-CM | POA: Diagnosis present

## 2021-04-22 DIAGNOSIS — I10 Essential (primary) hypertension: Secondary | ICD-10-CM | POA: Diagnosis present

## 2021-04-22 DIAGNOSIS — I639 Cerebral infarction, unspecified: Secondary | ICD-10-CM | POA: Diagnosis present

## 2021-04-22 DIAGNOSIS — I509 Heart failure, unspecified: Secondary | ICD-10-CM

## 2021-04-22 DIAGNOSIS — I5023 Acute on chronic systolic (congestive) heart failure: Secondary | ICD-10-CM | POA: Diagnosis not present

## 2021-04-22 DIAGNOSIS — Z7901 Long term (current) use of anticoagulants: Secondary | ICD-10-CM | POA: Diagnosis not present

## 2021-04-22 DIAGNOSIS — R4182 Altered mental status, unspecified: Secondary | ICD-10-CM | POA: Diagnosis not present

## 2021-04-22 DIAGNOSIS — G9389 Other specified disorders of brain: Secondary | ICD-10-CM | POA: Diagnosis present

## 2021-04-22 LAB — RESP PANEL BY RT-PCR (FLU A&B, COVID) ARPGX2
Influenza A by PCR: NEGATIVE
Influenza B by PCR: NEGATIVE
SARS Coronavirus 2 by RT PCR: NEGATIVE

## 2021-04-22 LAB — LIPID PANEL
Cholesterol: 103 mg/dL (ref 0–200)
HDL: 37 mg/dL — ABNORMAL LOW (ref >40)
LDL Cholesterol: 54 mg/dL (ref 0–99)
Total CHOL/HDL Ratio: 2.8 RATIO
Triglycerides: 60 mg/dL (ref <150)
VLDL: 12 mg/dL (ref 0–40)

## 2021-04-22 LAB — URINALYSIS, COMPLETE (UACMP) WITH MICROSCOPIC
Bilirubin Urine: NEGATIVE
Glucose, UA: NEGATIVE mg/dL
Ketones, ur: 20 mg/dL — AB
Nitrite: POSITIVE — AB
Protein, ur: NEGATIVE mg/dL
Specific Gravity, Urine: 1.04 — ABNORMAL HIGH (ref 1.005–1.030)
pH: 6 (ref 5.0–8.0)

## 2021-04-22 LAB — VALPROIC ACID LEVEL: Valproic Acid Lvl: 47 ug/mL — ABNORMAL LOW (ref 50.0–100.0)

## 2021-04-22 LAB — HEMOGLOBIN A1C
Hgb A1c MFr Bld: 5.9 % — ABNORMAL HIGH (ref 4.8–5.6)
Mean Plasma Glucose: 122.63 mg/dL

## 2021-04-22 MED ORDER — SODIUM CHLORIDE 0.9 % IV SOLN
200.0000 mg | Freq: Two times a day (BID) | INTRAVENOUS | Status: DC
  Administered 2021-04-22: 200 mg via INTRAVENOUS
  Filled 2021-04-22 (×4): qty 20

## 2021-04-22 MED ORDER — FUROSEMIDE 10 MG/ML IJ SOLN
20.0000 mg | Freq: Once | INTRAMUSCULAR | Status: AC
  Administered 2021-04-22: 20 mg via INTRAVENOUS
  Filled 2021-04-22: qty 4

## 2021-04-22 MED ORDER — ATORVASTATIN CALCIUM 80 MG PO TABS
80.0000 mg | ORAL_TABLET | Freq: Every day | ORAL | Status: DC
  Administered 2021-04-23 – 2021-04-26 (×4): 80 mg via ORAL
  Filled 2021-04-22 (×4): qty 1

## 2021-04-22 MED ORDER — VALPROATE SODIUM 100 MG/ML IV SOLN
500.0000 mg | Freq: Three times a day (TID) | INTRAVENOUS | Status: DC
  Administered 2021-04-23 (×2): 500 mg via INTRAVENOUS
  Filled 2021-04-22 (×3): qty 5

## 2021-04-22 MED ORDER — SODIUM CHLORIDE 0.9 % IV SOLN
1.0000 g | INTRAVENOUS | Status: DC
  Administered 2021-04-23 – 2021-04-24 (×3): 1 g via INTRAVENOUS
  Filled 2021-04-22 (×3): qty 10

## 2021-04-22 MED ORDER — METOPROLOL TARTRATE 5 MG/5ML IV SOLN
2.5000 mg | INTRAVENOUS | Status: DC | PRN
  Filled 2021-04-22: qty 5

## 2021-04-22 MED ORDER — SODIUM CHLORIDE 0.9 % IV SOLN
200.0000 mg | Freq: Two times a day (BID) | INTRAVENOUS | Status: DC
  Administered 2021-04-23 (×2): 200 mg via INTRAVENOUS
  Filled 2021-04-22 (×3): qty 20

## 2021-04-22 MED ORDER — VALPROATE SODIUM 100 MG/ML IV SOLN
250.0000 mg | Freq: Two times a day (BID) | INTRAVENOUS | Status: DC
  Filled 2021-04-22 (×2): qty 2.5

## 2021-04-22 MED ORDER — ACETAMINOPHEN 650 MG RE SUPP: 650.0000 mg | Freq: Four times a day (QID) | RECTAL | Status: DC | PRN

## 2021-04-22 MED ORDER — CARVEDILOL 6.25 MG PO TABS
6.2500 mg | ORAL_TABLET | Freq: Two times a day (BID) | ORAL | Status: DC
  Administered 2021-04-23 – 2021-04-26 (×7): 6.25 mg via ORAL
  Filled 2021-04-22 (×7): qty 1

## 2021-04-22 MED ORDER — ACETAMINOPHEN 325 MG PO TABS: 650.0000 mg | ORAL_TABLET | Freq: Four times a day (QID) | ORAL | Status: DC | PRN

## 2021-04-22 MED ORDER — SODIUM CHLORIDE 0.9 % IV SOLN: 200.0000 mg | Freq: Two times a day (BID) | INTRAVENOUS | Status: DC

## 2021-04-22 MED ORDER — LEVETIRACETAM IN NACL 500 MG/100ML IV SOLN
500.0000 mg | Freq: Two times a day (BID) | INTRAVENOUS | Status: DC
  Filled 2021-04-22 (×2): qty 100

## 2021-04-22 MED ORDER — VALPROATE SODIUM 100 MG/ML IV SOLN: 250.0000 mg | Freq: Two times a day (BID) | INTRAVENOUS | Status: DC

## 2021-04-22 MED ORDER — LEVETIRACETAM IN NACL 500 MG/100ML IV SOLN: 500.0000 mg | Freq: Two times a day (BID) | INTRAVENOUS | Status: DC

## 2021-04-22 MED ORDER — RIVAROXABAN 20 MG PO TABS
20.0000 mg | ORAL_TABLET | Freq: Every day | ORAL | Status: DC
  Administered 2021-04-23 – 2021-04-26 (×4): 20 mg via ORAL
  Filled 2021-04-22 (×4): qty 1

## 2021-04-22 MED ORDER — VALPROATE SODIUM 100 MG/ML IV SOLN
1500.0000 mg | Freq: Once | INTRAVENOUS | Status: AC
  Administered 2021-04-22: 1500 mg via INTRAVENOUS
  Filled 2021-04-22 (×2): qty 15

## 2021-04-22 MED ORDER — LEVETIRACETAM IN NACL 1500 MG/100ML IV SOLN
1500.0000 mg | Freq: Two times a day (BID) | INTRAVENOUS | Status: DC
  Administered 2021-04-22 – 2021-04-23 (×2): 1500 mg via INTRAVENOUS
  Filled 2021-04-22 (×2): qty 100

## 2021-04-22 NOTE — Procedures (Signed)
Patient Name: Corey Meyers  MRN: [id number]  Epilepsy Attending: [name]  Referring Physician/Provider: Dr Lindajo Royal Date: 04/22/2021  Duration: 23.13 mins  Patient history: 70 year old male with history of left parietal lobe and caudate infarcts who presented with confusion and expressive aphasia.  EEG done for seizures.  Level of alertness: Awake, asleep  AEDs during EEG study: Keppra, Depakote  Technical aspects: This EEG study was done with scalp electrodes positioned according to the 10-20 International system of electrode placement. Electrical activity was acquired at a sampling rate of 500Hz  and reviewed with a high frequency filter of 70Hz  and a low frequency filter of 1Hz . EEG data were recorded continuously and digitally stored.   Description: The posterior dominant rhythm consists of 10 Hz activity of moderate voltage (25-35 uV) seen predominantly in posterior head regions, symmetric and reactive to eye opening and eye closing. Sleep was characterized by vertex waves, sleep spindles (12 to 14 Hz), maximal frontocentral region.  Abundant spikes were noted in left anterior temporal region, maximal F7/T7, quasiperiodic. One seizure without clinical signs was also noted at 1249 arising from left anterior temporal region during which initial EEG showed sharply contoured 4 to 5 Hz theta slowing which gradually evolved into 2 to 3 Hz delta slowing and involved all of left hemisphere.  Seizure lasted for about 1 minute 10 seconds. EEG also showed continuous 3-5 Hz theta-delta slowing in left anterior temporal region, which at times appeared rhythmic.  Hyperventilation and photic stimulation were not performed.     ABNORMALITY -Seizure without clinical sign, left anterior temporal region.  -Spike, left anterior temporal region -Continuous slow, left anterior temporal region  IMPRESSION: This study showed one seizure without clinical signs on 1249, arising from left anterior temporal  region, lasting about 1 minute 10 seconds.  Additionally there is evidence of epileptogenicity as well as cortical dysfunction arising from left anterior temporal region suggestive of underlying structural abnormality/stroke with high potential for seizure recurrence.   Mylan Schwarz 

## 2021-04-22 NOTE — Progress Notes (Signed)
Same-day follow-up note  Routine EEG shows seizure without clinical signs from the left anterior temporal region.  There are left anterior temporal spikes along with continuous slowing in the left anterior temporal region. This is suggestive of epileptogenicity/cortical dysfunction from left anterior temporal region and due to the structural left-sided lesions, there is high potential for seizure recurrence.  Updated recommendations: - Add Vimpat to his regimen of AEDs. Vimpat 200 twice daily (Will avoid phenytoin due to him being on DOAC). - Transfer to [facility] ASAP for continuous EEG and continuing neurological consultation. Dr. Sherryll Burger ordering transfer. - Spoke with the [facility] at [facility] no beds available but requested that this patient be on top of the list for transfer, and I was assured that that would be the case. - Discussed my plan with Dr. Sherryll Burger at [facility] regional My team at [facility] is aware and will consult once the patient reaches.  -- Milon Dikes, MD Neurologist [facility] Pager: [phone]

## 2021-04-22 NOTE — Progress Notes (Signed)
OT Cancellation Note  Patient Details Name: Corey Meyers MRN: [id number] DOB: 1951-08-18   Cancelled Treatment:    Reason Eval/Treat Not Completed: Medical issues which prohibited therapy. Order received, chart reviewed. Pt is transferring imminently to [facility]. Will sign off.  Latina Craver 04/22/2021, 1:50 PM

## 2021-04-22 NOTE — Progress Notes (Signed)
LTM EEG hooked up and running - no initial skin breakdown - push button tested - neuro notified. [facility] monitoring.  

## 2021-04-22 NOTE — Progress Notes (Addendum)
SLP Cancellation Note  Patient Details Name: Corey Meyers MRN: [id number] DOB: July 19, 1951   Cancelled treatment:       Reason Eval/Treat Not Completed: Medical issues which prohibited therapy;Patient not medically ready (chart reviewed; consulted NSG/MD). Pt completing EEG. Per discussion w/ NSG then MD, pt is presenting w/ significantly declined Cognitive-communication skills and presentation at this time impacting his comprehension and follow-through w/ simple commands and communication; overall awareness is decreased. Communication skills are declined at Baseline per family/report. This can impact safety w/ any oral intake. Pt is on IV meds per NSG. Post discussion w/ NSG/MD, pt is at a high risk for aspiration and is recommended to remain NPO today. Recommend frequent oral care for hygiene and stimulation of swallowing. ST services will f/u w/ pt's status tomorrow for BSE, and cognitive-linguistic evaluation when appropriate. Noted Neurology note/assessment results today. MD agreed.        Jerilynn Som, MS, CCC-SLP Speech Language Pathologist Rehab [facility] [phone] 04/22/2021, 1:10 PM

## 2021-04-22 NOTE — H&P (Signed)
History and Physical    Corey PoleRoger D Gavina VHQ:[id number] DOB: 1950/12/19 DOA: 04/22/2021  PCP: Lauro RegulusAnderson, Marshall W, MD Patient coming from: [place]  Chief Complaint: [facility]  HPI: Corey Meyers is a 70 y.o. male with medical history significant of hypertension, hyperlipidemia, prior stroke with residual right-sided weakness and seizures on Keppra and Xarelto, chronic combined congestive heart failure, CAD presented to [facility] ED yesterday with expressive aphasia.  Out of tPA window.  Blood work was mostly unremarkable with WBC 10.9, valproic acid low at 22.  CT head negative for acute infarct.  CTA without LVO.  MRI was negative for acute stroke.  Neurology was consulted and felt that the patient's presentation was due to seizure either nonconvulsive status epilepticus versus prolonged postictal lethargy/aphasia.  EEG was suggestive of epilepsy/cortical dysfunction from left anterior temporal region.  Given very high potential for seizure recurrence, [facility].  Neurology added Vimpat 200 mg twice daily to his antiepileptic drug regimen.  No history could be obtained from the patient due to his aphasia.  Review of Systems:  All systems reviewed and apart from history of presenting illness, are negative.  Past Medical History:  Diagnosis Date   Hyperlipidemia    Hypertension    Stroke [facility](HCC)     Past Surgical History:  Procedure Laterality Date   IR CT HEAD LTD  12/21/2018   IR PERCUTANEOUS ART THROMBECTOMY/INFUSION INTRACRANIAL INC DIAG ANGIO  12/21/2018   LAMINECTOMY     RADIOLOGY WITH ANESTHESIA N/A 12/21/2018   Procedure: RADIOLOGY WITH ANESTHESIA;  Surgeon: Julieanne Cottoneveshwar, Sanjeev, MD;  Location: [facility] OR;  Service: Radiology;  Laterality: N/A;   TONSILLECTOMY       reports that he has quit smoking. He has never used smokeless tobacco. He reports current alcohol use. No history on file for drug use.  No  Known Allergies  History reviewed. No pertinent family history.  Prior to Admission medications   Medication Sig Start Date End Date Taking? Authorizing Provider  aspirin-sod bicarb-citric acid (ALKA-SELTZER) 325 MG TBEF tablet Take 325 mg by mouth every 6 (six) hours as needed (indigestion).   Yes [provider]  atorvastatin (LIPITOR) 80 MG tablet Take 80 mg by mouth daily.   Yes [provider]  carvedilol (COREG) 6.25 MG tablet Take 6.25 mg by mouth 2 (two) times daily with a meal.   Yes [provider]  divalproex (DEPAKOTE ER) 250 MG 24 hr tablet Take 250 mg by mouth 2 (two) times daily.   Yes [provider]  Multiple Vitamin (MULTIVITAMIN WITH MINERALS) TABS tablet Take 1 tablet by mouth daily.   Yes [provider]  rivaroxaban (XARELTO) 20 MG TABS tablet Take 20 mg by mouth daily.   Yes [provider]  lacosamide 200 mg in sodium chloride 0.9 % 25 mL Inject 200 mg into the vein every 12 (twelve) hours. Patient not taking: No sig reported 04/22/21   Delfino LovettShah, Vipul, MD  levETIRAcetam (KEPPRA) 500 MG tablet Take 500 mg by mouth 2 (two) times daily.    [provider]  levETIRAcetam (KEPRRA) 500 MG/100ML SOLN Inject 100 mLs (500 mg total) into the vein every 12 (twelve) hours. Patient not taking: No sig reported 04/22/21   Delfino LovettShah, Vipul, MD  valproate 250 mg in dextrose 5 % 50 mL Inject 250 mg into the vein every 12 (twelve) hours. Patient not taking: No sig reported 04/22/21  Delfino Lovett, MD    Physical Exam: Vitals:   04/22/21 2027  BP: 118/67  Pulse: 80  Resp: 16  Temp: 98.1 F (36.7 C)  TempSrc: Oral  SpO2: 96%    Physical Exam Constitutional:      General: He is not in acute distress. HENT:     Head: Normocephalic and atraumatic.  Eyes:     Conjunctiva/sclera: Conjunctivae normal.  Cardiovascular:     Rate and Rhythm: Normal rate and regular rhythm.     Pulses: Normal pulses.  Pulmonary:     Effort:  Pulmonary effort is normal. No respiratory distress.     Breath sounds: No wheezing.  Abdominal:     General: Bowel sounds are normal. There is no distension.     Palpations: Abdomen is soft.     Tenderness: There is no abdominal tenderness.  Musculoskeletal:        General: No swelling or tenderness.     Cervical back: Normal range of motion and neck supple.  Skin:    General: Skin is warm and dry.  Neurological:     Comments: Somnolent but arousable Unable to give any history due to aphasia Not following commands     Labs on Admission: I have personally reviewed following labs and imaging studies  CBC: Recent Labs  Lab 04/21/21 1249  WBC 10.9*  NEUTROABS 9.6*  HGB 15.0  HCT 42.1  MCV 87.2  PLT 321   Basic Metabolic Panel: Recent Labs  Lab 04/21/21 1249  NA 138  K 3.9  CL 101  CO2 31  GLUCOSE 122*  BUN 15  CREATININE 0.92  CALCIUM 9.1   GFR: CrCl cannot be calculated (Unknown ideal weight.). Liver Function Tests: Recent Labs  Lab 04/21/21 1249  AST 25  ALT 19  ALKPHOS 67  BILITOT 0.6  PROT 8.3*  ALBUMIN 4.0   No results for input(s): LIPASE, AMYLASE in the last 168 hours. No results for input(s): AMMONIA in the last 168 hours. Coagulation Profile: Recent Labs  Lab 04/21/21 1249  INR 1.2   Cardiac Enzymes: No results for input(s): CKTOTAL, CKMB, CKMBINDEX, TROPONINI in the last 168 hours. BNP (last 3 results) No results for input(s): PROBNP in the last 8760 hours. HbA1C: Recent Labs    04/22/21 0625  HGBA1C 5.9*   CBG: Recent Labs  Lab 04/21/21 1248  GLUCAP 121*   Lipid Profile: Recent Labs    04/22/21 0625  CHOL 103  HDL 37*  LDLCALC 54  TRIG 60  CHOLHDL 2.8   Thyroid Function Tests: No results for input(s): TSH, T4TOTAL, FREET4, T3FREE, THYROIDAB in the last 72 hours. Anemia Panel: No results for input(s): VITAMINB12, FOLATE, FERRITIN, TIBC, IRON, RETICCTPCT in the last 72 hours. Urine analysis:    Component Value  Date/Time   COLORURINE YELLOW (A) 04/22/2021 1515   APPEARANCEUR HAZY (A) 04/22/2021 1515   LABSPEC 1.040 (H) 04/22/2021 1515   PHURINE 6.0 04/22/2021 1515   GLUCOSEU NEGATIVE 04/22/2021 1515   HGBUR SMALL (A) 04/22/2021 1515   BILIRUBINUR NEGATIVE 04/22/2021 1515   KETONESUR 20 (A) 04/22/2021 1515   PROTEINUR NEGATIVE 04/22/2021 1515   NITRITE POSITIVE (A) 04/22/2021 1515   LEUKOCYTESUR TRACE (A) 04/22/2021 1515    Radiological Exams on Admission: CT ANGIO HEAD NECK W WO CM  Result Date: 04/21/2021 CLINICAL DATA:  Neuro deficit, acute, stroke suspected EXAM: CT ANGIOGRAPHY HEAD AND NECK TECHNIQUE: Multidetector CT imaging of the head and neck was performed using the standard protocol during  bolus administration of intravenous contrast. Multiplanar CT image reconstructions and MIPs were obtained to evaluate the vascular anatomy. Carotid stenosis measurements (when applicable) are obtained utilizing NASCET criteria, using the distal internal carotid diameter as the denominator. CONTRAST:  75mL OMNIPAQUE IOHEXOL 350 MG/ML SOLN COMPARISON:  None. FINDINGS: CTA NECK FINDINGS Motion limited evaluation.  Within this limitation: Aortic arch: Great vessel origins are patent. Right carotid system: No evidence of dissection, stenosis (50% or greater) or occlusion. Left carotid system: No evidence of dissection, stenosis (50% or greater) or occlusion. There is a shelf-like filling defect along the posterior aspect of the left ICA origin, compatible with a carotid web (see series 8, images 39 through 142) Vertebral arteries: Codominant. No evidence of dissection, stenosis (50% or greater) or occlusion. Skeleton: Moderate multilevel degenerative change. Flowing anterior osteophytes at multiple levels throughout the cervical and imaged upper thoracic spine, suggestive of diffuse idiopathic skeletal hyperostosis. Other neck: No acute findings. Upper chest: Visualized lung apices are clear. Review of the MIP images  confirms the above findings CTA HEAD FINDINGS Anterior circulation: Bilateral intracranial ICAs, MCAs, and ACAs are patent without proximal hemodynamically significant stenosis. Calcific atherosclerosis of bilateral ICAs without greater than 50% stenosis. Posterior circulation: Bilateral intradural vertebral arteries, basilar artery, and posterior cerebral arteries are patent without proximal hemodynamically significant stenosis. Venous sinuses: As permitted by contrast timing, patent. Review of the MIP images confirms the above findings IMPRESSION: CTA head: No large vessel occlusion or proximal hemodynamically significant stenosis. CTA neck: 1. No evidence of significant (greater than 50%) stenosis on this motion limited exam. 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 This finding has been reported to increase the risk of ischemic stroke. 3. Elongated styloid processes bilaterally extending to the hyoid bone, which can be seen with Eagle syndrome in the correct clinical setting. Electronically Signed   By: Feliberto Harts MD   On: 04/21/2021 19:09   DG Chest 2 View  Result Date: 04/22/2021 CLINICAL DATA:  Cough, former smoker EXAM: CHEST - 2 VIEW COMPARISON:  05/15/2020 FINDINGS: Cardiomegaly. Mild, diffuse bilateral interstitial pulmonary opacity. Disc degenerative disease of the thoracic spine. IMPRESSION: Cardiomegaly with mild, diffuse bilateral interstitial pulmonary opacity, likely edema. No focal airspace opacity. Electronically Signed   By: Lauralyn Primes M.D.   On: 04/22/2021 09:11   CT HEAD WO CONTRAST  Result Date: 04/21/2021 CLINICAL DATA:  Altered. EXAM: CT HEAD WITHOUT CONTRAST TECHNIQUE: Contiguous axial images were obtained from the base of the skull through the vertex without intravenous contrast. COMPARISON:  05/15/2020. FINDINGS: Brain: No evidence of acute large vascular territory infarction, hemorrhage, hydrocephalus, extra-axial collection or mass lesion/mass effect. Remote infarct in the  left parietal lobe. Small remote infarct in the anterior left basal ganglia. Associated ex vacuo ventricular dilation. Vascular: No hyperdense vessel identified. Calcific atherosclerosis. Skull: No acute fracture. Sinuses/Orbits: Moderate scattered ethmoid air cell and bilateral frontoethmoidal recess mucosal thickening. No acute findings in the visualized orbits. Other: No mastoid effusions. IMPRESSION: 1. No evidence of acute intracranial abnormality. 2. Remote infarcts in the left parietal lobe and left basal ganglia. 3. Moderate scattered ethmoid air cell and bilateral frontoethmoidal recess mucosal thickening. Electronically Signed   By: Feliberto Harts MD   On: 04/21/2021 15:15   MR BRAIN WO CONTRAST  Result Date: 04/22/2021 CLINICAL DATA:  Right-sided weakness. EXAM: MRI HEAD WITHOUT CONTRAST TECHNIQUE: Multiplanar, multiecho pulse sequences of the brain and surrounding structures were obtained without intravenous contrast. COMPARISON:  05/17/2020 FINDINGS: Brain: No acute infarction, hemorrhage,  hydrocephalus, extra-axial collection or mass lesion. Moderate size remote left parietal infarct affecting cortex and white matter. Chronic lacunar infarct at the left caudate head. Normal brain volume. Vascular: Normal flow voids. Skull and upper cervical spine: Normal marrow signal. Sinuses/Orbits: Mucosal thickening in the bilateral paranasal sinuses, chronic and mildly improved from 2021. Other: Motion degraded IMPRESSION: 1. Motion degraded brain MRI without acute finding or change from 2021. 2. Remote left parietal and caudate infarcts. Electronically Signed   By: Marnee Spring M.D.   On: 04/22/2021 04:38   EEG adult  Result Date: 04/22/2021 [name], MD     04/22/2021  1:16 PM Patient Name: Corey [facility] MRN: [id number] Epilepsy Attending: [name] Referring Physician/Provider: Dr Lindajo Royal Date: 04/22/2021 Duration: 23.13 mins Patient history: 70 year old male with history of left  parietal lobe and caudate infarcts who presented with confusion and expressive aphasia.  EEG done for seizures. Level of alertness: Awake, asleep AEDs during EEG study: Keppra, Depakote Technical aspects: This EEG study was done with scalp electrodes positioned according to the 10-20 International system of electrode placement. Electrical activity was acquired at a sampling rate of 500Hz  and reviewed with a high frequency filter of 70Hz  and a low frequency filter of 1Hz . EEG data were recorded continuously and digitally stored. Description: The posterior dominant rhythm consists of 10 Hz activity of moderate voltage (25-35 uV) seen predominantly in posterior head regions, symmetric and reactive to eye opening and eye closing. Sleep was characterized by vertex waves, sleep spindles (12 to 14 Hz), maximal frontocentral region.  Abundant spikes were noted in left anterior temporal region, maximal F7/T7, quasiperiodic. One seizure without clinical signs was also noted at 1249 arising from left anterior temporal region during which initial EEG showed sharply contoured 4 to 5 Hz theta slowing which gradually evolved into 2 to 3 Hz delta slowing and involved all of left hemisphere.  Seizure lasted for about 1 minute 10 seconds. EEG also showed continuous 3-5 Hz theta-delta slowing in left anterior temporal region, which at times appeared rhythmic.  Hyperventilation and photic stimulation were not performed.   ABNORMALITY -Seizure without clinical sign, left anterior temporal region. -Spike, left anterior temporal region -Continuous slow, left anterior temporal region IMPRESSION: This study showed one seizure without clinical signs on 1249, arising from left anterior temporal region, lasting about 1 minute 10 seconds.  Additionally there is evidence of epileptogenicity as well as cortical dysfunction arising from left anterior temporal region suggestive of underlying structural abnormality/stroke with high potential for  seizure recurrence. Priyanka    EKG: Independently reviewed.  Sinus rhythm, no significant change since prior tracing.  Assessment/Plan Principal Problem:   Seizure [facility]) Active Problems:   Essential hypertension   CVA (cerebral vascular accident) (HCC)   CHF exacerbation (HCC)   UTI (urinary tract infection)   Seizure disorder EEG was suggestive of epilepsy/cortical dysfunction from left anterior temporal region.  Given very high potential for seizure recurrence, patient was transferred to [facility] for continuous EEG. -Neurology consulted and continuous EEG monitoring started.  Follow seizure precautions.  His Depakote level was low and was loaded with 1500 mg, neurology recommending continuing at 500 mg 3 times daily for now.  Recommending Vimpat 200 mg twice daily and Keppra 1500 mg twice daily.  Possible UTI UA done at [facility] today showing positive nitrite, trace leukocytes, 21-50 WBCs, and few bacteria.  No fever, significant leukocytosis, or signs of sepsis. -Ceftriaxone, urine culture  Acute on  chronic combined CHF Chest x-ray done at [facility] today showing cardiomegaly with mild, diffuse bilateral interstitial pulmonary opacity, likely edema.  No focal airspace opacity.  Pneumonia less likely given no fever or significant leukocytosis.  Echo done 05/16/2020 showing EF 35 to 40% and grade 2 diastolic dysfunction.  No hypoxia or signs of respiratory distress.  He is not on a diuretic at home. -IV Lasix 20 mg x 1.  Check BNP level.  Repeat echocardiogram.  Strict I&O's, daily weights.  Hypertension -Stable.  Resume Coreg.  History of CVA -Resume Xarelto and Lipitor.  DVT prophylaxis: Xarelto Code Status: Full code Family Communication: No family available at this time. Disposition Plan: Status is: Inpatient  Remains inpatient appropriate because:Ongoing diagnostic testing needed not appropriate for outpatient work up and Inpatient level of care appropriate  due to severity of illness  Dispo: The patient is from: Home              Anticipated d/c is to: Home              Patient currently is not medically stable to d/c.   Difficult to place patient No  Level of care: Telemetry  The medical decision making on this patient was of high complexity and the patient is at high risk for clinical deterioration, therefore this is a level 3 visit.  John Giovanni MD [facility]  If 7PM-7AM, please contact night-coverage [email or web address]  04/22/2021, 10:44 PM

## 2021-04-22 NOTE — Progress Notes (Signed)
Patient was seen by Dr. Jerrell Belfast earlier today.  Please see his note for details.  As of right now, he is more aphasic than what was described by Dr. Jerrell Belfast earlier, unable to follow even simple commands though he does say " hi" when I initially greet him.  He is unable to tell me his name, and has no other verbal output during my encounter.  He moves both arms relatively symmetrically, though he does not cooperate with formal testing.  He blinks to threat bilaterally.  I am concerned that he is continuing to have ongoing seizure activity contributing to his aphasia and he is being connected to a stat EEG.  His Depakote level was low, and he was loaded with 1500 mg, I will repeat this now and continue at 500mg  TID for now, though will need to avoid overshooting. He has already been started on Vimpat and I will continue this.  Also will continue 1500 twice daily of Keppra.  , MD [facility] [phone]  If 7pm- 7am, please page neurology on call as listed in AMION.

## 2021-04-22 NOTE — Discharge Summary (Addendum)
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 Hyperlipidemia   . Hypertension   . Stroke [facility])    HOSPITAL COURSE:  70 y.o. male with medical history significant for Systolic heart failure, CAD, HTN, prior stroke  with residual right-sided weakness and seizure, on Keppra and Xarelto, who presents to the ED with expressive aphasia.  Patient is seen by neurology.  MRI is negative for acute stroke.  EEG suggestive of epilepsy/cortical dysfunction from left anterior temporal region.  Very high potential for seizure recurrence per neurology  Neurology recommending transfer to [facility], ASAP for continuous EEG.  For time being continue Vimpat 200 mg twice daily per neurology.  I have discussed the case with Dr. Butler Denmark from [facility] who is the accepting physician.  Dr. Jerrell Belfast has updated his colleagues from neurology at [facility] and they are aware of consultation once patient is there.   DISCHARGE CONDITIONS:  Fair CONSULTS OBTAINED:   DRUG ALLERGIES:  No Known Allergies DISCHARGE MEDICATIONS:   Allergies as of 04/22/2021   No Known Allergies      Medication List     STOP taking these medications    atorvastatin 80 MG tablet Commonly known as: LIPITOR   carvedilol 6.25 MG tablet Commonly known as: COREG   divalproex 250 MG 24 hr  tablet Commonly known as: DEPAKOTE ER   feeding supplement Liqd   levETIRAcetam 500 MG tablet Commonly known as: KEPPRA   multivitamin with minerals Tabs tablet   nitroGLYCERIN 0.4 MG SL tablet Commonly known as: NITROSTAT   Xarelto 20 MG Tabs tablet Generic drug: rivaroxaban       TAKE these medications    lacosamide 200 mg in sodium chloride 0.9 % 25 mL Inject 200 mg into the vein every 12 (twelve) hours.   levETIRAcetam 500 MG/100ML Soln Commonly known as: KEPRRA Inject 100 mLs (500 mg total) into the vein every 12 (twelve) hours.   valproate 250 mg in dextrose 5 % 50 mL Inject 250 mg into the vein every 12 (twelve) hours.       DISCHARGE INSTRUCTIONS:   DIET:  N.p.o. for now as he is not cleared from speech therapy due to his mental status.   DISCHARGE CONDITION:  Critical ACTIVITY:  Bedrest OXYGEN:  Home Oxygen: No.  Oxygen Delivery: room air DISCHARGE LOCATION:  [facility]  If you experience worsening of your admission symptoms, develop shortness of breath, life threatening emergency, suicidal or homicidal thoughts you must seek medical attention immediately by calling 911 or calling your MD immediately  if symptoms less severe.  You Must read complete instructions/literature along with all the possible adverse reactions/side effects for all the Medicines you take and that have been prescribed to you. Take any new Medicines after you have completely understood and accpet all  the possible adverse reactions/side effects.   Please note  You were cared for by a hospitalist during your hospital stay. If you have any questions about your discharge medications or the care you received while you were in the hospital after you are discharged, you can call the unit and asked to speak with the hospitalist on call if the hospitalist that took care of you is not available. Once you are discharged, your primary care physician will handle any further medical  issues. Please note that NO REFILLS for any discharge medications will be authorized once you are discharged, as it is imperative that you return to your primary care physician (or establish a relationship with a primary care physician if you do not have one) for your aftercare needs so that they can reassess your need for medications and monitor your lab values.    On the day of Discharge:  VITAL SIGNS:  Blood pressure 108/72, pulse 69, temperature 98.6 F (37 C), temperature source Oral, resp. rate 11, height 6' (1.829 m), SpO2 94 %. PHYSICAL EXAMINATION:  GENERAL:  70 y.o.-year-old patient lying in the bed very confused EYES: Pupils equal, round, reactive to light and accommodation. No scleral icterus. Extraocular muscles intact.  HEENT: Head atraumatic, normocephalic. Oropharynx and nasopharynx clear.  NECK:  Supple, no jugular venous distention. No thyroid enlargement, no tenderness.  LUNGS: Normal breath sounds bilaterally, no wheezing, rales,rhonchi or crepitation. No use of accessory muscles of respiration.  CARDIOVASCULAR: S1, S2 normal. No murmurs, rubs, or gallops.  ABDOMEN: Soft, non-tender, non-distended. Bowel sounds present. No organomegaly or mass.  EXTREMITIES: No pedal edema, cyanosis, or clubbing.  NEUROLOGIC: Awake and alert, nonfocal and moving all his extremities voluntarily but he is very confused and does not follow commands.  He is disoriented.  Has expressive aphasia. PSYCHIATRIC: Normal mood and affect SKIN: No obvious rash, lesion, or ulcer.  DATA REVIEW:   CBC Recent Labs  Lab 04/21/21 1249  WBC 10.9*  HGB 15.0  HCT 42.1  PLT 321    Chemistries  Recent Labs  Lab 04/21/21 1249  NA 138  K 3.9  CL 101  CO2 31  GLUCOSE 122*  BUN 15  CREATININE 0.92  CALCIUM 9.1  AST 25  ALT 19  ALKPHOS 67  BILITOT 0.6    Management plans discussed with the patient, family and they are in agreement.  CODE STATUS: Full Code   TOTAL TIME TAKING CARE OF THIS  PATIENT: 45 minutes.    Delfino Lovett M.D on 04/22/2021 at 3:06 PM  [facility]   CC: Primary care physician; Lauro Regulus, MD   Note: This dictation was prepared with Dragon dictation along with smaller phrase technology. Any transcriptional errors that result from this process are unintentional.

## 2021-04-22 NOTE — ED Notes (Signed)
Attempted call report to [facility] IP RN. This RN was told that the IP RN will call her back in 15 minutes.

## 2021-04-22 NOTE — Evaluation (Signed)
Physical Therapy Evaluation Patient Details Name: Corey Meyers MRN: [id number] DOB: [birth year] Today's Date: 04/22/2021   History of Present Illness  Corey Meyers is a 70 y.o. male with medical history significant for Systolic heart failure, CAD, HTN, prior stroke  with residual right-sided weakness and seizure, on Keppra and Xarelto, who presents to the ED with expressive aphasia with last known normal the night prior.  Apparently wife found him confused, speaking words that did not make sense yet appeared to understand when she gave commands.  He was previously in his usual state of health. MRI negative  Clinical Impression  Patient received sleeping on stretcher with blankets over his head. He rouses to my voice. Speaking gibberish most of the time, on occasion he speaks clearly.  Asked him if he can walk and gestured walking with my fingers and he asked "where do you want me to go?" And proceeded to sit up on the side of the bed. Patient then stood with min guard and ambulated 125 feet in hallway with min assist for steadying. He seemed to understand what I was saying to him while out walking. He will continue to benefit from skilled PT while here to improve functional independence and safety.          Follow Up Recommendations Home health PT    Equipment Recommendations  Other (comment) (TBD)    Recommendations for Other Services       Precautions / Restrictions Precautions Precautions: Fall Restrictions Weight Bearing Restrictions: No      Mobility  Bed Mobility Overal bed mobility: Independent                  Transfers Overall transfer level: Needs assistance   Transfers: Sit to/from Stand Sit to Stand: Supervision            Ambulation/Gait Ambulation/Gait assistance: Min guard Gait Distance (Feet): 125 Feet Assistive device: None;1 person hand held assist Gait Pattern/deviations: Step-through pattern;Drifts right/left Gait velocity: WFL   General Gait  Details: patient is unsteady with mobility requiring min assist for balance.  Stairs            Wheelchair Mobility    Modified Rankin (Stroke Patients Only)       Balance Overall balance assessment: Needs assistance Sitting-balance support: Feet supported Sitting balance-Leahy Scale: Good     Standing balance support: No upper extremity supported;During functional activity Standing balance-Leahy Scale: Fair Standing balance comment: Requires min assist for safety with mobility                             Pertinent Vitals/Pain Pain Assessment: No/denies pain    Home Living Family/patient expects to be discharged to:: Private residence Living Arrangements: Alone;Spouse/significant other Available Help at Discharge: Family;Available PRN/intermittently Type of Home: House Home Access: Stairs to enter   Entrance Stairs-Number of Steps: 2 Home Layout: One level Home Equipment: None Additional Comments: Patient lives alone per ex wife. She checks on him frequently and lives a mile from him. Reports he started driving again recently after initial seizure.    Prior Function Level of Independence: Independent               Hand Dominance   Dominant Hand: Right    Extremity/Trunk Assessment   Upper Extremity Assessment Upper Extremity Assessment: Generalized weakness    Lower Extremity Assessment Lower Extremity Assessment: Generalized weakness    Cervical / Trunk Assessment Cervical /  Trunk Assessment: Normal  Communication   Communication: Expressive difficulties  Cognition Arousal/Alertness: Awake/alert Behavior During Therapy: WFL for tasks assessed/performed Overall Cognitive Status: Difficult to assess                                 General Comments: Patient has expressive apahasia, unable to determine orientation currently.      General Comments      Exercises     Assessment/Plan    PT Assessment Patient  needs continued PT services  PT Problem List Decreased mobility;Decreased activity tolerance;Decreased balance;Decreased safety awareness       PT Treatment Interventions Gait training;Stair training;Functional mobility training;Therapeutic activities;Patient/family education;Balance training;Therapeutic exercise    PT Goals (Current goals can be found in the Care Plan section)  Acute Rehab PT Goals Patient Stated Goal: none PT Goal Formulation: Patient unable to participate in goal setting Time For Goal Achievement: 04/29/21    Frequency Min 2X/week   Barriers to discharge Decreased caregiver support      Co-evaluation               AM-PAC PT "6 Clicks" Mobility  Outcome Measure Help needed turning from your back to your side while in a flat bed without using bedrails?: None Help needed moving from lying on your back to sitting on the side of a flat bed without using bedrails?: None Help needed moving to and from a bed to a chair (including a wheelchair)?: A Little Help needed standing up from a chair using your arms (e.g., wheelchair or bedside chair)?: A Little Help needed to walk in hospital room?: A Little Help needed climbing 3-5 steps with a railing? : A Little 6 Click Score: 20    End of Session   Activity Tolerance: Patient tolerated treatment well;Patient limited by lethargy Patient left: in bed;with call bell/phone within reach;with family/visitor present Nurse Communication: Mobility status PT Visit Diagnosis: Unsteadiness on feet (R26.81);Difficulty in walking, not elsewhere classified (R26.2)    Time: 5400-8676 PT Time Calculation (min) (ACUTE ONLY): 21 min   Charges:   PT Evaluation $PT Eval Moderate Complexity: 1 Mod PT Treatments $Gait Training: 8-22 mins        Lacosta Hargan, PT, GCS 04/22/21,1:49 PM

## 2021-04-22 NOTE — Consult Note (Signed)
Neurology Consultation  Reason for Consult: Altered mental status Referring Physician: Dr. Erma Heritage  CC: Altered mental status, speech difficulties  History is obtained from: Chart review  HPI: Corey Meyers is a 70 y.o. 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 He follows with Dr. Glenna Durand at [facility]. History was provided to the ED provider by his wife who lives Separately.  She reportedly called to check on him yesterday morning and he initially did not pick up the phone which was abnormal.  When he did pick up, he appeared confused and not making any sense.  She came into check on him and found that he was confused. No weakness no facial asymmetry noted. Last known well was sometime on the night of 04/20/2021 when somebody had spoken with him. I do not have anyone available to confirm this history today. All of the history is obtained from chart review There is mention of his speech being good and bad on different days but has been an ongoing issue for a while.   In the ER, he had no focal motor or sensory deficits but his speech was off.  He had dense expressive aphasia with very mild receptive aphasia.  CT head unremarkable for acute process.  CTA head and neck unremarkable for emergent LVO. MRI was completed overnight that did not show any acute stroke.  LKW: Sometime on April 20, 2021 tpa given?: no, outside the window Premorbid modified Rankin scale (mRS):2  ROS: Unable to obtain due to altered mental status.   Past Medical History:  Diagnosis Date   Hyperlipidemia    Hypertension    Stroke [facility])         No family history on file.   Social History:   reports that he has quit smoking. He has never used smokeless  tobacco. He reports current alcohol use. No history on file for drug use.  Medications  Current Facility-Administered Medications:    0.9 %  sodium chloride infusion, , Intravenous, Continuous, Andris Baumann, MD, Last Rate: 75 mL/hr at 04/22/21 0808, Infusion Verify at 04/22/21 8338   acetaminophen (TYLENOL) tablet 650 mg, 650 mg, Oral, Q4H PRN **OR** acetaminophen (TYLENOL) 160 MG/5ML solution 650 mg, 650 mg, Per Tube, Q4H PRN **OR** acetaminophen (TYLENOL) suppository 650 mg, 650 mg, Rectal, Q4H PRN, Andris Baumann, MD   atorvastatin (LIPITOR) tablet 80 mg, 80 mg, Oral, q1800, Andris Baumann, MD   carvedilol (COREG) tablet 6.25 mg, 6.25 mg, Oral, BID, Andris Baumann, MD   levETIRAcetam (KEPPRA) IVPB 500 mg/100 mL premix, 500 mg, Intravenous, Q12H, Milon Dikes, MD   rivaroxaban Carlena Hurl) tablet 20 mg, 20 mg, Oral, Daily, Andris Baumann, MD   valproate (DEPACON) 1,500 mg in dextrose 5 % 50 mL IVPB, 1,500 mg, Intravenous, Once, Milon Dikes, MD   valproate (DEPACON) 750 mg in dextrose 5 % 50 mL IVPB, 750 mg, Intravenous, Q12H, Milon Dikes, MD  Current Outpatient Medications:    atorvastatin (LIPITOR) 80 MG tablet, Take 1 tablet (80 mg total) by mouth daily at 6 PM., Disp: , Rfl:    carvedilol (COREG) 6.25 MG tablet, Take 6.25 mg by mouth 2 (two) times daily., Disp: , Rfl:    divalproex (DEPAKOTE ER) 250 MG 24 hr tablet, Take 3 tablets (750 mg  total) by mouth 2 (two) times daily., Disp: 60 tablet, Rfl: 0   Multiple Vitamin (MULTIVITAMIN WITH MINERALS) TABS tablet, Place 1 tablet into feeding tube daily., Disp: 30 tablet, Rfl: 0   XARELTO 20 MG TABS tablet, Take 20 mg by mouth daily., Disp: , Rfl:    feeding supplement, ENSURE ENLIVE, (ENSURE ENLIVE) LIQD, Take 237 mLs by mouth daily at 3 pm. (Patient not taking: Reported on 05/15/2020), Disp: 237 mL, Rfl: 12   levETIRAcetam (KEPPRA) 500 MG tablet, Take 500 mg by mouth 2 (two) times daily. (Patient not taking: Reported on 04/21/2021), Disp:  , Rfl:    nitroGLYCERIN (NITROSTAT) 0.4 MG SL tablet, Place 0.4 mg under the tongue every 5 (five) minutes as needed for chest pain. , Disp: , Rfl:    Exam: Current vital signs: BP 109/86 (BP Location: Right Arm)   Pulse 77   Temp 98.6 F (37 C) (Oral)   Resp 20   Ht 6' (1.829 m)   SpO2 95%   BMI 33.73 kg/m  Vital signs in last 24 hours: Temp:  [97.6 F (36.4 C)-98.6 F (37 C)] 98.6 F (37 C) (08/04 0510) Pulse Rate:  [72-95] 77 (08/04 0646) Resp:  [16-20] 20 (08/04 0646) BP: (90-122)/(54-86) 109/86 (08/04 0646) SpO2:  [91 %-100 %] 95 % (08/04 0646) General: Patient is awake alert in no distress HEENT: Normocephalic atraumatic Lungs clear Cardiovascular: Regular rate rhythm Extremities warm well perfused Neurologic exam Is awake alert responds yes and okay to all questions. Is able to follow commands-simple commands but not multistep commands. Verbal output is diminished and seems to have expressive aphasia. Repetition and fluency are also impaired. Cranial nerves II to XII intact Motor examination with antigravity in all fours without drift Sensation intact to touch Coordination difficult to assess due to his inability to follow complex commands but no gross dysmetria noted NIH stroke scale 1a Level of Conscious.: 0 1b LOC Questions: 2 1c LOC Commands: 1 2 Best Gaze: 0 3 Visual: 0 4 Facial Palsy: 0 5a Motor Arm - left: 0 5b Motor Arm - Right: 0 6a Motor Leg - Left: 0 6b Motor Leg - Right:0 7 Limb Ataxia: 0 8 Sensory: 0 9 Best Language: 2 10 Dysarthria: 1 11 Extinct. and Inatten.:0  TOTAL: 6    Labs I have reviewed labs in epic and the results pertinent to this consultation are: Valproate level-22  CBC    Component Value Date/Time   WBC 10.9 (H) 04/21/2021 1249   RBC 4.83 04/21/2021 1249   HGB 15.0 04/21/2021 1249   HGB 13.1 02/12/2014 0210   HCT 42.1 04/21/2021 1249   HCT 38.7 (L) 02/12/2014 0210   PLT 321 04/21/2021 1249   PLT 291 02/12/2014  0210   MCV 87.2 04/21/2021 1249   MCV 89 02/12/2014 0210   MCH 31.1 04/21/2021 1249   MCHC 35.6 04/21/2021 1249   RDW 13.0 04/21/2021 1249   RDW 14.4 02/12/2014 0210   LYMPHSABS 1.0 04/21/2021 1249   LYMPHSABS 1.9 02/12/2014 0210   MONOABS 0.3 04/21/2021 1249   MONOABS 1.7 (H) 02/12/2014 0210   EOSABS 0.0 04/21/2021 1249   EOSABS 0.0 02/12/2014 0210   BASOSABS 0.0 04/21/2021 1249   BASOSABS 0.1 02/12/2014 0210    CMP     Component Value Date/Time   NA 138 04/21/2021 1249   NA 135 (L) 02/12/2014 0210   K 3.9 04/21/2021 1249   K 3.5 02/12/2014 0210   CL 101 04/21/2021 1249  CL 102 02/12/2014 0210   CO2 31 04/21/2021 1249   CO2 26 02/12/2014 0210   GLUCOSE 122 (H) 04/21/2021 1249   GLUCOSE 128 (H) 02/12/2014 0210   BUN 15 04/21/2021 1249   BUN 11 02/12/2014 0210   CREATININE 0.92 04/21/2021 1249   CREATININE 0.73 02/12/2014 0210   CALCIUM 9.1 04/21/2021 1249   CALCIUM 8.4 (L) 02/12/2014 0210   PROT 8.3 (H) 04/21/2021 1249   PROT 6.4 02/12/2014 0210   ALBUMIN 4.0 04/21/2021 1249   ALBUMIN 3.0 (L) 02/12/2014 0210   AST 25 04/21/2021 1249   AST 336 (H) 02/12/2014 0210   ALT 19 04/21/2021 1249   ALT 57 02/12/2014 0210   ALKPHOS 67 04/21/2021 1249   ALKPHOS 65 02/12/2014 0210   BILITOT 0.6 04/21/2021 1249   BILITOT 0.5 02/12/2014 0210   GFRNONAA >60 04/21/2021 1249   GFRNONAA >60 02/12/2014 0210   GFRAA >60 05/20/2020 0527   GFRAA >60 02/12/2014 0210    Lipid Panel     Component Value Date/Time   CHOL 103 04/22/2021 0625   CHOL 134 02/12/2014 0210   TRIG 60 04/22/2021 0625   TRIG 122 02/12/2014 0210   HDL 37 (L) 04/22/2021 0625   HDL 36 (L) 02/12/2014 0210   CHOLHDL 2.8 04/22/2021 0625   VLDL 12 04/22/2021 0625   VLDL 24 02/12/2014 0210   LDLCALC 54 04/22/2021 0625   LDLCALC 74 02/12/2014 0210     Imaging I have reviewed the images obtained: MRI brain without contrast with remote left parietal and caudate infarcts with no acute findings or change from  2021.  CT angiography of head and neck-no evidence of significant stenosis on the most limited exam.  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 Imaging negative for stroke-doubt that this is a stroke or TIA given completely unremarkable findings on the MRI scan.  Etiology of current presentation differentials include: Most likely worsening of existing deficits-recrudescence of stroke symptoms in the setting of an underlying infection-need to look for UA chest x-ray etc. versus seizure followed by prolonged postictal state versus ongoing nonconvulsive seizures/status epilepticus.   Impression: Not likely to be a stroke or TIA Most likely seizure-either subclinical status epilepticus versus prolonged postictal lethargy/aphasia. Evaluate for toxic metabolic encephalopathy  Recommendations:  From seizures EEG/postictal aphasia vs nonconvulsive status epilepticus standpoint: -EEG -Given extra load of Keppra-continue Keppra 500 twice daily IV for now as he is still on unable to pass a swallow screen -Valproate level-22.  I have ordered an additional 1500 mg load.  Continue 750 twice daily after that. -Seizure precautions -Continue antiepileptics IV for now-can change to p.o. once he passes a swallow screen, which unfortunately he has not until now per his bedside RN. -Check urinalysis and chest x-ray -If his EEG is concerning for ongoing seizures or status epilepticus, he might need transfer to a tertiary center. -Although his valproate level was low, I would still check ammonia levels  From a stroke prevention standpoint: -No need for repeating the echo-prior echo showed low EF  and has a history of marrow thrombus-is currently on anticoagulation.  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 -Continue high intensity statin  I discussed my plan with Dr. Sherryll Burger  -- Milon Dikes, MD Neurologist [facility] Pager: [phone]

## 2021-04-22 NOTE — Progress Notes (Signed)
Eeg done 

## 2021-04-23 ENCOUNTER — Inpatient Hospital Stay (HOSPITAL_COMMUNITY): Payer: Medicare HMO

## 2021-04-23 DIAGNOSIS — I5023 Acute on chronic systolic (congestive) heart failure: Secondary | ICD-10-CM

## 2021-04-23 DIAGNOSIS — I5021 Acute systolic (congestive) heart failure: Secondary | ICD-10-CM

## 2021-04-23 DIAGNOSIS — G40919 Epilepsy, unspecified, intractable, without status epilepticus: Secondary | ICD-10-CM

## 2021-04-23 DIAGNOSIS — N390 Urinary tract infection, site not specified: Secondary | ICD-10-CM

## 2021-04-23 LAB — BASIC METABOLIC PANEL
Anion gap: 9 (ref 5–15)
BUN: 14 mg/dL (ref 8–23)
CO2: 30 mmol/L (ref 22–32)
Calcium: 8.6 mg/dL — ABNORMAL LOW (ref 8.9–10.3)
Chloride: 102 mmol/L (ref 98–111)
Creatinine, Ser: 0.81 mg/dL (ref 0.61–1.24)
GFR, Estimated: >60 mL/min (ref >60)
Glucose, Bld: 81 mg/dL (ref 70–99)
Potassium: 3.3 mmol/L — ABNORMAL LOW (ref 3.5–5.1)
Sodium: 141 mmol/L (ref 135–145)

## 2021-04-23 LAB — CBC
HCT: 42.6 % (ref 39.0–52.0)
Hemoglobin: 14.3 g/dL (ref 13.0–17.0)
MCH: 29.4 pg (ref 26.0–34.0)
MCHC: 33.6 g/dL (ref 30.0–36.0)
MCV: 87.5 fL (ref 80.0–100.0)
Platelets: 309 10*3/uL (ref 150–400)
RBC: 4.87 MIL/uL (ref 4.22–5.81)
RDW: 13.2 % (ref 11.5–15.5)
WBC: 8.4 10*3/uL (ref 4.0–10.5)
nRBC: 0 % (ref 0.0–0.2)

## 2021-04-23 LAB — ECHOCARDIOGRAM COMPLETE
Area-P 1/2: 3.27 cm2
S' Lateral: 3.6 cm
Weight: 3908.31 oz

## 2021-04-23 LAB — BRAIN NATRIURETIC PEPTIDE: B Natriuretic Peptide: 73.2 pg/mL (ref 0.0–100.0)

## 2021-04-23 MED ORDER — LEVETIRACETAM 750 MG PO TABS
1500.0000 mg | ORAL_TABLET | Freq: Two times a day (BID) | ORAL | Status: DC
  Administered 2021-04-23 – 2021-04-26 (×6): 1500 mg via ORAL
  Filled 2021-04-23 (×6): qty 2

## 2021-04-23 MED ORDER — POTASSIUM CHLORIDE 10 MEQ/100ML IV SOLN
10.0000 meq | INTRAVENOUS | Status: DC
  Administered 2021-04-23: 10 meq via INTRAVENOUS
  Filled 2021-04-23: qty 100

## 2021-04-23 MED ORDER — PERFLUTREN LIPID MICROSPHERE
1.0000 mL | INTRAVENOUS | Status: AC | PRN
  Administered 2021-04-23: 3 mL via INTRAVENOUS
  Filled 2021-04-23: qty 10

## 2021-04-23 MED ORDER — LACOSAMIDE 200 MG PO TABS
200.0000 mg | ORAL_TABLET | Freq: Two times a day (BID) | ORAL | Status: DC
  Administered 2021-04-23 – 2021-04-26 (×6): 200 mg via ORAL
  Filled 2021-04-23 (×6): qty 1

## 2021-04-23 MED ORDER — DIVALPROEX SODIUM 250 MG PO DR TAB
750.0000 mg | DELAYED_RELEASE_TABLET | Freq: Two times a day (BID) | ORAL | Status: DC
  Administered 2021-04-23 – 2021-04-26 (×6): 750 mg via ORAL
  Filled 2021-04-23 (×7): qty 3

## 2021-04-23 MED ORDER — POTASSIUM CHLORIDE CRYS ER 20 MEQ PO TBCR
40.0000 meq | EXTENDED_RELEASE_TABLET | Freq: Once | ORAL | Status: AC
  Administered 2021-04-23: 40 meq via ORAL
  Filled 2021-04-23: qty 2

## 2021-04-23 MED ORDER — POTASSIUM CHLORIDE 10 MEQ/100ML IV SOLN: 10.0000 meq | INTRAVENOUS | Status: DC

## 2021-04-23 MED ORDER — POTASSIUM CHLORIDE 10 MEQ/100ML IV SOLN
10.0000 meq | INTRAVENOUS | Status: AC
  Administered 2021-04-23 (×3): 10 meq via INTRAVENOUS
  Filled 2021-04-23 (×3): qty 100

## 2021-04-23 NOTE — Plan of Care (Signed)
Patient is alert. Pt sometimes responds to commands. Pt will answer questions correctly, intermittently.  No signs or symptoms of seizure activity noted. Bed alarm on. EEG in progress. Seizure precautions in place.    Problem: Education: Goal: Expressions of having a comfortable level of knowledge regarding the disease process will increase Outcome: Progressing   Problem: Coping: Goal: Ability to adjust to condition or change in health will improve Outcome: Progressing Goal: Ability to identify appropriate support needs will improve Outcome: Progressing   Problem: Health Behavior/Discharge Planning: Goal: Compliance with prescribed medication regimen will improve Outcome: Progressing   Problem: Medication: Goal: Risk for medication side effects will decrease Outcome: Progressing   Problem: Clinical Measurements: Goal: Complications related to the disease process, condition or treatment will be avoided or minimized Outcome: Progressing Goal: Diagnostic test results will improve Outcome: Progressing   Problem: Safety: Goal: Verbalization of understanding the information provided will improve Outcome: Progressing   Problem: Self-Concept: Goal: Level of anxiety will decrease Outcome: Progressing Goal: Ability to verbalize feelings about condition will improve Outcome: Progressing

## 2021-04-23 NOTE — Evaluation (Addendum)
Clinical/Bedside Swallow Evaluation Patient Details  Name: Corey Meyers MRN: [id number] Date of Birth: 06-14-1951  Today's Date: 04/23/2021 Time: SLP Start Time (ACUTE ONLY): 1212 SLP Stop Time (ACUTE ONLY): 1225 SLP Time Calculation (min) (ACUTE ONLY): 13 min  Past Medical History:  Past Medical History:  Diagnosis Date   Hyperlipidemia    Hypertension    Stroke [facility])    Past Surgical History:  Past Surgical History:  Procedure Laterality Date   IR CT HEAD LTD  12/21/2018   IR PERCUTANEOUS ART THROMBECTOMY/INFUSION INTRACRANIAL INC DIAG ANGIO  12/21/2018   LAMINECTOMY     RADIOLOGY WITH ANESTHESIA N/A 12/21/2018   Procedure: RADIOLOGY WITH ANESTHESIA;  Surgeon: Julieanne Cotton, MD;  Location: [facility] OR;  Service: Radiology;  Laterality: N/A;   TONSILLECTOMY     HPI:  Corey Meyers is a 70 y.o. male who presented to [facility] ED with expressive aphasia.  Out of tPA window. CT head (04/21/21) negative for acute infarct.    MRI (04/22/21) was negative for acute stroke. C-xray revealed "cardiomegaly with mild, diffuse bilateral interstitial pulmonary opacity, likely edema. No focal airspace opacity". EEG (04/22/21) suggestive of epilepsy/cortical dysfunction from left anterior temporal region. Corey Meyers transferred to [facility] for continuous EEG. Failed [facility] Swallow Screen (04/22/21). PMH: hypertension, hyperlipidemia, prior stroke with residual right-sided weakness and seizures on Keppra and Xarelto, chronic combined congestive heart failure, CAD, BSE (05/20/2020) with functional swallow and recommendation for dys 3, thin liquids (no straws).   Assessment / Plan / Recommendation Clinical Impression  Corey Meyers alert and expressing no desire to consume POs. Suspect poor language comprehension limited his ability to fully understand the purpose of today's eval despite explanation. Oral mechanism examination limited due to decreased command following, however Corey Meyers noted to have adequate dentition, though poor in condition.  Labial and lingual ROM/symmetry WNL. He self fed sips of thin liquids (roughly 3-4oz of consecutive sips), whole cup of puree and x2 graham cracker squares exhibiting no overt s/sx of aspiration. Complete oral clearance of solids noted. Corey Meyers is mildy impulsive, taking large bites and sips, but this was not observed to result in clinical s/sx of airway compromise. Recommend regular/thin liquid diet. No SLP f/u warranted for dysphagia.  SLP Visit Diagnosis: Dysphagia, unspecified (R13.10)    Aspiration Risk  No limitations    Diet Recommendation Regular;Thin liquid   Liquid Administration via: Cup;Straw Medication Administration: Whole meds with liquid Supervision: Patient able to self feed;Intermittent supervision to cue for compensatory strategies Compensations: Slow rate;Small sips/bites Postural Changes: Seated upright at 90 degrees    Other  Recommendations Oral Care Recommendations: Oral care BID;Staff/trained caregiver to provide oral care   Follow up Recommendations Other (comment) (TBD)      Frequency and Duration            Prognosis        Swallow Study   General Date of Onset: 04/22/21 HPI: Corey Meyers is a 70 y.o. male who presented to [facility] ED with expressive aphasia.  Out of tPA window. CT head (04/21/21) negative for acute infarct.    MRI (04/22/21) was negative for acute stroke. C-xray revealed "cardiomegaly with mild, diffuse bilateral interstitial pulmonary opacity, likely edema. No focal airspace opacity". EEG (04/22/21) suggestive of epilepsy/cortical dysfunction from left anterior temporal region. Corey Meyers transferred to [facility] for continuous EEG. Failed [facility] Swallow Screen (04/22/21). PMH: hypertension, hyperlipidemia, prior stroke with residual right-sided weakness and seizures on Keppra and Xarelto, chronic combined congestive heart failure, CAD, BSE (05/20/2020) with functional  swallow and recommendation for dys 3, thin liquids (no straws). Type of Study: Bedside Swallow  Evaluation Previous Swallow Assessment:  (see HPI) Diet Prior to this Study: NPO Temperature Spikes Noted: No Respiratory Status: Room air History of Recent Intubation: No Behavior/Cognition: Alert;Cooperative;Requires cueing Oral Cavity Assessment: Within Functional Limits Oral Care Completed by SLP: No Oral Cavity - Dentition: Adequate natural dentition;Poor condition Vision: Functional for self-feeding Self-Feeding Abilities: Able to feed self Patient Positioning: Upright in bed;Postural control adequate for testing Baseline Vocal Quality: Normal Volitional Cough: Cognitively unable to elicit Volitional Swallow: Able to elicit    Oral/Motor/Sensory Function Overall Oral Motor/Sensory Function: Within functional limits   Ice Chips Ice chips: Not tested   Thin Liquid Thin Liquid: Within functional limits Presentation: Straw;Self Fed    Nectar Thick Nectar Thick Liquid: Not tested   Honey Thick Honey Thick Liquid: Not tested   Puree Puree: Within functional limits Presentation: Spoon;Self Fed   Solid     Solid: Within functional limits Presentation: Self Fed     Avie Echevaria, MA, CCC-SLP Acute Rehabilitation Services Office Number: [phone]  Paulette Blanch 04/23/2021,12:48 PM

## 2021-04-23 NOTE — Progress Notes (Signed)
  Echocardiogram 2D Echocardiogram has been performed.  Delcie Roch 04/23/2021, 3:58 PM

## 2021-04-23 NOTE — Progress Notes (Signed)
Physical Therapy Treatment Patient Details Name: Corey Meyers MRN: [id number] DOB: 07-08-1951 Today's Date: 04/23/2021    History of Present Illness 70 y/o male presented to [facility] ED on 8/3 for expressive aphasia. CT head negative for acute abnormalities. MRI negative for acute abnormalities. EEG showed evidence of epileptogenicity as well as cortical dysfunction arising from L anterior temporal region suggestive of underlying structural abnormality/stroke with high potential for seizure recurrence. 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 Patient recently seen by PT at [facility] with no change in status. Treatment session limited by long term EEG connected. Patient requires supervision for sit to stand transfer and sidesteps at EOB. Patient able to perform standing marching with no UE support and supervision. Anticipate patient to progress quickly once disconnected from EEG. Recommend HHPT at discharge and 24 hour supervision due to cognition and aphasia.     Follow Up Recommendations  Home health PT;Supervision/Assistance - 24 hour     Equipment Recommendations  Other (comment) (TBD)    Recommendations for Other Services       Precautions / Restrictions Precautions Precautions: Fall Precaution Comments: seizure Restrictions Weight Bearing Restrictions: No    Mobility  Bed Mobility Overal bed mobility: Independent                  Transfers Overall transfer level: Needs assistance Equipment used: None Transfers: Sit to/from Stand Sit to Stand: Supervision         General transfer comment: supervision for safety, no physical assistance required  Ambulation/Gait Ambulation/Gait assistance: Supervision Gait Distance (Feet): 2 Feet Assistive device: None       General Gait Details: taking steps at EOB but limited due to long term EEG connected. Supervision for safety, no  LOB noted   Stairs             Wheelchair Mobility    Modified Rankin (Stroke Patients Only)       Balance Overall balance assessment: Needs assistance Sitting-balance support: Feet supported Sitting balance-Leahy Scale: Good     Standing balance support: No upper extremity supported;During functional activity Standing balance-Leahy Scale: Fair                              Cognition Arousal/Alertness: Awake/alert Behavior During Therapy: WFL for tasks assessed/performed Overall Cognitive Status: Impaired/Different from baseline Area of Impairment: Attention;Following commands;Safety/judgement;Awareness                   Current Attention Level: Sustained   Following Commands: Follows one step commands with increased time Safety/Judgement: Decreased awareness of safety;Decreased awareness of deficits Awareness: Emergent   General Comments: Patient with improved ability to verbalize and able to answer some questions appropriately. At times, patient with random remarks not pertaining to conversation. Requires cues to maintain attention. Decreased awareness of safety and deficits.      Exercises General Exercises - Lower Extremity Ankle Circles/Pumps: Both;10 reps;Supine Straight Leg Raises: Both;5 reps;Supine Hip Flexion/Marching: Both;10 reps;Standing    General Comments        Pertinent Vitals/Pain Pain Assessment: Faces Faces Pain Scale: No hurt Pain Intervention(s): Monitored during session    Home Living Family/patient expects to be discharged to:: Private residence Living Arrangements: Alone Available Help at Discharge: Family;Available PRN/intermittently Type of Home: House Home Access: Stairs to enter Entrance Stairs-Rails: None  Home Layout: One level Home Equipment: None Additional Comments: Patient lives alone per ex wife. She checks on him frequently and lives a mile from him. Reports he started driving again recently  after initial seizure.    Prior Function Level of Independence: Independent          PT Goals (current goals can now be found in the care plan section) Acute Rehab PT Goals Patient Stated Goal: did not state PT Goal Formulation: With patient Time For Goal Achievement: 05/07/21 Potential to Achieve Goals: Good    Frequency    Min 3X/week      PT Plan      Co-evaluation              AM-PAC PT "6 Clicks" Mobility   Outcome Measure  Help needed turning from your back to your side while in a flat bed without using bedrails?: None Help needed moving from lying on your back to sitting on the side of a flat bed without using bedrails?: None Help needed moving to and from a bed to a chair (including a wheelchair)?: A Little Help needed standing up from a chair using your arms (e.g., wheelchair or bedside chair)?: A Little Help needed to walk in hospital room?: A Little Help needed climbing 3-5 steps with a railing? : A Little 6 Click Score: 20    End of Session   Activity Tolerance: Patient tolerated treatment well Patient left: in bed;with call bell/phone within reach;with bed alarm set Nurse Communication: Mobility status PT Visit Diagnosis: Unsteadiness on feet (R26.81);Difficulty in walking, not elsewhere classified (R26.2)     Time: 9518-8416 PT Time Calculation (min) (ACUTE ONLY): 27 min  Charges:  $Therapeutic Exercise: 8-22 mins $Therapeutic Activity: 8-22 mins                     Claris Guymon A. Dan Humphreys PT, DPT Acute Rehabilitation Services Pager [phone] Office [phone]    Viviann Spare 04/23/2021, 5:22 PM

## 2021-04-23 NOTE — Procedures (Addendum)
Patient Name: Corey Meyers  MRN: [id number]  Epilepsy Attending: [name]  Referring Physician/Provider: Dr Ritta Slot Duration: 04/22/2021 2120 to 04/23/2021 2120  Patient history: 70 year old male with history of left parietal lobe and caudate infarcts who presented with confusion and expressive aphasia.  EEG done for seizures.   Level of alertness: Awake, asleep   AEDs during EEG study: Keppra, Depakote, Vimpat   Technical aspects: This EEG study was done with scalp electrodes positioned according to the 10-20 International system of electrode placement. Electrical activity was acquired at a sampling rate of 500Hz  and reviewed with a high frequency filter of 70Hz  and a low frequency filter of 1Hz . EEG data were recorded continuously and digitally stored.   Description: The posterior dominant rhythm consists of 10 Hz activity of moderate voltage (25-35 uV) seen predominantly in posterior head regions, symmetric and reactive to eye opening and eye closing. Sleep was characterized by vertex waves, sleep spindles (12 to 14 Hz), maximal frontocentral region. Abundant spikes were noted in left anterior temporal region, maximal F7/T7, quasiperiodic. EEG also showed continuous 3-5 Hz theta-delta slowing in left anterior temporal region, which at times appeared rhythmic.  Hyperventilation and photic stimulation were not performed.      ABNORMALITY -Spike, left anterior temporal region -Continuous slow, left anterior temporal region   IMPRESSION: This study showed evidence of epileptogenicity as well as cortical dysfunction arising from left anterior temporal region suggestive of underlying structural abnormality/stroke with high potential for seizure recurrence.  No definite seizure were seen during the study.   Loc Feinstein 

## 2021-04-23 NOTE — Progress Notes (Signed)
0155 pt bed alarm went off,. Pt noted out of bed. pt stated  "I need to pee" and walked to the bathroom. Pt noted agitated, pt pulled IV out, disconnected EEG  monitor. Pt assisted back to bed. Pt looked up at clock and said in a clear voice, "Is it really 0200?"

## 2021-04-23 NOTE — Progress Notes (Signed)
Subjective: No clinical seizures overnight.  Was able to wake up and answer some questions this morning but still not back to baseline.  ROS: Unable to obtain due to aphasia  Examination  Vital signs in last 24 hours: Temp:  [97.8 F (36.6 C)-98.7 F (37.1 C)] 98.4 F (36.9 C) (08/05 0756) Pulse Rate:  [63-80] 74 (08/05 0756) Resp:  [15-19] 19 (08/05 0756) BP: (95-130)/(53-76) 104/73 (08/05 0756) SpO2:  [93 %-97 %] 94 % (08/05 0756) Weight:  [110.8 kg] 110.8 kg (08/05 0500)  General: lying in bed, NAD CVS: pulse-normal rate and rhythm RS: breathing comfortably Extremities: normal, warm  Neuro: MS: Alert, only oriented to self, able to name 2 out of 3 objects, able to follow simple one-step commands after repetition CN: pupils equal and reactive,  EOMI, face symmetric, tongue midline Motor: Spontaneously moving all 4 extremities antigravity   Basic Metabolic Panel: Recent Labs  Lab 04/21/21 1249 04/23/21 0321  NA 138 141  K 3.9 3.3*  CL 101 102  CO2 31 30  GLUCOSE 122* 81  BUN 15 14  CREATININE 0.92 0.81  CALCIUM 9.1 8.6*    CBC: Recent Labs  Lab 04/21/21 1249 04/23/21 0321  WBC 10.9* 8.4  NEUTROABS 9.6*  --   HGB 15.0 14.3  HCT 42.1 42.6  MCV 87.2 87.5  PLT 321 309     Coagulation Studies: Recent Labs    04/21/21 1249  LABPROT 15.1  INR 1.2    Imaging MRI brain without contrast 04/22/2021: Remote left parietal and chronic infarcts   ASSESSMENT AND PLAN: 70 year old male with history of prior left parietal stroke and epilepsy who presented with breakthrough seizures in the setting of subtherapeutic Depakote levels.  Epilepsy with breakthrough seizure Chronic strokes Acute encephalopathy, postictal Medication noncompliance UTI Hypokalemia -Patient presented with breakthrough seizures in the setting of subtherapeutic Depakote levels of 22, Keppra level pending as well as possible UTI -Routine EEG showed 1 seizure without clinical signs at 1249  on 04/22/2021 arising from left temporal region, lasting about 1 minute 10 seconds. -Exam improving today but still not oriented to place and time and continues to have difficulty following complex commands.  This is likely due to postictal state and anticipate improvement over the next 1 to 2 days.  Recommendations -Continue video EEG for 24 hours, can likely discontinue tomorrow if no further seizures overnight -Continue Keppra 1500 mg twice daily, Depakote 750 mg twice daily, Vimpat 200 mg twice daily. Will transition seizure medications to p.o. -Continue seizure precautions -as needed IV Ativan 2 mg for clinical seizure-like activity -Management of rest of comorbidities per primary team  I have spent a total of 25  minutes with the patient reviewing hospital notes,  test results, labs and examining the patient as well as establishing an assessment and plan that was discussed personally with Dr Tyson Babinski.  > 50% of time was spent in direct patient care.   Lindie Spruce Epilepsy [facility] For questions after 5pm please refer to AMION to reach the Neurologist on call

## 2021-04-23 NOTE — Progress Notes (Signed)
LTM maint complete - no skin breakdown under:  Head wrap with chin strap placed on head.  Leads checked and Cz reinforced.

## 2021-04-23 NOTE — Plan of Care (Signed)
  Problem: Education: Goal: Expressions of having a comfortable level of knowledge regarding the disease process will increase Outcome: Progressing   Problem: Coping: Goal: Ability to adjust to condition or change in health will improve Outcome: Progressing Goal: Ability to identify appropriate support needs will improve Outcome: Progressing   Problem: Health Behavior/Discharge Planning: Goal: Compliance with prescribed medication regimen will improve Outcome: Progressing   Problem: Medication: Goal: Risk for medication side effects will decrease Outcome: Progressing   Problem: Clinical Measurements: Goal: Complications related to the disease process, condition or treatment will be avoided or minimized Outcome: Progressing Goal: Diagnostic test results will improve Outcome: Progressing   Problem: Safety: Goal: Verbalization of understanding the information provided will improve Outcome: Progressing   Problem: Self-Concept: Goal: Level of anxiety will decrease Outcome: Progressing Goal: Ability to verbalize feelings about condition will improve Outcome: Progressing   Problem: Education: Goal: Ability to demonstrate management of disease process will improve Outcome: Progressing Goal: Ability to verbalize understanding of medication therapies will improve Outcome: Progressing Goal: Individualized Educational Video(s) Outcome: Progressing   Problem: Activity: Goal: Capacity to carry out activities will improve Outcome: Progressing   Problem: Cardiac: Goal: Ability to achieve and maintain adequate cardiopulmonary perfusion will improve Outcome: Progressing

## 2021-04-23 NOTE — Progress Notes (Addendum)
PROGRESS NOTE  Corey Meyers WUJ:[id number] DOB: [birth year] DOA: 04/22/2021 PCP: Lauro Regulus, MD   LOS: 1 day   Brief narrative:  Corey Meyers is a 70 y.o. male with medical history significant of hypertension, hyperlipidemia, prior stroke with residual right-sided weakness and seizure disorder on Keppra at home.  Patient takes Xarelto at baseline and has combined heart failure,CAD presented to the [facility] with expressive aphasia.  Out of tPA window.  Blood work was mostly unremarkable with WBC 10.9, valproic acid low at 22.  CT head negative for acute infarct.  CTA without large vessel occlusion.  MRI was negative for acute stroke.  Neurology was consulted and felt that the patient's presentation was due to seizure either nonconvulsive status epilepticus versus prolonged postictal lethargy/aphasia.  EEG was suggestive of epilepsy/cortical dysfunction from left anterior temporal region.  Given very high potential for seizure recurrence, patient was transferred to [facility] for continuous EEG.  Neurology added Vimpat 200 mg twice daily to his antiepileptic drug regimen.   Assessment/Plan:  Principal Problem:   Seizures (HCC) Active Problems:   Essential hypertension   CHF exacerbation (HCC)   UTI (urinary tract infection)   Seizure disorder EEG was suggestive of cortical dysfunction in the left anterior temporal region.  Currently undergoing continuous EEG due to high risk.  Neurology has been consulted.  Patient was initially loaded with Depakote 1500 mg and is being continued at 500 mg 3 times a day.  Neurology also recommended Vimpat 200 mg twice a day and Keppra 1500 mg twice a day.  We will follow neurology recommendations.  Still NPO.  We will get the speech evaluation. Patient had been taken off Keppra as advised by her doctor which was gradually tapered.   Possible UTI Urinalysis done at [facility] saw the bacteria and nitrite.  No fever  or signs of sepsis but on Rocephin.  Will continue.  Follow urine culture.  No leukocytosis today.   Acute on chronic combined CHF Chest x-ray showed cardiomegaly and diffuse interstitial opacity suggestive of pulmonary edema.  2D echocardiogram done on 05/16/2020 showed LV ejection fraction of 35 to 40% with grade 2 diastolic dysfunction.  Patient was not on diuretic at home.  Received 1 dose of IV diuretic.  Repeat echocardiogram has been requested.  Continue strict intake and output charting Daily weights.    Hypokalemia.  We will replenish.  Check levels in a.m.   Essential hypertension Continue Coreg.  Latest blood pressure of 104/73.   History of CVA, left ventricular thrombus Continue Xarelto and Lipitor.  DVT prophylaxis:  rivaroxaban (XARELTO) tablet 20 mg    Code Status: Full code  Family Communication: I spoke with the patient's wife and updated her about the clinical condition of the patient.   Status is: Inpatient  Remains inpatient appropriate because:Ongoing diagnostic testing needed not appropriate for outpatient work up, Unsafe d/c plan, IV treatments appropriate due to intensity of illness or inability to take PO, and Inpatient level of care appropriate due to severity of illness  Dispo: The patient is from: Home              Anticipated d/c is to: Home              Patient currently is not medically stable to d/c.   Difficult to place patient No  Consultants: Neurology  Procedures: Continuous EEG  Anti-infectives:  Rocephin IV  Anti-infectives (From admission, onward)    Start  Dose/Rate Route Frequency Ordered Stop   04/23/21 0000  cefTRIAXone (ROCEPHIN) 1 g in sodium chloride 0.9 % 100 mL IVPB        1 g 200 mL/hr over 30 Minutes Intravenous Every 24 hours 04/22/21 2256          Subjective: Today, patient was seen and examined at bedside.  States that he is okay.  Denies any nausea vomiting or headache.  Appears to be confused at times and  repeating few answers.  Objective: Vitals:   04/23/21 0622 04/23/21 0756  BP: (!) 102/53 104/73  Pulse: 63 74  Resp:  19  Temp:  98.4 F (36.9 C)  SpO2:  94%    Intake/Output Summary (Last 24 hours) at 04/23/2021 0911 Last data filed at 04/23/2021 0058 Gross per 24 hour  Intake --  Output 200 ml  Net -200 ml   Filed Weights   04/23/21 0500  Weight: 110.8 kg   Body mass index is 33.13 kg/m.   Physical Exam: GENERAL: Patient is alert awake but confused at times, oriented to place.  Not in obvious distress.  Obese HENT: No scleral pallor or icterus. Pupils equally reactive to light. Oral mucosa is moist NECK: is supple, no gross swelling noted. CHEST: Clear to auscultation. No crackles or wheezes.  Diminished breath sounds bilaterally. CVS: S1 and S2 heard, no murmur. Regular rate and rhythm.  ABDOMEN: Soft, non-tender, bowel sounds are present. EXTREMITIES: No edema. CNS: Confused and disoriented to time and person, moving extremities. SKIN: warm and dry without rashes.  Data Review: I have personally reviewed the following laboratory data and studies,  CBC: Recent Labs  Lab 04/21/21 1249 04/23/21 0321  WBC 10.9* 8.4  NEUTROABS 9.6*  --   HGB 15.0 14.3  HCT 42.1 42.6  MCV 87.2 87.5  PLT 321 309   Basic Metabolic Panel: Recent Labs  Lab 04/21/21 1249 04/23/21 0321  NA 138 141  K 3.9 3.3*  CL 101 102  CO2 31 30  GLUCOSE 122* 81  BUN 15 14  CREATININE 0.92 0.81  CALCIUM 9.1 8.6*   Liver Function Tests: Recent Labs  Lab 04/21/21 1249  AST 25  ALT 19  ALKPHOS 67  BILITOT 0.6  PROT 8.3*  ALBUMIN 4.0   No results for input(s): LIPASE, AMYLASE in the last 168 hours. No results for input(s): AMMONIA in the last 168 hours. Cardiac Enzymes: No results for input(s): CKTOTAL, CKMB, CKMBINDEX, TROPONINI in the last 168 hours. BNP (last 3 results) Recent Labs    04/23/21 0321  BNP 73.2    ProBNP (last 3 results) No results for input(s): PROBNP in  the last 8760 hours.  CBG: Recent Labs  Lab 04/21/21 1248  GLUCAP 121*   Recent Results (from the past 240 hour(s))  Resp Panel by RT-PCR (Flu A&B, Covid) Nasopharyngeal Swab     Status: None   Collection Time: 04/22/21 11:35 AM   Specimen: Nasopharyngeal Swab; Nasopharyngeal(NP) swabs in vial transport medium  Result Value Ref Range Status   SARS Coronavirus 2 by RT PCR NEGATIVE NEGATIVE Final    Comment: (NOTE) SARS-CoV-2 target nucleic acids are NOT DETECTED.  The SARS-CoV-2 RNA is generally detectable in upper respiratory specimens during the acute phase of infection. The lowest concentration of SARS-CoV-2 viral copies this assay can detect is 138 copies/mL. A negative result does not preclude SARS-Cov-2 infection and should not be used as the sole basis for treatment or other patient management decisions. A negative result  may occur with  improper specimen collection/handling, submission of specimen other than nasopharyngeal swab, presence of viral mutation(s) within the areas targeted by this assay, and inadequate number of viral copies(<138 copies/mL). A negative result must be combined with clinical observations, patient history, and epidemiological information. The expected result is Negative.  Fact Sheet for Patients:  BloggerCourse.com  Fact Sheet for Healthcare Providers:  SeriousBroker.it  This test is no t yet approved or cleared by the [place] FDA and  has been authorized for detection and/or diagnosis of SARS-CoV-2 by FDA under an Emergency Use Authorization (EUA). This EUA will remain  in effect (meaning this test can be used) for the duration of the COVID-19 declaration under Section 564(b)(1) of the Act, 21 U.S.C.section 360bbb-3(b)(1), unless the authorization is terminated  or revoked sooner.       Influenza A by PCR NEGATIVE NEGATIVE Final   Influenza B by PCR NEGATIVE NEGATIVE Final     Comment: (NOTE) The Xpert Xpress SARS-CoV-2/FLU/RSV plus assay is intended as an aid in the diagnosis of influenza from Nasopharyngeal swab specimens and should not be used as a sole basis for treatment. Nasal washings and aspirates are unacceptable for Xpert Xpress SARS-CoV-2/FLU/RSV testing.  Fact Sheet for Patients: BloggerCourse.com  Fact Sheet for Healthcare Providers: SeriousBroker.it  This test is not yet approved or cleared by the [place] FDA and has been authorized for detection and/or diagnosis of SARS-CoV-2 by FDA under an Emergency Use Authorization (EUA). This EUA will remain in effect (meaning this test can be used) for the duration of the COVID-19 declaration under Section 564(b)(1) of the Act, 21 U.S.C. section 360bbb-3(b)(1), unless the authorization is terminated or revoked.  Performed at [facility], [address] [place], [place] [address]      Studies: CT ANGIO HEAD NECK W WO CM  Result Date: 04/21/2021 CLINICAL DATA:  Neuro deficit, acute, stroke suspected EXAM: CT ANGIOGRAPHY HEAD AND NECK TECHNIQUE: Multidetector CT imaging of the head and neck was performed using the standard protocol during bolus administration of intravenous contrast. Multiplanar CT image reconstructions and MIPs were obtained to evaluate the vascular anatomy. Carotid stenosis measurements (when applicable) are obtained utilizing NASCET criteria, using the distal internal carotid diameter as the denominator. CONTRAST:  86mL OMNIPAQUE IOHEXOL 350 MG/ML SOLN COMPARISON:  None. FINDINGS: CTA NECK FINDINGS Motion limited evaluation.  Within this limitation: Aortic arch: Great vessel origins are patent. Right carotid system: No evidence of dissection, stenosis (50% or greater) or occlusion. Left carotid system: No evidence of dissection, stenosis (50% or greater) or occlusion. There is a shelf-like filling defect along the posterior  aspect of the left ICA origin, compatible with a carotid web (see series 8, images 39 through 142) Vertebral arteries: Codominant. No evidence of dissection, stenosis (50% or greater) or occlusion. Skeleton: Moderate multilevel degenerative change. Flowing anterior osteophytes at multiple levels throughout the cervical and imaged upper thoracic spine, suggestive of diffuse idiopathic skeletal hyperostosis. Other neck: No acute findings. Upper chest: Visualized lung apices are clear. Review of the MIP images confirms the above findings CTA HEAD FINDINGS Anterior circulation: Bilateral intracranial ICAs, MCAs, and ACAs are patent without proximal hemodynamically significant stenosis. Calcific atherosclerosis of bilateral ICAs without greater than 50% stenosis. Posterior circulation: Bilateral intradural vertebral arteries, basilar artery, and posterior cerebral arteries are patent without proximal hemodynamically significant stenosis. Venous sinuses: As permitted by contrast timing, patent. Review of the MIP images confirms the above findings IMPRESSION: CTA head: No large vessel occlusion or  proximal hemodynamically significant stenosis. CTA neck: 1. No evidence of significant (greater than 50%) stenosis on this motion limited exam. 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 This finding has been reported to increase the risk of ischemic stroke. 3. Elongated styloid processes bilaterally extending to the hyoid bone, which can be seen with Eagle syndrome in the correct clinical setting. Electronically Signed   By: Feliberto Harts MD   On: 04/21/2021 19:09   DG Chest 2 View  Result Date: 04/22/2021 CLINICAL DATA:  Cough, former smoker EXAM: CHEST - 2 VIEW COMPARISON:  05/15/2020 FINDINGS: Cardiomegaly. Mild, diffuse bilateral interstitial pulmonary opacity. Disc degenerative disease of the thoracic spine. IMPRESSION: Cardiomegaly with mild, diffuse bilateral interstitial pulmonary opacity, likely edema. No focal  airspace opacity. Electronically Signed   By: Lauralyn Primes M.D.   On: 04/22/2021 09:11   CT HEAD WO CONTRAST  Result Date: 04/21/2021 CLINICAL DATA:  Altered. EXAM: CT HEAD WITHOUT CONTRAST TECHNIQUE: Contiguous axial images were obtained from the base of the skull through the vertex without intravenous contrast. COMPARISON:  05/15/2020. FINDINGS: Brain: No evidence of acute large vascular territory infarction, hemorrhage, hydrocephalus, extra-axial collection or mass lesion/mass effect. Remote infarct in the left parietal lobe. Small remote infarct in the anterior left basal ganglia. Associated ex vacuo ventricular dilation. Vascular: No hyperdense vessel identified. Calcific atherosclerosis. Skull: No acute fracture. Sinuses/Orbits: Moderate scattered ethmoid air cell and bilateral frontoethmoidal recess mucosal thickening. No acute findings in the visualized orbits. Other: No mastoid effusions. IMPRESSION: 1. No evidence of acute intracranial abnormality. 2. Remote infarcts in the left parietal lobe and left basal ganglia. 3. Moderate scattered ethmoid air cell and bilateral frontoethmoidal recess mucosal thickening. Electronically Signed   By: Feliberto Harts MD   On: 04/21/2021 15:15   MR BRAIN WO CONTRAST  Result Date: 04/22/2021 CLINICAL DATA:  Right-sided weakness. EXAM: MRI HEAD WITHOUT CONTRAST TECHNIQUE: Multiplanar, multiecho pulse sequences of the brain and surrounding structures were obtained without intravenous contrast. COMPARISON:  05/17/2020 FINDINGS: Brain: No acute infarction, hemorrhage, hydrocephalus, extra-axial collection or mass lesion. Moderate size remote left parietal infarct affecting cortex and white matter. Chronic lacunar infarct at the left caudate head. Normal brain volume. Vascular: Normal flow voids. Skull and upper cervical spine: Normal marrow signal. Sinuses/Orbits: Mucosal thickening in the bilateral paranasal sinuses, chronic and mildly improved from 2021. Other:  Motion degraded IMPRESSION: 1. Motion degraded brain MRI without acute finding or change from 2021. 2. Remote left parietal and caudate infarcts. Electronically Signed   By: Marnee Spring M.D.   On: 04/22/2021 04:38   EEG adult  Result Date: 04/22/2021 [name], MD     04/22/2021  1:16 PM Patient Name: LEVERETT CAMPLIN MRN: [id number] Epilepsy Attending: [name] Referring Physician/Provider: Dr Lindajo Royal Date: 04/22/2021 Duration: 23.13 mins Patient history: 70 year old male with history of left parietal lobe and caudate infarcts who presented with confusion and expressive aphasia.  EEG done for seizures. Level of alertness: Awake, asleep AEDs during EEG study: Keppra, Depakote Technical aspects: This EEG study was done with scalp electrodes positioned according to the 10-20 International system of electrode placement. Electrical activity was acquired at a sampling rate of 500Hz  and reviewed with a high frequency filter of 70Hz  and a low frequency filter of 1Hz . EEG data were recorded continuously and digitally stored. Description: The posterior dominant rhythm consists of 10 Hz activity of moderate voltage (25-35 uV) seen predominantly in posterior head regions, symmetric and reactive to eye opening  and eye closing. Sleep was characterized by vertex waves, sleep spindles (12 to 14 Hz), maximal frontocentral region.  Abundant spikes were noted in left anterior temporal region, maximal F7/T7, quasiperiodic. One seizure without clinical signs was also noted at 1249 arising from left anterior temporal region during which initial EEG showed sharply contoured 4 to 5 Hz theta slowing which gradually evolved into 2 to 3 Hz delta slowing and involved all of left hemisphere.  Seizure lasted for about 1 minute 10 seconds. EEG also showed continuous 3-5 Hz theta-delta slowing in left anterior temporal region, which at times appeared rhythmic.  Hyperventilation and photic stimulation were not performed.    ABNORMALITY -Seizure without clinical sign, left anterior temporal region. -Spike, left anterior temporal region -Continuous slow, left anterior temporal region IMPRESSION: This study showed one seizure without clinical signs on 1249, arising from left anterior temporal region, lasting about 1 minute 10 seconds.  Additionally there is evidence of epileptogenicity as well as cortical dysfunction arising from left anterior temporal region suggestive of underlying structural abnormality/stroke with high potential for seizure recurrence. Priyanka Annabelle Harman   Overnight EEG with video  Result Date: 04/23/2021 [name], MD     04/23/2021  8:27 AM Patient Name: TRAVAS SCHEXNAYDER MRN: [id number] Epilepsy Attending: [name] Referring Physician/Provider: Dr Ritta Slot Duration: 04/22/2021 2120 to 04/23/2021 0825 Patient history: 70 year old male with history of left parietal lobe and caudate infarcts who presented with confusion and expressive aphasia.  EEG done for seizures.  Level of alertness: Awake, asleep  AEDs during EEG study: Keppra, Depakote  Technical aspects: This EEG study was done with scalp electrodes positioned according to the 10-20 International system of electrode placement. Electrical activity was acquired at a sampling rate of  and reviewed with a high frequency filter of  and a low frequency filter of . EEG data were recorded continuously and digitally stored.  Description: The posterior dominant rhythm consists of 10 Hz activity of moderate voltage (25-35 uV) seen predominantly in posterior head regions, symmetric and reactive to eye opening and eye closing. Sleep was characterized by vertex waves, sleep spindles (12 to 14 Hz), maximal frontocentral region. Abundant spikes were noted in left anterior temporal region, maximal F7/T7, quasiperiodic. EEG also showed continuous 3-5 Hz theta-delta slowing in left anterior temporal region, which at times appeared rhythmic.   Hyperventilation and photic stimulation were not performed.    ABNORMALITY -Spike, left anterior temporal region -Continuous slow, left anterior temporal region  IMPRESSION: This study showed evidence of epileptogenicity as well as cortical dysfunction arising from left anterior temporal region suggestive of underlying structural abnormality/stroke with high potential for seizure recurrence.  No definite seizure were seen during the study.  Priyanka Nicanor Bake, MD  [facility] 04/23/2021  If 7PM-7AM, please contact night-coverage

## 2021-04-23 NOTE — Plan of Care (Signed)
Pt is currently resting. IV infusions completed for tonight. EEG in progress. Strict I&O. No distress or signs or symptoms of seizures noted.   Problem: Education: Goal: Expressions of having a comfortable level of knowledge regarding the disease process will increase 04/23/2021 0542 by Vergie Living, RN Outcome: Not Progressing 04/23/2021 0400 by Vergie Living, RN Outcome: Progressing   Problem: Coping: Goal: Ability to adjust to condition or change in health will improve 04/23/2021 0542 by Vergie Living, RN Outcome: Not Progressing 04/23/2021 0400 by Vergie Living, RN Outcome: Progressing Goal: Ability to identify appropriate support needs will improve 04/23/2021 0542 by Vergie Living, RN Outcome: Not Progressing 04/23/2021 0400 by Vergie Living, RN Outcome: Progressing   Problem: Health Behavior/Discharge Planning: Goal: Compliance with prescribed medication regimen will improve 04/23/2021 0542 by Vergie Living, RN Outcome: Not Progressing 04/23/2021 0400 by Vergie Living, RN Outcome: Progressing   Problem: Medication: Goal: Risk for medication side effects will decrease 04/23/2021 0542 by Vergie Living, RN Outcome: Not Progressing 04/23/2021 0400 by Vergie Living, RN Outcome: Progressing   Problem: Clinical Measurements: Goal: Complications related to the disease process, condition or treatment will be avoided or minimized 04/23/2021 0542 by Vergie Living, RN Outcome: Not Progressing 04/23/2021 0400 by Vergie Living, RN Outcome: Progressing Goal: Diagnostic test results will improve 04/23/2021 0542 by Vergie Living, RN Outcome: Not Progressing 04/23/2021 0400 by Vergie Living, RN Outcome: Progressing   Problem: Safety: Goal: Verbalization of understanding the information provided will improve 04/23/2021 0542 by Vergie Living, RN Outcome: Not Progressing 04/23/2021 0400 by Vergie Living, RN Outcome: Progressing    Problem: Self-Concept: Goal: Level of anxiety will decrease 04/23/2021 0542 by Vergie Living, RN Outcome: Not Progressing 04/23/2021 0400 by Vergie Living, RN Outcome: Progressing Goal: Ability to verbalize feelings about condition will improve 04/23/2021 0542 by Vergie Living, RN Outcome: Not Progressing 04/23/2021 0400 by Vergie Living, RN Outcome: Progressing   Problem: Education: Goal: Ability to demonstrate management of disease process will improve Outcome: Not Progressing Goal: Ability to verbalize understanding of medication therapies will improve Outcome: Not Progressing Goal: Individualized Educational Video(s) Outcome: Not Progressing   Problem: Activity: Goal: Capacity to carry out activities will improve Outcome: Not Progressing   Problem: Cardiac: Goal: Ability to achieve and maintain adequate cardiopulmonary perfusion will improve Outcome: Not Progressing

## 2021-04-24 DIAGNOSIS — R569 Unspecified convulsions: Secondary | ICD-10-CM

## 2021-04-24 LAB — CBC
HCT: 39.3 % (ref 39.0–52.0)
Hemoglobin: 13.5 g/dL (ref 13.0–17.0)
MCH: 29.5 pg (ref 26.0–34.0)
MCHC: 34.4 g/dL (ref 30.0–36.0)
MCV: 86 fL (ref 80.0–100.0)
Platelets: 297 10*3/uL (ref 150–400)
RBC: 4.57 MIL/uL (ref 4.22–5.81)
RDW: 13.2 % (ref 11.5–15.5)
WBC: 9.5 10*3/uL (ref 4.0–10.5)
nRBC: 0 % (ref 0.0–0.2)

## 2021-04-24 LAB — BASIC METABOLIC PANEL
Anion gap: 5 (ref 5–15)
BUN: 17 mg/dL (ref 8–23)
CO2: 27 mmol/L (ref 22–32)
Calcium: 8.5 mg/dL — ABNORMAL LOW (ref 8.9–10.3)
Chloride: 104 mmol/L (ref 98–111)
Creatinine, Ser: 0.78 mg/dL (ref 0.61–1.24)
GFR, Estimated: >60 mL/min (ref >60)
Glucose, Bld: 90 mg/dL (ref 70–99)
Potassium: 4.3 mmol/L (ref 3.5–5.1)
Sodium: 136 mmol/L (ref 135–145)

## 2021-04-24 LAB — MAGNESIUM: Magnesium: 1.8 mg/dL (ref 1.7–2.4)

## 2021-04-24 LAB — LEVETIRACETAM LEVEL: Levetiracetam Lvl: <1 ug/mL — ABNORMAL LOW (ref 10.0–40.0)

## 2021-04-24 NOTE — Procedures (Addendum)
Patient Name: Corey Meyers  MRN: [id number]  Epilepsy Attending: [name]  Referring Physician/Provider: Dr Ritta Slot Duration: 04/23/2021 2120 to 04/24/2021 1148  Patient history: 70 year old male with history of left parietal lobe and caudate infarcts who presented with confusion and expressive aphasia.  EEG done for seizures.   Level of alertness: Awake, asleep   AEDs during EEG study: Keppra, Depakote, Vimpat   Technical aspects: This EEG study was done with scalp electrodes positioned according to the 10-20 International system of electrode placement. Electrical activity was acquired at a sampling rate of 500Hz  and reviewed with a high frequency filter of 70Hz  and a low frequency filter of 1Hz . EEG data were recorded continuously and digitally stored.   Description: The posterior dominant rhythm consists of 10 Hz activity of moderate voltage (25-35 uV) seen predominantly in posterior head regions, symmetric and reactive to eye opening and eye closing. Sleep was characterized by vertex waves, sleep spindles (12 to 14 Hz), maximal frontocentral region. Abundant spikes were noted in left anterior temporal region, maximal F7/T7, quasiperiodic. EEG also showed intermittent 3-5 Hz theta-delta slowing in left anterior temporal region. Hyperventilation and photic stimulation were not performed.      ABNORMALITY -Spike, left anterior temporal region -Intermittent slow, left anterior temporal region   IMPRESSION: This study showed evidence of epileptogenicity as well as cortical dysfunction arising from left anterior temporal region likely due to underlying stroke. No definite seizure were seen during the study.   Corey Meyers 

## 2021-04-24 NOTE — Evaluation (Signed)
Occupational Therapy Evaluation Patient Details Name: Corey Meyers MRN: [id number] DOB: [birth year] Today's Date: 04/24/2021    History of Present Illness 70 y/o male presented to [facility] ED on 8/3 for expressive aphasia. CT head negative for acute abnormalities. MRI negative for acute abnormalities. EEG showed evidence of epileptogenicity as well as cortical dysfunction arising from L anterior temporal region suggestive of underlying structural abnormality/stroke with high potential for seizure recurrence. 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  PTA he lived alone, with frequent check ins from his ex-wife who lives close.  He is retired, walked without as AD, and was independent with ADL/IADL.  Barriers is listed below.  Currently he is close to his baseline.  He does present with some waxing and waning of cognition.  He presents with mild confusion and decreased complex thought at times.  Spouse states he is close to baseline, and they expect he will discharge home when cleared medically.  No further needs in the acute setting.      Follow Up Recommendations  No OT follow up    Equipment Recommendations  None recommended by OT    Recommendations for Other Services       Precautions / Restrictions Precautions Precautions: Fall Precaution Comments: seizure Restrictions Weight Bearing Restrictions: No      Mobility Bed Mobility Overal bed mobility: Independent                  Transfers Overall transfer level: Modified independent               General transfer comment: able to walk the unit without AD.    Balance Overall balance assessment: Mild deficits observed, not formally tested                                         ADL either performed or assessed with clinical judgement   ADL Overall ADL's : At baseline                                        General ADL Comments: Able to complete ADL from sit/stand level.  Safety cues as needed.     Vision Patient Visual Report: No change from baseline       Perception     Praxis      Pertinent Vitals/Pain Pain Score: 0-No pain Pain Intervention(s): Monitored during session     Hand Dominance Right   Extremity/Trunk Assessment Upper Extremity Assessment Upper Extremity Assessment: Overall WFL for tasks assessed   Lower Extremity Assessment Lower Extremity Assessment: Defer to PT evaluation   Cervical / Trunk Assessment Cervical / Trunk Assessment: Normal   Communication Communication Communication: Expressive difficulties   Cognition Arousal/Alertness: Awake/alert Behavior During Therapy: WFL for tasks assessed/performed Overall Cognitive Status: Impaired/Different from baseline Area of Impairment: Problem solving                       Following Commands: Follows one step commands consistently;Follows multi-step commands inconsistently Safety/Judgement: Decreased awareness of safety   Problem Solving: Slow processing     General Comments       Exercises     Shoulder Instructions  Home Living Family/patient expects to be discharged to:: Private residence Living Arrangements: Alone Available Help at Discharge: Family;Available PRN/intermittently Type of Home: House Home Access: Stairs to enter [employer] of Steps: 2 Entrance Stairs-Rails: None Home Layout: One level     Bathroom Shower/Tub: Chief Strategy Officer: Standard     Home Equipment: None   Additional Comments: Per Chart: Patient lives alone per ex wife. She checks on him frequently and lives a mile from him. Reports he started driving again recently after initial seizure.      Prior Functioning/Environment Level of Independence: Independent                 OT Problem List: Decreased safety awareness      OT  Treatment/Interventions:      OT Goals(Current goals can be found in the care plan section) Acute Rehab OT Goals Patient Stated Goal: hoping to go home OT Goal Formulation: With patient Time For Goal Achievement: 04/24/21 Potential to Achieve Goals: Good  OT Frequency:     Barriers to D/C:  Clearance for driving.            Co-evaluation              AM-PAC OT "6 Clicks" Daily Activity     Outcome Measure Help from another person eating meals?: None Help from another person taking care of personal grooming?: None Help from another person toileting, which includes using toliet, bedpan, or urinal?: None Help from another person bathing (including washing, rinsing, drying)?: None Help from another person to put on and taking off regular upper body clothing?: None Help from another person to put on and taking off regular lower body clothing?: None 6 Click Score: 24   End of Session Equipment Utilized During Treatment: Gait belt  Activity Tolerance: Patient tolerated treatment well Patient left: in chair;with call bell/phone within reach;with family/visitor present  OT Visit Diagnosis: Other symptoms and signs involving cognitive function                Time: 1458-1520 OT Time Calculation (min): 22 min Charges:  OT General Charges $OT Visit: 1 Visit OT Evaluation $OT Eval Moderate Complexity: 1 Mod  04/24/2021  Rich, OTR/L  Acute Rehabilitation Services  Office:  [phone]   Suzanna Obey 04/24/2021, 3:45 PM

## 2021-04-24 NOTE — Progress Notes (Signed)
PROGRESS NOTE    Corey Meyers  UUV:[id number] DOB: July 03, 1951 DOA: 04/22/2021 PCP: Lauro Regulus, MD    Brief Narrative:  Corey Meyers is a 70 y.o. male with medical history significant of hypertension, hyperlipidemia, prior stroke with residual right-sided weakness and seizure disorder on Keppra at home.  Patient takes Xarelto at baseline and has combined heart failure,CAD presented to the [facility] with expressive aphasia.  Out of tPA window.  Blood work was mostly unremarkable with WBC 10.9, valproic acid low at 22.  CT head negative for acute infarct.  CTA without large vessel occlusion.  MRI was negative for acute stroke.  Neurology was consulted and felt that the patient's presentation was due to seizure either nonconvulsive status epilepticus versus prolonged postictal lethargy/aphasia.  EEG was suggestive of epilepsy/cortical dysfunction from left anterior temporal region.  Given very high potential for seizure recurrence, patient was transferred to [facility] for continuous EEG.  Neurology added Vimpat 200 mg twice daily to his antiepileptic drug regimen.  Per his wife she had been trying to wean off his Keppra and he had been off approximately 10 days when he had the seizure.   Assessment & Plan:   Principal Problem:   Seizures (HCC) Active Problems:   Essential hypertension   CHF exacerbation (HCC)   UTI (urinary tract infection)  Seizure disorder EEG was suggestive of cortical dysfunction in the left anterior temporal region.   Patient now on Vimpat 200 mg twice daily, Keppra 1500 mg twice daily, Depakote 750 mg twice daily Per wife he seems much less aphasic but still seems somewhat confused at times.   Possible UTI Greater than 100,000 colonies of staph epidermis noted On Rocephin, await sensitivities   Acute on chronic combined CHF Chest x-ray showed cardiomegaly and diffuse interstitial opacity suggestive of pulmonary edema.  2D  echocardiogram done on 05/16/2020 showed LV ejection fraction of 35 to 40% with grade 2 diastolic dysfunction.  Current echo is without significant change from previous.  Patient was not on diuretic at home.  Received 1 dose of IV diuretic.       Hypokalemia.   Replete Trend, K is 4.3 today   Essential hypertension Continue Coreg.  Latest blood pressure of 104/73.   History of CVA, left ventricular thrombus Continue Xarelto and Lipitor.   DVT prophylaxis: KV:[id number]  Code Status: Full code  Family Communication: Wife at bedside Disposition Plan: Home when able,  transition of care states patient declined home health at this time. Per patient's wife they are unable to afford this medication as he had to have a prior Auth for it previously.  Have asked about getting meds for this patient and am awaiting a response Patient remains inpatient due to unsafe discharge plan  Consultants:  Neurology  Procedures: Continuous EEG  Antimicrobials: Anti-infectives (From admission, onward)    Start     Dose/Rate Route Frequency Ordered Stop   04/23/21 0000  cefTRIAXone (ROCEPHIN) 1 g in sodium chloride 0.9 % 100 mL IVPB        1 g 200 mL/hr over 30 Minutes Intravenous Every 24 hours 04/22/21 2256          Subjective: Patient feels well today.  He answers questions appropriately but occasionally stumbles over his words.  Objective: Vitals:   04/24/21 0700 04/24/21 1118 04/24/21 1533 04/24/21 1632  BP: 115/79 116/68 121/85 128/81  Pulse: 67 72 75 71  Resp: 16 18 18    Temp: 98.3 F (  36.8 C) (!) 97.5 F (36.4 C) 98.1 F (36.7 C)   TempSrc: Oral Oral Oral   SpO2: 96% 98% 98%   Weight:        Intake/Output Summary (Last 24 hours) at 04/24/2021 1651 Last data filed at 04/24/2021 1400 Gross per 24 hour  Intake 677.16 ml  Output 1900 ml  Net -1222.84 ml   Filed Weights   04/23/21 0500 04/24/21 0500  Weight: 110.8 kg 111.3 kg    Examination:  General exam: Appears calm and  comfortable  Respiratory system: Clear to auscultation. Respiratory effort normal. Cardiovascular system: S1 & S2 heard, RRR.  Gastrointestinal system: Abdomen is nondistended, soft and nontender.  Central nervous system: Alert and oriented. No focal neurological deficits. Extremities: Symmetric  Skin: No rashes Psychiatry: Judgement and insight appear normal. Mood & affect appropriate.     Data Reviewed: I have personally reviewed following labs and imaging studies  CBC: Recent Labs  Lab 04/21/21 1249 04/23/21 0321 04/24/21 0306  WBC 10.9* 8.4 9.5  NEUTROABS 9.6*  --   --   HGB 15.0 14.3 13.5  HCT 42.1 42.6 39.3  MCV 87.2 87.5 86.0  PLT 321 309 297   Basic Metabolic Panel: Recent Labs  Lab 04/21/21 1249 04/23/21 0321 04/24/21 0306  NA 138 141 136  K 3.9 3.3* 4.3  CL 101 102 104  CO2 31 30 27   GLUCOSE 122* 81 90  BUN 15 14 17   CREATININE 0.92 0.81 0.78  CALCIUM 9.1 8.6* 8.5*  MG  --   --  1.8   GFR: Estimated Creatinine Clearance: 112.3 mL/min (by C-G formula based on SCr of 0.78 mg/dL). Liver Function Tests: Recent Labs  Lab 04/21/21 1249  AST 25  ALT 19  ALKPHOS 67  BILITOT 0.6  PROT 8.3*  ALBUMIN 4.0    Coagulation Profile: Recent Labs  Lab 04/21/21 1249  INR 1.2    HbA1C: Recent Labs    04/22/21 0625  HGBA1C 5.9*   CBG: Recent Labs  Lab 04/21/21 1248  GLUCAP 121*   Lipid Profile: Recent Labs    04/22/21 0625  CHOL 103  HDL 37*  LDLCALC 54  TRIG 60  CHOLHDL 2.8    Sepsis Labs: Recent Results (from the past 240 hour(s))  Resp Panel by RT-PCR (Flu A&B, Covid) Nasopharyngeal Swab     Status: None   Collection Time: 04/22/21 11:35 AM   Specimen: Nasopharyngeal Swab; Nasopharyngeal(NP) swabs in vial transport medium  Result Value Ref Range Status   SARS Coronavirus 2 by RT PCR NEGATIVE NEGATIVE Final    Comment: (NOTE) SARS-CoV-2 target nucleic acids are NOT DETECTED.  The SARS-CoV-2 RNA is generally detectable in upper  respiratory specimens during the acute phase of infection. The lowest concentration of SARS-CoV-2 viral copies this assay can detect is 138 copies/mL. A negative result does not preclude SARS-Cov-2 infection and should not be used as the sole basis for treatment or other patient management decisions. A negative result may occur with  improper specimen collection/handling, submission of specimen other than nasopharyngeal swab, presence of viral mutation(s) within the areas targeted by this assay, and inadequate number of viral copies(<138 copies/mL). A negative result must be combined with clinical observations, patient history, and epidemiological information. The expected result is Negative.  Fact Sheet for Patients:  06/22/21  Fact Sheet for Healthcare Providers:  06/22/21  This test is no t yet approved or cleared by the [employer] FDA and  has been authorized for detection  and/or diagnosis of SARS-CoV-2 by FDA under an Emergency Use Authorization (EUA). This EUA will remain  in effect (meaning this test can be used) for the duration of the COVID-19 declaration under Section 564(b)(1) of the Act, 21 U.S.C.section 360bbb-3(b)(1), unless the authorization is terminated  or revoked sooner.       Influenza A by PCR NEGATIVE NEGATIVE Final   Influenza B by PCR NEGATIVE NEGATIVE Final    Comment: (NOTE) The Xpert Xpress SARS-CoV-2/FLU/RSV plus assay is intended as an aid in the diagnosis of influenza from Nasopharyngeal swab specimens and should not be used as a sole basis for treatment. Nasal washings and aspirates are unacceptable for Xpert Xpress SARS-CoV-2/FLU/RSV testing.  Fact Sheet for Patients: BloggerCourse.comhttps://www.fda.gov/media/152166/download  Fact Sheet for Healthcare Providers: SeriousBroker.ithttps://www.fda.gov/media/152162/download  This test is not yet approved or cleared by the [place] FDA and has been  authorized for detection and/or diagnosis of SARS-CoV-2 by FDA under an Emergency Use Authorization (EUA). This EUA will remain in effect (meaning this test can be used) for the duration of the COVID-19 declaration under Section 564(b)(1) of the Act, 21 U.S.C. section 360bbb-3(b)(1), unless the authorization is terminated or revoked.  Performed at [facility], [address] [place], [place] [address]   Urine Culture     Status: Abnormal (Preliminary result)   Collection Time: 04/23/21 12:58 AM   Specimen: Urine, Clean Catch  Result Value Ref Range Status   Specimen Description URINE, CLEAN CATCH  Final   Special Requests NONE  Final   Culture (A)  Final    >=100,000 COLONIES/mL STAPHYLOCOCCUS EPIDERMIDIS SUSCEPTIBILITIES TO FOLLOW Performed at [facility], [address] [place], [place] [address]    Report Status PENDING  Incomplete      Radiology Studies: Overnight EEG with video  Result Date: 04/23/2021 Charlsie QuestYadav, Priyanka O, MD     04/24/2021  6:52 AM Patient Name: Despina PoleRoger D Fauble MRN: [id number] Epilepsy Attending: Charlsie QuestPriyanka O Yadav Referring Physician/Provider: Dr Ritta SlotMcNeill Kirkpatrick Duration: 04/22/2021 2120 to 04/23/2021 2120 Patient history: 70 year old male with history of left parietal lobe and caudate infarcts who presented with confusion and expressive aphasia.  EEG done for seizures.  Level of alertness: Awake, asleep  AEDs during EEG study: Keppra, Depakote, Vimpat  Technical aspects: This EEG study was done with scalp electrodes positioned according to the 10-20 International system of electrode placement. Electrical activity was acquired at a sampling rate of 500Hz  and reviewed with a high frequency filter of 70Hz  and a low frequency filter of 1Hz . EEG data were recorded continuously and digitally stored.  Description: The posterior dominant rhythm consists of 10 Hz activity of moderate voltage (25-35 uV) seen predominantly in posterior head regions, symmetric and  reactive to eye opening and eye closing. Sleep was characterized by vertex waves, sleep spindles (12 to 14 Hz), maximal frontocentral region. Abundant spikes were noted in left anterior temporal region, maximal F7/T7, quasiperiodic. EEG also showed continuous 3-5 Hz theta-delta slowing in left anterior temporal region, which at times appeared rhythmic.  Hyperventilation and photic stimulation were not performed.    ABNORMALITY -Spike, left anterior temporal region -Continuous slow, left anterior temporal region  IMPRESSION: This study showed evidence of epileptogenicity as well as cortical dysfunction arising from left anterior temporal region suggestive of underlying structural abnormality/stroke with high potential for seizure recurrence.  No definite seizure were seen during the study.  Charlsie Questriyanka O Yadav   ECHOCARDIOGRAM COMPLETE  Result Date: 04/23/2021    ECHOCARDIOGRAM REPORT  Patient Name:   AZAIAH LICCIARDI Sze Date of Exam: 04/23/2021 Medical Rec #:  [id number]   Height:       72.0 in Accession #:    [id number]  Weight:       244.3 lb Date of Birth:  09-24-1950   BSA:          2.320 m Patient Age:    70 years    BP:           113/70 mmHg Patient Gender: M           HR:           68 bpm. Exam Location:  Inpatient Procedure: 2D Echo Indications:    acute systolic chf  History:        Patient has prior history of Echocardiogram examinations. CHF;                 Risk Factors:Dyslipidemia and Hypertension.  Sonographer:    Delcie Roch Referring Phys: [id number] VASUNDHRA RATHORE IMPRESSIONS  1. Left ventricular ejection fraction, by estimation, is 35 to 40%. The left ventricle has moderately decreased function. The left ventricle has no regional wall motion abnormalities. Left ventricular diastolic parameters were normal. There is severe akinesis of the left ventricular, mid-apical anteroseptal wall, apical segment and lateral wall.  2. Right ventricular systolic function is normal. The right ventricular size is  normal. There is normal pulmonary artery systolic pressure.  3. The mitral valve is normal in structure. Mild mitral valve regurgitation.  4. The aortic valve is normal in structure. Aortic valve regurgitation is not visualized. FINDINGS  Left Ventricle: Left ventricular ejection fraction, by estimation, is 35 to 40%. The left ventricle has moderately decreased function. The left ventricle has no regional wall motion abnormalities. Severe akinesis of the left ventricular, mid-apical anteroseptal wall, apical segment and lateral wall. Definity contrast agent was given IV to delineate the left ventricular endocardial borders. The left ventricular internal cavity size was normal in size. There is no left ventricular hypertrophy. Left ventricular diastolic parameters were normal. Right Ventricle: The right ventricular size is normal. Right vetricular wall thickness was not well visualized. Right ventricular systolic function is normal. There is normal pulmonary artery systolic pressure. The tricuspid regurgitant velocity is 1.85 m/s, and with an assumed right atrial pressure of 3 mmHg, the estimated right ventricular systolic pressure is 16.7 mmHg. Left Atrium: Left atrial size was normal in size. Right Atrium: Right atrial size was normal in size. Pericardium: There is no evidence of pericardial effusion. Mitral Valve: The mitral valve is normal in structure. Mild mitral valve regurgitation. Tricuspid Valve: The tricuspid valve is grossly normal. Tricuspid valve regurgitation is trivial. Aortic Valve: The aortic valve is normal in structure. Aortic valve regurgitation is not visualized. Pulmonic Valve: The pulmonic valve was grossly normal. Pulmonic valve regurgitation is not visualized. Aorta: The aortic root and ascending aorta are structurally normal, with no evidence of dilitation. IAS/Shunts: The atrial septum is grossly normal.  LEFT VENTRICLE PLAX 2D LVIDd:         5.10 cm  Diastology LVIDs:         3.60 cm  LV  e' medial:    8.92 cm/s LV PW:         1.10 cm  LV E/e' medial:  8.6 LV IVS:        1.20 cm  LV e' lateral:   10.10 cm/s LVOT diam:     2.00 cm  LV E/e' lateral:  7.6 LV SV:         64 LV SV Index:   28 LVOT Area:     3.14 cm  RIGHT VENTRICLE             IVC RV S prime:     11.40 cm/s  IVC diam: 1.90 cm TAPSE (M-mode): 1.9 cm LEFT ATRIUM           Index       RIGHT ATRIUM           Index LA diam:      3.80 cm 1.64 cm/m  RA Area:     13.00 cm LA Vol (A4C): 47.9 ml 20.65 ml/m RA Volume:   27.70 ml  11.94 ml/m  AORTIC VALVE LVOT Vmax:   94.50 cm/s LVOT Vmean:  65.200 cm/s LVOT VTI:    0.205 m  AORTA Ao Root diam: 3.10 cm Ao Asc diam:  3.30 cm MITRAL VALVE               TRICUSPID VALVE MV Area (PHT): 3.27 cm    TR Peak grad:   13.7 mmHg MV Decel Time: 232 msec    TR Vmax:        185.00 cm/s MV E velocity: 76.50 cm/s MV A velocity: 58.70 cm/s  SHUNTS MV E/A ratio:  1.30        Systemic VTI:  0.20 m                            Systemic Diam: 2.00 cm Kristeen Miss MD Electronically signed by Kristeen Miss MD Signature Date/Time: 04/23/2021/5:36:41 PM    Final      Scheduled Meds:  atorvastatin  80 mg Oral Daily   carvedilol  6.25 mg Oral BID WC   divalproex  750 mg Oral BID   lacosamide  200 mg Oral BID   levETIRAcetam  1,500 mg Oral BID   rivaroxaban  20 mg Oral Daily   Continuous Infusions:  cefTRIAXone (ROCEPHIN)  IV Stopped (04/24/21 0121)     LOS: 2 days    Reva Bores, MD 04/24/2021 4:51 PM [phone] [facility] If 7PM-7AM, please contact night-coverage 04/24/2021, 4:51 PM

## 2021-04-24 NOTE — Progress Notes (Signed)
vLTM EEG complete. No skin breakdown seen 

## 2021-04-24 NOTE — Care Management (Signed)
Spoke w patient and OT. Patient declines HH services. OT confirms he is mobilizing well w/o assistive device, no HH needed.

## 2021-04-24 NOTE — Progress Notes (Signed)
Neurology Progress Note  Patient ID: Corey Meyers is a 70 y.o. with PMHx of  has a past medical history of Hyperlipidemia, Hypertension, and Stroke (HCC).  Initially consulted for: altered mental status, speech difficulties     Subjective:  No complaints today; pt seems to have difficulty with word finding but appears to be baseline.  Exam: Vitals:   04/24/21 0700 04/24/21 1118  BP: 115/79 116/68  Pulse: 67 72  Resp: 16 18  Temp: 98.3 F (36.8 C) (!) 97.5 F (36.4 C)  SpO2: 96% 98%   Gen: In bed, comfortable. Oriented to person and place at time of this exam  Resp: non-labored breathing, no grossly audible wheezing Cardiac: Perfusing extremities well  Abd: soft, nt  Neuro: MS: awake, alert, preoccupied with being discharged but able to be reoriented. Appears to have intermittent bouts of expressive aphasia. Able to follow commands, simple and multi-step. CN: II-XII appear intact at time of exam.  Motor: intact exam with antigravity in all four limbs without drift seen. Sensory:intact to touch    On Depakote 750mg  twice daily, vimpat 200mg  twice daily, keppra 1.5gm twice daily Pertinent Results:   EEG 04/24/2021 This study showed evidence of epileptogenicity as well as cortical dysfunction arising from left anterior temporal region likely due to underlying stroke. No definite seizure were seen during the study.  MRI Brain without contrast 04/22/2021  1. Motion degraded brain MRI without acute finding or change from 2021. 2. Remote left parietal and caudate infarcts.      Impression: 70 year old right handed white male with history of seizure disorder, on multiple AED, admitted with altered speech and difficult word finding. EEG shows epileptogenicity but no definite seizures.  Recommendations: Continue with current doses of aed as ordered when discharged  Okay for discharge from neurological standpoint/ follow up with neurology in 2-4 weeks. We will sign off at this time  but remain available to assist.

## 2021-04-25 LAB — URINE CULTURE: Culture: >=100000 — AB

## 2021-04-25 MED ORDER — NITROFURANTOIN MONOHYD MACRO 100 MG PO CAPS
100.0000 mg | ORAL_CAPSULE | Freq: Two times a day (BID) | ORAL | Status: DC
  Administered 2021-04-25 – 2021-04-26 (×3): 100 mg via ORAL
  Filled 2021-04-25 (×4): qty 1

## 2021-04-25 NOTE — Care Management (Signed)
Spoke to patient's wife about medication concerns. She states that last year he was dc'd on a seizure med that cost $75/month. She called [facility] and got a prior [place] and the copay is now $0 through December. She can't remember if it was Keppra or Depakote.  She is just concerned about what the costs will be for his medications at DC. Cost of medications can be ascertained when prescriptions are filled. Otherwise, benefit check has also been sent for vimpat, depakote, and keppra that will result tomorrow.  If patient discharges tomorrow I would recommend filling meds at [facility] pharmacy so they can be sent home w him.  Wife states that she was in a car accident last night and does not have a car. Patient will need [facility] or taxi at discharge. CM also instructed wife to call [employer] about assistance with transportation to follow up appointments as part of benefits package.

## 2021-04-25 NOTE — Progress Notes (Signed)
PROGRESS NOTE    Farrell Pantaleo Hoyt  TFT:[id number] DOB: 19-Dec-1950 DOA: 04/22/2021 PCP: Lauro Regulus, MD    No chief complaint on file.   Brief Narrative:  DEVAL MROCZKA is a 70 y.o. male with medical history significant of hypertension, hyperlipidemia, prior stroke with residual right-sided weakness and seizure disorder on Keppra at home.  Patient takes Xarelto at baseline and has combined heart failure,CAD presented to the [facility] hospital with expressive aphasia.  Out of tPA window.  Blood work was mostly unremarkable with WBC 10.9, valproic acid low at 22.  CT head negative for acute infarct.  CTA without large vessel occlusion.  MRI was negative for acute stroke.  Neurology was consulted and felt that the patient's presentation was due to seizure either nonconvulsive status epilepticus versus prolonged postictal lethargy/aphasia.  EEG was suggestive of epilepsy/cortical dysfunction from left anterior temporal region.  Given very high potential for seizure recurrence, patient was transferred to [facility] for continuous EEG.  Neurology added Vimpat 200 mg twice daily to his antiepileptic drug regimen.  Per his wife she had been trying to wean off his Keppra and he had been off approximately 10 days when he had the seizure.  Subjective:  Aaox3, Dysarthria/expressive aphasia  Declined home health but states needs meds asistance   Assessment & Plan:   Principal Problem:   Seizures (HCC) Active Problems:   Essential hypertension   CHF exacerbation (HCC)   UTI (urinary tract infection)  Seizure disorder Present with resident with his and aphasia  EEG was suggestive of cortical dysfunction in the left anterior temporal region.   Seen by neurology Patient now on Vimpat 200 mg twice daily, Keppra 1500 mg twice daily, Depakote 750 mg twice daily Transitional care assistant with medication, currently doing benefit checking, will need TOC pharmacy for discharge  meds  UTI Greater than 100,000 colony of staph epidermis Was on Rocephin, transition to Macrobid  Acute on chronic combined CHF Chest x-ray showed cardiomegaly and diffuse interstitial opacity suggestive of pulmonary edema.  2D echocardiogram done on 05/16/2020 showed LV ejection fraction of 35 to 40% with grade 2 diastolic dysfunction.  Current echo is without significant change from previous.  Patient was not on diuretic at home.  Received 1 dose of IV diuretic on presentation Currently euvolemic, consider as needed Lasix at discharge He is followed  by [facility] cardiology, recommend Outpatient follow-up with [facility] cardiology Dr Darrold Junker  H/o cva and left ventricular thrombus On xarelto and lipitor     : Body mass index is 31.75 kg/m.Marland Kitchen       Unresulted Labs (From admission, onward)    None         DVT prophylaxis:  rivaroxaban (XARELTO) tablet 20 mg   Code Status: Full Family Communication: Patient Disposition:   Status is: Inpatient  Dispo: The patient is from: Home              Anticipated d/c is to: Home, he declined home health              Anticipated d/c date is: Tomorrow on August 8, awaiting for benefit check for discharge meds, will need TOC pharmacy for discharge meds                Consultants:  Neurology  Procedures:  EEG  Antimicrobials:   Rocephin and Macrobid     Objective: Vitals:   04/24/21 2326 04/25/21 0352 04/25/21 0500 04/25/21 0726  BP: 119/71 122/82  112/67  Pulse: 72 75  66  Resp: Temp: 98 F (36.7 C) 98.1 F (36.7 C)  98.2 F (36.8 C)  TempSrc: Oral Oral  Oral  SpO2: 100% 99%  95%  Weight:   106.2 kg     Intake/Output Summary (Last 24 hours) at 04/25/2021 0940 Last data filed at 04/25/2021 0500 Gross per 24 hour  Intake --  Output 1550 ml  Net -1550 ml   Filed Weights   04/23/21 0500 04/24/21 0500 04/25/21 0500  Weight: 110.8 kg 111.3 kg 106.2 kg    Examination:  General exam: calm,  NAD Respiratory system: Clear to auscultation. Respiratory effort normal. Cardiovascular system: S1 & S2 heard, RRR. No JVD, no murmur, No pedal edema. Gastrointestinal system: Abdomen is nondistended, soft and nontender.  Normal bowel sounds heard. Central nervous system: Alert and oriented.  Expressive aphasia, right-sided weakness Extremities: No edema Skin: No rashes, lesions or ulcers Psychiatry: Calm and cooperative.     Data Reviewed: I have personally reviewed following labs and imaging studies  CBC: Recent Labs  Lab 04/21/21 1249 04/23/21 0321 04/24/21 0306  WBC 10.9* 8.4 9.5  NEUTROABS 9.6*  --   --   HGB 15.0 14.3 13.5  HCT 42.1 42.6 39.3  MCV 87.2 87.5 86.0  PLT 321 309 297    Basic Metabolic Panel: Recent Labs  Lab 04/21/21 1249 04/23/21 0321 04/24/21 0306  NA 138 141 136  K 3.9 3.3* 4.3  CL 101 102 104  CO2 GLUCOSE 122* 81 90  BUN CREATININE 0.92 0.81 0.78  CALCIUM 9.1 8.6* 8.5*  MG  --   --  1.8    GFR: Estimated Creatinine Clearance: 109.7 mL/min (by C-G formula based on SCr of 0.78 mg/dL).  Liver Function Tests: Recent Labs  Lab 04/21/21 1249  AST 25  ALT 19  ALKPHOS 67  BILITOT 0.6  PROT 8.3*  ALBUMIN 4.0    CBG: Recent Labs  Lab 04/21/21 1248  GLUCAP 121*     Recent Results (from the past 240 hour(s))  Resp Panel by RT-PCR (Flu A&B, Covid) Nasopharyngeal Swab     Status: None   Collection Time: 04/22/21 11:35 AM   Specimen: Nasopharyngeal Swab; Nasopharyngeal(NP) swabs in vial transport medium  Result Value Ref Range Status   SARS Coronavirus 2 by RT PCR NEGATIVE NEGATIVE Final    Comment: (NOTE) SARS-CoV-2 target nucleic acids are NOT DETECTED.  The SARS-CoV-2 RNA is generally detectable in upper respiratory specimens during the acute phase of infection. The lowest concentration of SARS-CoV-2 viral copies this assay can detect is 138 copies/mL. A negative result does not preclude  SARS-Cov-2 infection and should not be used as the sole basis for treatment or other patient management decisions. A negative result may occur with  improper specimen collection/handling, submission of specimen other than nasopharyngeal swab, presence of viral mutation(s) within the areas targeted by this assay, and inadequate number of viral copies(<138 copies/mL). A negative result must be combined with clinical observations, patient history, and epidemiological information. The expected result is Negative.  Fact Sheet for Patients:  BloggerCourse.com  Fact Sheet for Healthcare Providers:  SeriousBroker.it  This test is no t yet approved or cleared by the [place] FDA and  has been authorized for detection and/or diagnosis of SARS-CoV-2 by FDA under an Emergency Use Authorization (EUA). This EUA will remain  in effect (meaning this test can be used) for  the duration of the COVID-19 declaration under Section 564(b)(1) of the Act, 21 U.S.C.section 360bbb-3(b)(1), unless the authorization is terminated  or revoked sooner.       Influenza A by PCR NEGATIVE NEGATIVE Final   Influenza B by PCR NEGATIVE NEGATIVE Final    Comment: (NOTE) The Xpert Xpress SARS-CoV-2/FLU/RSV plus assay is intended as an aid in the diagnosis of influenza from Nasopharyngeal swab specimens and should not be used as a sole basis for treatment. Nasal washings and aspirates are unacceptable for Xpert Xpress SARS-CoV-2/FLU/RSV testing.  Fact Sheet for Patients: BloggerCourse.com  Fact Sheet for Healthcare Providers: SeriousBroker.it  This test is not yet approved or cleared by the [place] FDA and has been authorized for detection and/or diagnosis of SARS-CoV-2 by FDA under an Emergency Use Authorization (EUA). This EUA will remain in effect (meaning this test can be used) for the duration of  the COVID-19 declaration under Section 564(b)(1) of the Act, 21 U.S.C. section 360bbb-3(b)(1), unless the authorization is terminated or revoked.  Performed at [facility], [address] [place], [place] [address]   Urine Culture     Status: Abnormal (Preliminary result)   Collection Time: 04/23/21 12:58 AM   Specimen: Urine, Clean Catch  Result Value Ref Range Status   Specimen Description URINE, CLEAN CATCH  Final   Special Requests NONE  Final   Culture (A)  Final    >=100,000 COLONIES/mL STAPHYLOCOCCUS EPIDERMIDIS SUSCEPTIBILITIES TO FOLLOW Performed at [facility], [address] [place], [place] [address]    Report Status PENDING  Incomplete         Radiology Studies: ECHOCARDIOGRAM COMPLETE  Result Date: 04/23/2021    ECHOCARDIOGRAM REPORT   Patient Name:   RIYANSH GERSTNER Gin Date of Exam: 04/23/2021 Medical Rec #:  [id number]   Height:       72.0 in Accession #:    [id number]  Weight:       244.3 lb Date of Birth:  1951-05-01   BSA:          2.320 m Patient Age:    70 years    BP:           113/70 mmHg Patient Gender: M           HR:           68 bpm. Exam Location:  Inpatient Procedure: 2D Echo Indications:    acute systolic chf  History:        Patient has prior history of Echocardiogram examinations. CHF;                 Risk Factors:Dyslipidemia and Hypertension.  Sonographer:    Delcie Roch Referring Phys: [id number] VASUNDHRA RATHORE IMPRESSIONS  1. Left ventricular ejection fraction, by estimation, is 35 to 40%. The left ventricle has moderately decreased function. The left ventricle has no regional wall motion abnormalities. Left ventricular diastolic parameters were normal. There is severe akinesis of the left ventricular, mid-apical anteroseptal wall, apical segment and lateral wall.  2. Right ventricular systolic function is normal. The right ventricular size is normal. There is normal pulmonary artery systolic pressure.  3. The mitral valve is normal in  structure. Mild mitral valve regurgitation.  4. The aortic valve is normal in structure. Aortic valve regurgitation is not visualized. FINDINGS  Left Ventricle: Left ventricular ejection fraction, by estimation, is 35 to 40%. The left ventricle has moderately decreased function. The left ventricle has no regional wall motion abnormalities.  Severe akinesis of the left ventricular, mid-apical anteroseptal wall, apical segment and lateral wall. Definity contrast agent was given IV to delineate the left ventricular endocardial borders. The left ventricular internal cavity size was normal in size. There is no left ventricular hypertrophy. Left ventricular diastolic parameters were normal. Right Ventricle: The right ventricular size is normal. Right vetricular wall thickness was not well visualized. Right ventricular systolic function is normal. There is normal pulmonary artery systolic pressure. The tricuspid regurgitant velocity is 1.85 m/s, and with an assumed right atrial pressure of 3 mmHg, the estimated right ventricular systolic pressure is 16.7 mmHg. Left Atrium: Left atrial size was normal in size. Right Atrium: Right atrial size was normal in size. Pericardium: There is no evidence of pericardial effusion. Mitral Valve: The mitral valve is normal in structure. Mild mitral valve regurgitation. Tricuspid Valve: The tricuspid valve is grossly normal. Tricuspid valve regurgitation is trivial. Aortic Valve: The aortic valve is normal in structure. Aortic valve regurgitation is not visualized. Pulmonic Valve: The pulmonic valve was grossly normal. Pulmonic valve regurgitation is not visualized. Aorta: The aortic root and ascending aorta are structurally normal, with no evidence of dilitation. IAS/Shunts: The atrial septum is grossly normal.  LEFT VENTRICLE PLAX 2D LVIDd:         5.10 cm  Diastology LVIDs:         3.60 cm  LV e' medial:    8.92 cm/s LV PW:         1.10 cm  LV E/e' medial:  8.6 LV IVS:        1.20 cm   LV e' lateral:   10.10 cm/s LVOT diam:     2.00 cm  LV E/e' lateral: 7.6 LV SV:         64 LV SV Index:   28 LVOT Area:     3.14 cm  RIGHT VENTRICLE             IVC RV S prime:     11.40 cm/s  IVC diam: 1.90 cm TAPSE (M-mode): 1.9 cm LEFT ATRIUM           Index       RIGHT ATRIUM           Index LA diam:      3.80 cm 1.64 cm/m  RA Area:     13.00 cm LA Vol (A4C): 47.9 ml 20.65 ml/m RA Volume:   27.70 ml  11.94 ml/m  AORTIC VALVE LVOT Vmax:   94.50 cm/s LVOT Vmean:  65.200 cm/s LVOT VTI:    0.205 m  AORTA Ao Root diam: 3.10 cm Ao Asc diam:  3.30 cm MITRAL VALVE               TRICUSPID VALVE MV Area (PHT): 3.27 cm    TR Peak grad:   13.7 mmHg MV Decel Time: 232 msec    TR Vmax:        185.00 cm/s MV E velocity: 76.50 cm/s MV A velocity: 58.70 cm/s  SHUNTS MV E/A ratio:  1.30        Systemic VTI:  0.20 m                            Systemic Diam: 2.00 cm Kristeen MissPhilip Nahser MD Electronically signed by Kristeen MissPhilip Nahser MD Signature Date/Time: 04/23/2021/5:36:41 PM    Final         Scheduled Meds:  atorvastatin  80  mg Oral Daily   carvedilol  6.25 mg Oral BID WC   divalproex  750 mg Oral BID   lacosamide  200 mg Oral BID   levETIRAcetam  1,500 mg Oral BID   rivaroxaban  20 mg Oral Daily   Continuous Infusions:  cefTRIAXone (ROCEPHIN)  IV 1 g (04/24/21 2338)     LOS: 3 days   Time spent: 25 mins Greater than 50% of this time was spent in counseling, explanation of diagnosis, planning of further management, and coordination of care.   Voice Recognition Reubin Milan dictation system was used to create this note, attempts have been made to correct errors. Please contact the author with questions and/or clarifications.   Albertine Grates, MD PhD FACP [facility]  Available via Epic secure chat 7am-7pm for nonurgent issues Please page for urgent issues To page the attending provider between 7A-7P or the covering provider during after hours 7P-7A, please log into the web site [email or web address] and access using  universal [place] password for that web site. If you do not have the password, please call the hospital operator.    04/25/2021, 9:40 AM

## 2021-04-26 ENCOUNTER — Other Ambulatory Visit (HOSPITAL_COMMUNITY): Payer: Self-pay

## 2021-04-26 MED ORDER — DIVALPROEX SODIUM 250 MG PO DR TAB
750.0000 mg | DELAYED_RELEASE_TABLET | Freq: Two times a day (BID) | ORAL | 3 refills | Status: DC
  Filled 2021-04-26: qty 180, 30d supply, fill #0

## 2021-04-26 MED ORDER — LACOSAMIDE 200 MG PO TABS
200.0000 mg | ORAL_TABLET | Freq: Two times a day (BID) | ORAL | 1 refills | Status: DC
  Filled 2021-04-26: qty 60, 30d supply, fill #0

## 2021-04-26 MED ORDER — LEVETIRACETAM 750 MG PO TABS
1500.0000 mg | ORAL_TABLET | Freq: Two times a day (BID) | ORAL | 1 refills | Status: DC
  Filled 2021-04-26: qty 120, 30d supply, fill #0

## 2021-04-26 MED ORDER — NITROFURANTOIN MONOHYD MACRO 100 MG PO CAPS
100.0000 mg | ORAL_CAPSULE | Freq: Two times a day (BID) | ORAL | 0 refills | Status: AC
  Filled 2021-04-26: qty 8, 4d supply, fill #0

## 2021-04-26 NOTE — Care Management Important Message (Signed)
Important Message  Patient Details  Name: [name] MRN: [id number] Date of Birth: 1951/08/06   Medicare Important Message Given:  Yes     Dorena Bodo 04/26/2021, 4:32 PM

## 2021-04-26 NOTE — TOC Transition Note (Addendum)
Transition of Care [facility]) - CM/SW Discharge Note   Patient Details  Name: Corey Meyers MRN: [id number] Date of Birth: [birth year]  Transition of Care [facility]) CM/SW Contact:  Bess Kinds, RN Phone Number: [phone] 04/26/2021, 11:51 AM   Clinical Narrative:     Received call from [facility] pharmacy. Patient's copay for Vimpat is over $200/month. TOC pharmacy had spoken with patient and wife, and this is not affordable. 30 day supply of generic medication provided at cost for $30.   CM called Vimpat patient assistance at [phone]. Patient assistance application received. Application printed and provided to patient at bedside. Discussed that he is to follow up with neurologist in one week, and he can take application to his appointment.   12:31 - Spoke with Noel Gerold on the phone to discuss patient assistance application. Application emailed to her at [email or web address]. Reviewed discharge medications and cost, and that pharmacy would call for payment prior to delivery to room. Discussed that [facility] transportation would be been arranged for transport home. Rhonda requested to be called when transportation set up, so that she can get to patient's home.   Final next level of care: Home/Self Care Barriers to Discharge: No Barriers Identified   Patient Goals and CMS Choice Patient states their goals for this hospitalization and ongoing recovery are:: return home with wife CMS Medicare.gov Compare Post Acute Care list provided to:: Patient Choice offered to / list presented to : Patient  Discharge Placement                       Discharge Plan and Services                DME Arranged: N/A DME Agency: NA       HH Arranged: Refused HH HH Agency: NA        Social Determinants of Health (SDOH) Interventions     Readmission Risk Interventions No flowsheet data found.

## 2021-04-26 NOTE — Progress Notes (Signed)
Physical Therapy Treatment Patient Details Name: Corey Meyers MRN: [id number] DOB: 12-Oct-1950 Today's Date: 04/26/2021    History of Present Illness 70 y/o male presented to [facility] ED on 8/3 for expressive aphasia. CT head negative for acute abnormalities. MRI negative for acute abnormalities. EEG showed evidence of epileptogenicity as well as cortical dysfunction arising from L anterior temporal region suggestive of underlying structural abnormality/stroke with high potential for seizure recurrence. PMH: HLD, HTN, CVA with residual R sided weakness and seizures, CHF, CAD    PT Comments    Pt expresses desire to d/c home, agreeable to OOB mobility. Pt ambulatory around unit with supervision to min guard assist, pt with decreased knowledge of deficits and has some higher level balance deficits as evidenced by DGI 19/24. PT encouraged increased supervision support by ex-wife at d/c, pt expresses understanding. PT to continue to follow.     Follow Up Recommendations  Home health PT;Supervision/Assistance - 24 hour     Equipment Recommendations  Other (comment) (TBD)    Recommendations for Other Services       Precautions / Restrictions Precautions Precautions: Fall Precaution Comments: seizure Restrictions Weight Bearing Restrictions: No    Mobility  Bed Mobility Overal bed mobility: Needs Assistance Bed Mobility: Supine to Sit;Sit to Supine     Supine to sit: Modified independent (Device/Increase time) Sit to supine: Modified independent (Device/Increase time)        Transfers Overall transfer level: Needs assistance Equipment used: None Transfers: Sit to/from Stand Sit to Stand: Supervision         General transfer comment: for safety, slow to rise/sit  Ambulation/Gait Ambulation/Gait assistance: Min guard;Supervision Gait Distance (Feet): 350 Feet Assistive device: None Gait Pattern/deviations: Step-through pattern Gait velocity: WFL   General Gait Details:  mostly min guard for safety, periods of supervision with no LOB. Pt tolerates challenge to dynamic standing balance well (see balance section).   Stairs Stairs: Yes Stairs assistance: Supervision Stair Management: One rail Right;Forwards;Alternating pattern Number of Stairs: 2 General stair comments: for safety, no evidence of LOB   Wheelchair Mobility    Modified Rankin (Stroke Patients Only)       Balance Overall balance assessment: Needs assistance Sitting-balance support: No upper extremity supported;Feet supported Sitting balance-Leahy Scale: Good     Standing balance support: No upper extremity supported;During functional activity Standing balance-Leahy Scale: Good                   Standardized Balance Assessment Standardized Balance Assessment : Dynamic Gait Index   Dynamic Gait Index Level Surface: Normal Change in Gait Speed: Normal Gait with Horizontal Head Turns: Mild Impairment Gait with Vertical Head Turns: Mild Impairment Gait and Pivot Turn: Normal Step Over Obstacle: Mild Impairment Step Around Obstacles: Mild Impairment Steps: Mild Impairment Total Score: 19      Cognition Arousal/Alertness: Awake/alert Behavior During Therapy: Flat affect Overall Cognitive Status: Impaired/Different from baseline Area of Impairment: Problem solving                       Following Commands: Follows multi-step commands consistently Safety/Judgement: Decreased awareness of safety   Problem Solving: Slow processing General Comments: Mostly responds monosyllabically during session, follows directions with increased time      Exercises      General Comments        Pertinent Vitals/Pain Pain Assessment: No/denies pain Pain Intervention(s): Monitored during session    Home Living  Prior Function            PT Goals (current goals can now be found in the care plan section) Acute Rehab PT Goals PT Goal  Formulation: With patient Time For Goal Achievement: 05/07/21 Potential to Achieve Goals: Good Progress towards PT goals: Progressing toward goals    Frequency    Min 3X/week      PT Plan Current plan remains appropriate    Co-evaluation              AM-PAC PT "6 Clicks" Mobility   Outcome Measure  Help needed turning from your back to your side while in a flat bed without using bedrails?: None Help needed moving from lying on your back to sitting on the side of a flat bed without using bedrails?: None Help needed moving to and from a bed to a chair (including a wheelchair)?: A Little Help needed standing up from a chair using your arms (e.g., wheelchair or bedside chair)?: A Little Help needed to walk in hospital room?: A Little Help needed climbing 3-5 steps with a railing? : A Little 6 Click Score: 20    End of Session Equipment Utilized During Treatment: Gait belt Activity Tolerance: Patient tolerated treatment well Patient left: in bed;with call bell/phone within reach;with bed alarm set Nurse Communication: Mobility status PT Visit Diagnosis: Unsteadiness on feet (R26.81);Difficulty in walking, not elsewhere classified (R26.2)     Time: 8811-0315 PT Time Calculation (min) (ACUTE ONLY): 13 min  Charges:  $Gait Training: 8-22 mins                    Marye Round, PT DPT Acute Rehabilitation Services Pager [phone]  Office [phone]    Daryn Hicks E Stroup 04/26/2021, 10:09 AM

## 2021-04-26 NOTE — Discharge Summary (Signed)
Physician Discharge Summary  Corey Meyers ZOX:[id number] DOB: 1951/03/05 DOA: 04/22/2021  PCP: Lauro Regulus, MD  Admit date: 04/22/2021 Discharge date: 04/26/2021  Admitted From: Home Disposition:  Home  Discharge Condition:Stable CODE STATUS:FULL Diet recommendation: Heart Healthy   Brief/Interim Summary: Corey Meyers is a 70 y.o. male with medical history significant of hypertension, hyperlipidemia, prior stroke with residual right-sided weakness and seizure disorder on Keppra at home.  Patient takes Xarelto at baseline and has combined heart failure,CAD presented to the [facility] regional hospital with expressive aphasia.  Out of tPA window.  Blood work was mostly unremarkable with WBC 10.9, valproic acid low at 22.  CT head negative for acute infarct.  CTA without large vessel occlusion.  MRI was negative for acute stroke.  Neurology was consulted and felt that the patient's presentation was due to seizure either nonconvulsive status epilepticus versus prolonged postictal lethargy/aphasia.  EEG was suggestive of epilepsy/cortical dysfunction from left anterior temporal region.  Given very high potential for seizure recurrence, patient was transferred to [facility] for continuous EEG.  Neurology has assessed the AED regimen.  He is medically stable for discharge.  PT recommended home health.  Following problems were addressed during his hospitalization:  Seizure disorder Present with expressive aphasia  EEG was suggestive of cortical dysfunction in the left anterior temporal region.   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  2D  echocardiogram done on 05/16/2020 showed LV ejection fraction of 35 to 40% with grade 2 diastolic dysfunction.  Current echo is without significant change from previous.  Patient was not on diuretic at home.  Received 1 dose of IV diuretic on presentation Currently euvolemic He is followed  by [facility] cardiology, recommend Outpatient follow-up with [facility] cardiology Dr Darrold Junker   H/o cva and left ventricular thrombus On xarelto and lipitor     Discharge Diagnoses:  Principal Problem:   Seizures (HCC) Active Problems:   Essential hypertension   CHF exacerbation (HCC)   UTI (urinary tract infection)    Discharge Instructions  Discharge Instructions     Ambulatory referral to Neurology   Complete by: As directed    An appointment is requested in approximately: 2 weeks   Diet - low sodium heart healthy   Complete by: As directed    Discharge instructions   Complete by: As directed    1)Please take prescribed medications as instructed 2)Follow up with your PCP in a week. 3)Follow up with neurology in 2 weeks.  Name and number of the provider group has been attached.   Increase activity slowly   Complete by: As directed       Allergies as of 04/26/2021   No Known Allergies      Medication List     STOP taking these medications    divalproex 250 MG 24 hr tablet Commonly known as: DEPAKOTE ER Replaced by: divalproex 250 MG DR tablet   lacosamide 200 mg in sodium chloride 0.9 % 25 mL   levETIRAcetam 500 MG/100ML Soln Commonly known as: KEPRRA   valproate 250 mg in dextrose 5 % 50 mL       TAKE these medications    aspirin-sod  bicarb-citric acid 325 MG Tbef tablet Commonly known as: ALKA-SELTZER Take 325 mg by mouth every 6 (six) hours as needed (indigestion).   atorvastatin 80 MG tablet Commonly known as: LIPITOR Take 80 mg by mouth daily.   carvedilol 6.25 MG tablet Commonly known as: COREG Take 6.25 mg by mouth 2 (two) times daily with a meal.    divalproex 250 MG DR tablet Commonly known as: DEPAKOTE Take 3 tablets (750 mg total) by mouth 2 (two) times daily. Replaces: divalproex 250 MG 24 hr tablet   lacosamide 200 MG Tabs tablet Commonly known as: VIMPAT Take 1 tablet (200 mg total) by mouth 2 (two) times daily.   levETIRAcetam 750 MG tablet Commonly known as: KEPPRA Take 2 tablets (1,500 mg total) by mouth 2 (two) times daily. What changed:  medication strength how much to take   multivitamin with minerals Tabs tablet Take 1 tablet by mouth daily.   nitrofurantoin (macrocrystal-monohydrate) 100 MG capsule Commonly known as: MACROBID Take 1 capsule (100 mg total) by mouth every 12 (twelve) hours for 4 days.   rivaroxaban 20 MG Tabs tablet Commonly known as: XARELTO Take 20 mg by mouth daily.        Follow-up Information     Lauro Regulus, MD. Schedule an appointment as soon as possible for a visit in 1 week(s).   Specialty: Internal Medicine Contact information: [address] [facility] [place] [address] [phone]         [facility]. Schedule an appointment as soon as possible for a visit in 1 week(s).   Specialty: Neurology Contact information: [address] [place] [address] [phone]               No Known Allergies  Consultations: Neurology   Procedures/Studies: CT ANGIO HEAD NECK W WO CM  Result Date: 04/21/2021 CLINICAL DATA:  Neuro deficit, acute, stroke suspected EXAM: CT ANGIOGRAPHY HEAD AND NECK TECHNIQUE: Multidetector CT imaging of the head and neck was performed using the standard protocol during bolus administration of intravenous contrast. Multiplanar CT image reconstructions and MIPs were obtained to evaluate the vascular anatomy. Carotid stenosis measurements (when applicable) are obtained utilizing NASCET criteria, using the distal internal carotid diameter as the denominator. CONTRAST:   75mL OMNIPAQUE IOHEXOL 350 MG/ML SOLN COMPARISON:  None. FINDINGS: CTA NECK FINDINGS Motion limited evaluation.  Within this limitation: Aortic arch: Great vessel origins are patent. Right carotid system: No evidence of dissection, stenosis (50% or greater) or occlusion. Left carotid system: No evidence of dissection, stenosis (50% or greater) or occlusion. There is a shelf-like filling defect along the posterior aspect of the left ICA origin, compatible with a carotid web (see series 8, images 39 through 142) Vertebral arteries: Codominant. No evidence of dissection, stenosis (50% or greater) or occlusion. Skeleton: Moderate multilevel degenerative change. Flowing anterior osteophytes at multiple levels throughout the cervical and imaged upper thoracic spine, suggestive of diffuse idiopathic skeletal hyperostosis. Other neck: No acute findings. Upper chest: Visualized lung apices are clear. Review of the MIP images confirms the above findings CTA HEAD FINDINGS Anterior circulation: Bilateral intracranial ICAs, MCAs, and ACAs are patent without proximal hemodynamically significant stenosis. Calcific atherosclerosis of bilateral ICAs without greater than 50% stenosis. Posterior circulation: Bilateral intradural vertebral arteries, basilar artery, and posterior cerebral arteries are patent without proximal hemodynamically significant stenosis. Venous sinuses: As permitted by contrast timing, patent. Review of the MIP images confirms the above findings IMPRESSION: CTA  head: No large vessel occlusion or proximal hemodynamically significant stenosis. CTA neck: 1. No evidence of significant (greater than 50%) stenosis on this motion limited exam. 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 This finding has been reported to increase the risk of ischemic stroke. 3. Elongated styloid processes bilaterally extending to the hyoid bone, which can be seen with Eagle syndrome in the correct clinical setting. Electronically Signed    By: Feliberto HartsFrederick S Jones MD   On: 04/21/2021 19:09   DG Chest 2 View  Result Date: 04/22/2021 CLINICAL DATA:  Cough, former smoker EXAM: CHEST - 2 VIEW COMPARISON:  05/15/2020 FINDINGS: Cardiomegaly. Mild, diffuse bilateral interstitial pulmonary opacity. Disc degenerative disease of the thoracic spine. IMPRESSION: Cardiomegaly with mild, diffuse bilateral interstitial pulmonary opacity, likely edema. No focal airspace opacity. Electronically Signed   By: Lauralyn PrimesAlex  Bibbey M.D.   On: 04/22/2021 09:11   CT HEAD WO CONTRAST  Result Date: 04/21/2021 CLINICAL DATA:  Altered. EXAM: CT HEAD WITHOUT CONTRAST TECHNIQUE: Contiguous axial images were obtained from the base of the skull through the vertex without intravenous contrast. COMPARISON:  05/15/2020. FINDINGS: Brain: No evidence of acute large vascular territory infarction, hemorrhage, hydrocephalus, extra-axial collection or mass lesion/mass effect. Remote infarct in the left parietal lobe. Small remote infarct in the anterior left basal ganglia. Associated ex vacuo ventricular dilation. Vascular: No hyperdense vessel identified. Calcific atherosclerosis. Skull: No acute fracture. Sinuses/Orbits: Moderate scattered ethmoid air cell and bilateral frontoethmoidal recess mucosal thickening. No acute findings in the visualized orbits. Other: No mastoid effusions. IMPRESSION: 1. No evidence of acute intracranial abnormality. 2. Remote infarcts in the left parietal lobe and left basal ganglia. 3. Moderate scattered ethmoid air cell and bilateral frontoethmoidal recess mucosal thickening. Electronically Signed   By: Feliberto HartsFrederick S Jones MD   On: 04/21/2021 15:15   MR BRAIN WO CONTRAST  Result Date: 04/22/2021 CLINICAL DATA:  Right-sided weakness. EXAM: MRI HEAD WITHOUT CONTRAST TECHNIQUE: Multiplanar, multiecho pulse sequences of the brain and surrounding structures were obtained without intravenous contrast. COMPARISON:  05/17/2020 FINDINGS: Brain: No acute infarction,  hemorrhage, hydrocephalus, extra-axial collection or mass lesion. Moderate size remote left parietal infarct affecting cortex and white matter. Chronic lacunar infarct at the left caudate head. Normal brain volume. Vascular: Normal flow voids. Skull and upper cervical spine: Normal marrow signal. Sinuses/Orbits: Mucosal thickening in the bilateral paranasal sinuses, chronic and mildly improved from 2021. Other: Motion degraded IMPRESSION: 1. Motion degraded brain MRI without acute finding or change from 2021. 2. Remote left parietal and caudate infarcts. Electronically Signed   By: Marnee SpringJonathon  Watts M.D.   On: 04/22/2021 04:38   EEG adult  Result Date: 04/22/2021 Charlsie QuestYadav, Priyanka O, MD     04/22/2021  1:16 PM Patient Name: Corey Meyers MRN: [id number] Epilepsy Attending: Charlsie QuestPriyanka O Yadav Referring Physician/Provider: Dr Lindajo RoyalHazel Duncan Date: 04/22/2021 Duration: 23.13 mins Patient history: 70 year old male with history of left parietal lobe and caudate infarcts who presented with confusion and expressive aphasia.  EEG done for seizures. Level of alertness: Awake, asleep AEDs during EEG study: Keppra, Depakote Technical aspects: This EEG study was done with scalp electrodes positioned according to the 10-20 International system of electrode placement. Electrical activity was acquired at a sampling rate of 500Hz  and reviewed with a high frequency filter of 70Hz  and a low frequency filter of 1Hz . EEG data were recorded continuously and digitally stored. Description: The posterior dominant rhythm consists of 10 Hz activity of moderate voltage (25-35 uV) seen predominantly in posterior head regions,  symmetric and reactive to eye opening and eye closing. Sleep was characterized by vertex waves, sleep spindles (12 to 14 Hz), maximal frontocentral region.  Abundant spikes were noted in left anterior temporal region, maximal F7/T7, quasiperiodic. One seizure without clinical signs was also noted at 1249 arising from left anterior  temporal region during which initial EEG showed sharply contoured 4 to 5 Hz theta slowing which gradually evolved into 2 to 3 Hz delta slowing and involved all of left hemisphere.  Seizure lasted for about 1 minute 10 seconds. EEG also showed continuous 3-5 Hz theta-delta slowing in left anterior temporal region, which at times appeared rhythmic.  Hyperventilation and photic stimulation were not performed.   ABNORMALITY -Seizure without clinical sign, left anterior temporal region. -Spike, left anterior temporal region -Continuous slow, left anterior temporal region IMPRESSION: This study showed one seizure without clinical signs on 1249, arising from left anterior temporal region, lasting about 1 minute 10 seconds.  Additionally there is evidence of epileptogenicity as well as cortical dysfunction arising from left anterior temporal region suggestive of underlying structural abnormality/stroke with high potential for seizure recurrence. Priyanka Annabelle Harman   Overnight EEG with video  Result Date: 04/23/2021 [name], MD     04/24/2021  6:52 AM Patient Name: Corey Meyers MRN: [id number] Epilepsy Attending: [name] Referring Physician/Provider: Dr Ritta Slot Duration: 04/22/2021 2120 to 04/23/2021 2120 Patient history: 70 year old male with history of left parietal lobe and caudate infarcts who presented with confusion and expressive aphasia.  EEG done for seizures.  Level of alertness: Awake, asleep  AEDs during EEG study: Keppra, Depakote, Vimpat  Technical aspects: This EEG study was done with scalp electrodes positioned according to the 10-20 International system of electrode placement. Electrical activity was acquired at a sampling rate of 500Hz  and reviewed with a high frequency filter of 70Hz  and a low frequency filter of 1Hz . EEG data were recorded continuously and digitally stored.  Description: The posterior dominant rhythm consists of 10 Hz activity of moderate voltage (25-35 uV) seen  predominantly in posterior head regions, symmetric and reactive to eye opening and eye closing. Sleep was characterized by vertex waves, sleep spindles (12 to 14 Hz), maximal frontocentral region. Abundant spikes were noted in left anterior temporal region, maximal F7/T7, quasiperiodic. EEG also showed continuous 3-5 Hz theta-delta slowing in left anterior temporal region, which at times appeared rhythmic.  Hyperventilation and photic stimulation were not performed.    ABNORMALITY -Spike, left anterior temporal region -Continuous slow, left anterior temporal region  IMPRESSION: This study showed evidence of epileptogenicity as well as cortical dysfunction arising from left anterior temporal region suggestive of underlying structural abnormality/stroke with high potential for seizure recurrence.  No definite seizure were seen during the study.    ECHOCARDIOGRAM COMPLETE  Result Date: 04/23/2021    ECHOCARDIOGRAM REPORT   Patient Name:   Corey Meyers Curro Date of Exam: 04/23/2021 Medical Rec #:  06/23/2021   Height:       72.0 in Accession #:    Cher Nakai  Weight:       244.3 lb Date of Birth:  [birth year]   BSA:          2.320 m Patient Age:    70 years    BP:           113/70 mmHg Patient Gender: M           HR:  68 bpm. Exam Location:  Inpatient Procedure: 2D Echo Indications:    acute systolic chf  History:        Patient has prior history of Echocardiogram examinations. CHF;                 Risk Factors:Dyslipidemia and Hypertension.  Sonographer:    Delcie Roch Referring Phys: [id number] VASUNDHRA RATHORE IMPRESSIONS  1. Left ventricular ejection fraction, by estimation, is 35 to 40%. The left ventricle has moderately decreased function. The left ventricle has no regional wall motion abnormalities. Left ventricular diastolic parameters were normal. There is severe akinesis of the left ventricular, mid-apical anteroseptal wall, apical segment and lateral wall.  2. Right ventricular systolic  function is normal. The right ventricular size is normal. There is normal pulmonary artery systolic pressure.  3. The mitral valve is normal in structure. Mild mitral valve regurgitation.  4. The aortic valve is normal in structure. Aortic valve regurgitation is not visualized. FINDINGS  Left Ventricle: Left ventricular ejection fraction, by estimation, is 35 to 40%. The left ventricle has moderately decreased function. The left ventricle has no regional wall motion abnormalities. Severe akinesis of the left ventricular, mid-apical anteroseptal wall, apical segment and lateral wall. Definity contrast agent was given IV to delineate the left ventricular endocardial borders. The left ventricular internal cavity size was normal in size. There is no left ventricular hypertrophy. Left ventricular diastolic parameters were normal. Right Ventricle: The right ventricular size is normal. Right vetricular wall thickness was not well visualized. Right ventricular systolic function is normal. There is normal pulmonary artery systolic pressure. The tricuspid regurgitant velocity is 1.85 m/s, and with an assumed right atrial pressure of 3 mmHg, the estimated right ventricular systolic pressure is 16.7 mmHg. Left Atrium: Left atrial size was normal in size. Right Atrium: Right atrial size was normal in size. Pericardium: There is no evidence of pericardial effusion. Mitral Valve: The mitral valve is normal in structure. Mild mitral valve regurgitation. Tricuspid Valve: The tricuspid valve is grossly normal. Tricuspid valve regurgitation is trivial. Aortic Valve: The aortic valve is normal in structure. Aortic valve regurgitation is not visualized. Pulmonic Valve: The pulmonic valve was grossly normal. Pulmonic valve regurgitation is not visualized. Aorta: The aortic root and ascending aorta are structurally normal, with no evidence of dilitation. IAS/Shunts: The atrial septum is grossly normal.  LEFT VENTRICLE PLAX 2D LVIDd:          5.10 cm  Diastology LVIDs:         3.60 cm  LV e' medial:    8.92 cm/s LV PW:         1.10 cm  LV E/e' medial:  8.6 LV IVS:        1.20 cm  LV e' lateral:   10.10 cm/s LVOT diam:     2.00 cm  LV E/e' lateral: 7.6 LV SV:         64 LV SV Index:   28 LVOT Area:     3.14 cm  RIGHT VENTRICLE             IVC RV S prime:     11.40 cm/s  IVC diam: 1.90 cm TAPSE (M-mode): 1.9 cm LEFT ATRIUM           Index       RIGHT ATRIUM           Index LA diam:      3.80 cm 1.64 cm/m  RA Area:  13.00 cm LA Vol (A4C): 47.9 ml 20.65 ml/m RA Volume:   27.70 ml  11.94 ml/m  AORTIC VALVE LVOT Vmax:   94.50 cm/s LVOT Vmean:  65.200 cm/s LVOT VTI:    0.205 m  AORTA Ao Root diam: 3.10 cm Ao Asc diam:  3.30 cm MITRAL VALVE               TRICUSPID VALVE MV Area (PHT): 3.27 cm    TR Peak grad:   13.7 mmHg MV Decel Time: 232 msec    TR Vmax:        185.00 cm/s MV E velocity: 76.50 cm/s MV A velocity: 58.70 cm/s  SHUNTS MV E/A ratio:  1.30        Systemic VTI:  0.20 m                            Systemic Diam: 2.00 cm Kristeen Miss MD Electronically signed by Kristeen Miss MD Signature Date/Time: 04/23/2021/5:36:41 PM    Final       Subjective: Patient seen and examined at the bedside this morning.  Hemodynamically stable for discharge.  Discharge Exam: Vitals:   04/26/21 0341 04/26/21 0758  BP: 98/69 103/70  Pulse: 74 74  Resp: 18   Temp: 98.4 F (36.9 C) 97.8 F (36.6 C)  SpO2: 92% 95%   Vitals:   04/25/21 1953 04/25/21 2317 04/26/21 0341 04/26/21 0758  BP: 110/74 99/66 98/69  103/70  Pulse: 73 63 74 74  Resp: Temp: 98.1 F (36.7 C) 98 F (36.7 C) 98.4 F (36.9 C) 97.8 F (36.6 C)  TempSrc: Oral Oral Oral Oral  SpO2: 95% 95% 92% 95%  Weight:        General: Pt is alert, awake, not in acute distress Cardiovascular: RRR, S1/S2 +, no rubs, no gallops Respiratory: CTA bilaterally, no wheezing, no rhonchi Abdominal: Soft, NT, ND, bowel sounds + Extremities: no edema, no cyanosis    The results  of significant diagnostics from this hospitalization (including imaging, microbiology, ancillary and laboratory) are listed below for reference.     Microbiology: Recent Results (from the past 240 hour(s))  Resp Panel by RT-PCR (Flu A&B, Covid) Nasopharyngeal Swab     Status: None   Collection Time: 04/22/21 11:35 AM   Specimen: Nasopharyngeal Swab; Nasopharyngeal(NP) swabs in vial transport medium  Result Value Ref Range Status   SARS Coronavirus 2 by RT PCR NEGATIVE NEGATIVE Final    Comment: (NOTE) SARS-CoV-2 target nucleic acids are NOT DETECTED.  The SARS-CoV-2 RNA is generally detectable in upper respiratory specimens during the acute phase of infection. The lowest concentration of SARS-CoV-2 viral copies this assay can detect is 138 copies/mL. A negative result does not preclude SARS-Cov-2 infection and should not be used as the sole basis for treatment or other patient management decisions. A negative result may occur with  improper specimen collection/handling, submission of specimen other than nasopharyngeal swab, presence of viral mutation(s) within the areas targeted by this assay, and inadequate number of viral copies(<138 copies/mL). A negative result must be combined with clinical observations, patient history, and epidemiological information. The expected result is Negative.  Fact Sheet for Patients:  BloggerCourse.com  Fact Sheet for Healthcare Providers:  SeriousBroker.it  This test is no t yet approved or cleared by the [place] FDA and  has been authorized for detection and/or diagnosis of SARS-CoV-2 by FDA under an Emergency Use Authorization (EUA).  This EUA will remain  in effect (meaning this test can be used) for the duration of the COVID-19 declaration under Section 564(b)(1) of the Act, 21 U.S.C.section 360bbb-3(b)(1), unless the authorization is terminated  or revoked sooner.       Influenza A  by PCR NEGATIVE NEGATIVE Final   Influenza B by PCR NEGATIVE NEGATIVE Final    Comment: (NOTE) The Xpert Xpress SARS-CoV-2/FLU/RSV plus assay is intended as an aid in the diagnosis of influenza from Nasopharyngeal swab specimens and should not be used as a sole basis for treatment. Nasal washings and aspirates are unacceptable for Xpert Xpress SARS-CoV-2/FLU/RSV testing.  Fact Sheet for Patients: BloggerCourse.com  Fact Sheet for Healthcare Providers: SeriousBroker.it  This test is not yet approved or cleared by the [place] FDA and has been authorized for detection and/or diagnosis of SARS-CoV-2 by FDA under an Emergency Use Authorization (EUA). This EUA will remain in effect (meaning this test can be used) for the duration of the COVID-19 declaration under Section 564(b)(1) of the Act, 21 U.S.C. section 360bbb-3(b)(1), unless the authorization is terminated or revoked.  Performed at [facility], [address] [place], [place] [address]   Urine Culture     Status: Abnormal   Collection Time: 04/23/21 12:58 AM   Specimen: Urine, Clean Catch  Result Value Ref Range Status   Specimen Description URINE, CLEAN CATCH  Final   Special Requests   Final    NONE Performed at [facility], [address], [place], [place] [address]    Culture >=100,000 COLONIES/mL STAPHYLOCOCCUS EPIDERMIDIS (A)  Final   Report Status 04/25/2021 FINAL  Final   Organism ID, Bacteria STAPHYLOCOCCUS EPIDERMIDIS (A)  Final      Susceptibility   Staphylococcus epidermidis - MIC*    CIPROFLOXACIN <=0.5 SENSITIVE Sensitive     GENTAMICIN <=0.5 SENSITIVE Sensitive     NITROFURANTOIN <=16 SENSITIVE Sensitive     OXACILLIN <=0.25 SENSITIVE Sensitive     TETRACYCLINE 2 SENSITIVE Sensitive     VANCOMYCIN <=0.5 SENSITIVE Sensitive     TRIMETH/SULFA <=10 SENSITIVE Sensitive     CLINDAMYCIN <=0.25 SENSITIVE Sensitive     RIFAMPIN <=0.5  SENSITIVE Sensitive     Inducible Clindamycin NEGATIVE Sensitive     * >=100,000 COLONIES/mL STAPHYLOCOCCUS EPIDERMIDIS     Labs: BNP (last 3 results) Recent Labs    04/23/21 0321  BNP 73.2   Basic Metabolic Panel: Recent Labs  Lab 04/21/21 1249 04/23/21 0321 04/24/21 0306  NA 138 141 136  K 3.9 3.3* 4.3  CL 101 102 104  CO2 31 30 27   GLUCOSE 122* 81 90  BUN 15 14 17   CREATININE 0.92 0.81 0.78  CALCIUM 9.1 8.6* 8.5*  MG  --   --  1.8   Liver Function Tests: Recent Labs  Lab 04/21/21 1249  AST 25  ALT 19  ALKPHOS 67  BILITOT 0.6  PROT 8.3*  ALBUMIN 4.0   No results for input(s): LIPASE, AMYLASE in the last 168 hours. No results for input(s): AMMONIA in the last 168 hours. CBC: Recent Labs  Lab 04/21/21 1249 04/23/21 0321 04/24/21 0306  WBC 10.9* 8.4 9.5  NEUTROABS 9.6*  --   --   HGB 15.0 14.3 13.5  HCT 42.1 42.6 39.3  MCV 87.2 87.5 86.0  PLT 321 309 297   Cardiac Enzymes: No results for input(s): CKTOTAL, CKMB, CKMBINDEX, TROPONINI in the last 168 hours. BNP: Invalid input(s): POCBNP CBG: Recent Labs  Lab  04/21/21 1248  GLUCAP 121*   D-Dimer No results for input(s): DDIMER in the last 72 hours. Hgb A1c No results for input(s): HGBA1C in the last 72 hours. Lipid Profile No results for input(s): CHOL, HDL, LDLCALC, TRIG, CHOLHDL, LDLDIRECT in the last 72 hours. Thyroid function studies No results for input(s): TSH, T4TOTAL, T3FREE, THYROIDAB in the last 72 hours.  Invalid input(s): FREET3 Anemia work up No results for input(s): VITAMINB12, FOLATE, FERRITIN, TIBC, IRON, RETICCTPCT in the last 72 hours. Urinalysis    Component Value Date/Time   COLORURINE YELLOW (A) 04/22/2021 1515   APPEARANCEUR HAZY (A) 04/22/2021 1515   LABSPEC 1.040 (H) 04/22/2021 1515   PHURINE 6.0 04/22/2021 1515   GLUCOSEU NEGATIVE 04/22/2021 1515   HGBUR SMALL (A) 04/22/2021 1515   BILIRUBINUR NEGATIVE 04/22/2021 1515   KETONESUR 20 (A) 04/22/2021 1515    PROTEINUR NEGATIVE 04/22/2021 1515   NITRITE POSITIVE (A) 04/22/2021 1515   LEUKOCYTESUR TRACE (A) 04/22/2021 1515   Sepsis Labs Invalid input(s): PROCALCITONIN,  WBC,  LACTICIDVEN Microbiology Recent Results (from the past 240 hour(s))  Resp Panel by RT-PCR (Flu A&B, Covid) Nasopharyngeal Swab     Status: None   Collection Time: 04/22/21 11:35 AM   Specimen: Nasopharyngeal Swab; Nasopharyngeal(NP) swabs in vial transport medium  Result Value Ref Range Status   SARS Coronavirus 2 by RT PCR NEGATIVE NEGATIVE Final    Comment: (NOTE) SARS-CoV-2 target nucleic acids are NOT DETECTED.  The SARS-CoV-2 RNA is generally detectable in upper respiratory specimens during the acute phase of infection. The lowest concentration of SARS-CoV-2 viral copies this assay can detect is 138 copies/mL. A negative result does not preclude SARS-Cov-2 infection and should not be used as the sole basis for treatment or other patient management decisions. A negative result may occur with  improper specimen collection/handling, submission of specimen other than nasopharyngeal swab, presence of viral mutation(s) within the areas targeted by this assay, and inadequate number of viral copies(<138 copies/mL). A negative result must be combined with clinical observations, patient history, and epidemiological information. The expected result is Negative.  Fact Sheet for Patients:  BloggerCourse.com  Fact Sheet for Healthcare Providers:  SeriousBroker.it  This test is no t yet approved or cleared by the [place] FDA and  has been authorized for detection and/or diagnosis of SARS-CoV-2 by FDA under an Emergency Use Authorization (EUA). This EUA will remain  in effect (meaning this test can be used) for the duration of the COVID-19 declaration under Section 564(b)(1) of the Act, 21 U.S.C.section 360bbb-3(b)(1), unless the authorization is terminated  or  revoked sooner.       Influenza A by PCR NEGATIVE NEGATIVE Final   Influenza B by PCR NEGATIVE NEGATIVE Final    Comment: (NOTE) The Xpert Xpress SARS-CoV-2/FLU/RSV plus assay is intended as an aid in the diagnosis of influenza from Nasopharyngeal swab specimens and should not be used as a sole basis for treatment. Nasal washings and aspirates are unacceptable for Xpert Xpress SARS-CoV-2/FLU/RSV testing.  Fact Sheet for Patients: BloggerCourse.com  Fact Sheet for Healthcare Providers: SeriousBroker.it  This test is not yet approved or cleared by the [place] FDA and has been authorized for detection and/or diagnosis of SARS-CoV-2 by FDA under an Emergency Use Authorization (EUA). This EUA will remain in effect (meaning this test can be used) for the duration of the COVID-19 declaration under Section 564(b)(1) of the Act, 21 U.S.C. section 360bbb-3(b)(1), unless the authorization is terminated or revoked.  Performed at [facility]  Lab, [address], [place], [place] [address]   Urine Culture     Status: Abnormal   Collection Time: 04/23/21 12:58 AM   Specimen: Urine, Clean Catch  Result Value Ref Range Status   Specimen Description URINE, CLEAN CATCH  Final   Special Requests   Final    NONE Performed at [facility], [address] [place], [place] [address]    Culture >=100,000 COLONIES/mL STAPHYLOCOCCUS EPIDERMIDIS (A)  Final   Report Status 04/25/2021 FINAL  Final   Organism ID, Bacteria STAPHYLOCOCCUS EPIDERMIDIS (A)  Final      Susceptibility   Staphylococcus epidermidis - MIC*    CIPROFLOXACIN <=0.5 SENSITIVE Sensitive     GENTAMICIN <=0.5 SENSITIVE Sensitive     NITROFURANTOIN <=16 SENSITIVE Sensitive     OXACILLIN <=0.25 SENSITIVE Sensitive     TETRACYCLINE 2 SENSITIVE Sensitive     VANCOMYCIN <=0.5 SENSITIVE Sensitive     TRIMETH/SULFA <=10 SENSITIVE Sensitive     CLINDAMYCIN <=0.25  SENSITIVE Sensitive     RIFAMPIN <=0.5 SENSITIVE Sensitive     Inducible Clindamycin NEGATIVE Sensitive     * >=100,000 COLONIES/mL STAPHYLOCOCCUS EPIDERMIDIS    Please note: You were cared for by a hospitalist during your hospital stay. Once you are discharged, your primary care physician will handle any further medical issues. Please note that NO REFILLS for any discharge medications will be authorized once you are discharged, as it is imperative that you return to your primary care physician (or establish a relationship with a primary care physician if you do not have one) for your post hospital discharge needs so that they can reassess your need for medications and monitor your lab values.    Time coordinating discharge: 40 minutes  SIGNED:   Burnadette Pop, MD  [facility] 04/26/2021, 8:41 AM Pager [phone]  If 7PM-7AM, please contact night-coverage [email or web address] Password [email or web address]

## 2021-04-26 NOTE — Plan of Care (Signed)
  Problem: Education: Goal: Expressions of having a comfortable level of knowledge regarding the disease process will increase Outcome: Adequate for Discharge   Problem: Coping: Goal: Ability to adjust to condition or change in health will improve Outcome: Adequate for Discharge Goal: Ability to identify appropriate support needs will improve Outcome: Adequate for Discharge   Problem: Health Behavior/Discharge Planning: Goal: Compliance with prescribed medication regimen will improve Outcome: Adequate for Discharge   Problem: Medication: Goal: Risk for medication side effects will decrease Outcome: Adequate for Discharge   Problem: Clinical Measurements: Goal: Complications related to the disease process, condition or treatment will be avoided or minimized Outcome: Adequate for Discharge Goal: Diagnostic test results will improve Outcome: Adequate for Discharge   Problem: Safety: Goal: Verbalization of understanding the information provided will improve Outcome: Adequate for Discharge   Problem: Self-Concept: Goal: Level of anxiety will decrease Outcome: Adequate for Discharge Goal: Ability to verbalize feelings about condition will improve Outcome: Adequate for Discharge   Problem: Education: Goal: Ability to demonstrate management of disease process will improve Outcome: Adequate for Discharge Goal: Ability to verbalize understanding of medication therapies will improve Outcome: Adequate for Discharge Goal: Individualized Educational Video(s) Outcome: Adequate for Discharge   Problem: Activity: Goal: Capacity to carry out activities will improve Outcome: Adequate for Discharge   Problem: Cardiac: Goal: Ability to achieve and maintain adequate cardiopulmonary perfusion will improve Outcome: Adequate for Discharge   Problem: Acute Rehab PT Goals(only PT should resolve) Goal: Patient Will Transfer Sit To/From Stand Outcome: Adequate for Discharge Goal: Pt Will  Ambulate Outcome: Adequate for Discharge Goal: Pt Will Go Up/Down Stairs Outcome: Adequate for Discharge Goal: Pt/caregiver will Perform Home Exercise Program Outcome: Adequate for Discharge

## 2021-04-26 NOTE — TOC Transition Note (Signed)
Transition of Care [facility]) - CM/SW Discharge Note   Patient Details  Name: Corey Meyers MRN: [id number] Date of Birth: [birth year]  Transition of Care [facility]) CM/SW Contact:  Bess Kinds, RN Phone Number: [phone] 04/26/2021, 9:56 AM   Clinical Narrative:     Spoke with patient at the bedside. Confirmed that he still declines HH PT at this time. Patient requests transportation home. TOC to deliver discharge medications to the room. CH rider waiver signed and emailed to [email or web address]. Confirmed home address in Epic correct. Nursing to call transportation at [phone] when patient ready to leave. No further TOC needs identified at this time.   Final next level of care: Home/Self Care Barriers to Discharge: No Barriers Identified   Patient Goals and CMS Choice Patient states their goals for this hospitalization and ongoing recovery are:: return home with wife CMS Medicare.gov Compare Post Acute Care list provided to:: Patient Choice offered to / list presented to : Patient  Discharge Placement                       Discharge Plan and Services                DME Arranged: N/A DME Agency: NA       HH Arranged: Refused HH HH Agency: NA        Social Determinants of Health (SDOH) Interventions     Readmission Risk Interventions No flowsheet data found.

## 2021-04-26 NOTE — TOC Benefit Eligibility Note (Signed)
Transition of Care [facility]) Benefit Eligibility Note    Patient Details  Name: Corey Meyers MRN: [id number] Date of Birth: August 12, 1951   Medication/Dose: Vimpat, Depakote not covered on formulary Keppra not covered but generic Levetiraceta covered and its $1.64 for 30 day supply  Covered?:  (Vimpat, Depakote not covered on formulary Keppra not covered but generic Levetiraceta covered and its $1.64 for 30 day supply)  Tier: Other  Prescription Coverage Preferred Pharmacy: local  Spoke with Person/Company/Phone Number:: Corey Meyers [phone]  Co-Pay: Levetiraceta generic for Keppra is $1.64  Prior Approval: No  Deductible: Met       Corey Meyers Phone Number: 04/26/2021, 11:01 AM

## 2021-04-27 NOTE — Care Management Important Message (Signed)
+  Important Message  Patient Details  Name: PAOLA FLYNT MRN: [id number] Date of Birth: 1951-01-05   Medicare Important Message Given:  Yes     Tiburcio Linder Stefan Church 04/27/2021, 11:21 AM

## 2021-06-24 ENCOUNTER — Other Ambulatory Visit: Payer: Self-pay | Admitting: Internal Medicine

## 2021-06-24 ENCOUNTER — Other Ambulatory Visit (HOSPITAL_BASED_OUTPATIENT_CLINIC_OR_DEPARTMENT_OTHER): Payer: Self-pay | Admitting: Internal Medicine

## 2021-06-24 DIAGNOSIS — E118 Type 2 diabetes mellitus with unspecified complications: Secondary | ICD-10-CM

## 2021-06-24 DIAGNOSIS — R7989 Other specified abnormal findings of blood chemistry: Secondary | ICD-10-CM

## 2021-07-13 ENCOUNTER — Ambulatory Visit: Payer: Medicare HMO

## 2021-07-20 ENCOUNTER — Ambulatory Visit
Admission: RE | Admit: 2021-07-20 | Discharge: 2021-07-20 | Disposition: A | Discharge status: Home / Self Care | Payer: Medicare HMO | Source: Ambulatory Visit | Attending: Internal Medicine | Admitting: Internal Medicine

## 2021-07-20 ENCOUNTER — Other Ambulatory Visit: Payer: Self-pay

## 2021-07-20 DIAGNOSIS — R7989 Other specified abnormal findings of blood chemistry: Secondary | ICD-10-CM | POA: Diagnosis not present

## 2021-07-20 DIAGNOSIS — E118 Type 2 diabetes mellitus with unspecified complications: Secondary | ICD-10-CM | POA: Insufficient documentation

## 2021-12-07 ENCOUNTER — Emergency Department: Payer: Medicare HMO

## 2021-12-07 ENCOUNTER — Inpatient Hospital Stay
Admission: EM | Admit: 2021-12-07 | Discharge: 2021-12-11 | DRG: 101 | Disposition: A | Discharge status: Home / Self Care | Payer: Medicare HMO | Attending: Internal Medicine | Admitting: Internal Medicine

## 2021-12-07 DIAGNOSIS — E860 Dehydration: Secondary | ICD-10-CM | POA: Diagnosis present

## 2021-12-07 DIAGNOSIS — G40909 Epilepsy, unspecified, not intractable, without status epilepticus: Secondary | ICD-10-CM | POA: Diagnosis present

## 2021-12-07 DIAGNOSIS — I5022 Chronic systolic (congestive) heart failure: Secondary | ICD-10-CM | POA: Diagnosis present

## 2021-12-07 DIAGNOSIS — D649 Anemia, unspecified: Secondary | ICD-10-CM | POA: Diagnosis present

## 2021-12-07 DIAGNOSIS — N401 Enlarged prostate with lower urinary tract symptoms: Secondary | ICD-10-CM | POA: Diagnosis present

## 2021-12-07 DIAGNOSIS — R338 Other retention of urine: Secondary | ICD-10-CM | POA: Diagnosis present

## 2021-12-07 DIAGNOSIS — I10 Essential (primary) hypertension: Secondary | ICD-10-CM | POA: Diagnosis not present

## 2021-12-07 DIAGNOSIS — Z7901 Long term (current) use of anticoagulants: Secondary | ICD-10-CM

## 2021-12-07 DIAGNOSIS — R569 Unspecified convulsions: Principal | ICD-10-CM

## 2021-12-07 DIAGNOSIS — I110 Hypertensive heart disease with heart failure: Secondary | ICD-10-CM | POA: Diagnosis present

## 2021-12-07 DIAGNOSIS — I69351 Hemiplegia and hemiparesis following cerebral infarction affecting right dominant side: Secondary | ICD-10-CM

## 2021-12-07 DIAGNOSIS — E785 Hyperlipidemia, unspecified: Secondary | ICD-10-CM | POA: Diagnosis present

## 2021-12-07 DIAGNOSIS — I63412 Cerebral infarction due to embolism of left middle cerebral artery: Secondary | ICD-10-CM | POA: Diagnosis present

## 2021-12-07 DIAGNOSIS — Z87891 Personal history of nicotine dependence: Secondary | ICD-10-CM

## 2021-12-07 DIAGNOSIS — G9389 Other specified disorders of brain: Secondary | ICD-10-CM | POA: Diagnosis present

## 2021-12-07 DIAGNOSIS — E871 Hypo-osmolality and hyponatremia: Secondary | ICD-10-CM | POA: Diagnosis present

## 2021-12-07 DIAGNOSIS — D72829 Elevated white blood cell count, unspecified: Secondary | ICD-10-CM | POA: Diagnosis not present

## 2021-12-07 DIAGNOSIS — Z20822 Contact with and (suspected) exposure to covid-19: Secondary | ICD-10-CM | POA: Diagnosis present

## 2021-12-07 DIAGNOSIS — I69320 Aphasia following cerebral infarction: Secondary | ICD-10-CM

## 2021-12-07 LAB — COMPREHENSIVE METABOLIC PANEL
ALT: 21 U/L (ref 0–44)
AST: 45 U/L — ABNORMAL HIGH (ref 15–41)
Albumin: 3.6 g/dL (ref 3.5–5.0)
Alkaline Phosphatase: 55 U/L (ref 38–126)
Anion gap: 17 — ABNORMAL HIGH (ref 5–15)
BUN: 13 mg/dL (ref 8–23)
CO2: 22 mmol/L (ref 22–32)
Calcium: 8.7 mg/dL — ABNORMAL LOW (ref 8.9–10.3)
Chloride: 90 mmol/L — ABNORMAL LOW (ref 98–111)
Creatinine, Ser: 0.86 mg/dL (ref 0.61–1.24)
GFR, Estimated: >60 mL/min (ref >60)
Glucose, Bld: 99 mg/dL (ref 70–99)
Potassium: 3.7 mmol/L (ref 3.5–5.1)
Sodium: 129 mmol/L — ABNORMAL LOW (ref 135–145)
Total Bilirubin: 0.6 mg/dL (ref 0.3–1.2)
Total Protein: 7.9 g/dL (ref 6.5–8.1)

## 2021-12-07 LAB — URINE DRUG SCREEN, QUALITATIVE (ARMC ONLY)
Amphetamines, Ur Screen: NOT DETECTED
Barbiturates, Ur Screen: NOT DETECTED
Benzodiazepine, Ur Scrn: POSITIVE — AB
Cannabinoid 50 Ng, Ur ~~LOC~~: NOT DETECTED
Cocaine Metabolite,Ur ~~LOC~~: NOT DETECTED
MDMA (Ecstasy)Ur Screen: NOT DETECTED
Methadone Scn, Ur: NOT DETECTED
Opiate, Ur Screen: NOT DETECTED
Phencyclidine (PCP) Ur S: NOT DETECTED
Tricyclic, Ur Screen: NOT DETECTED

## 2021-12-07 LAB — URINALYSIS, ROUTINE W REFLEX MICROSCOPIC
Bacteria, UA: NONE SEEN
Bilirubin Urine: NEGATIVE
Glucose, UA: NEGATIVE mg/dL
Hgb urine dipstick: NEGATIVE
Ketones, ur: 20 mg/dL — AB
Leukocytes,Ua: NEGATIVE
Nitrite: NEGATIVE
Protein, ur: 100 mg/dL — AB
Specific Gravity, Urine: 1.021 (ref 1.005–1.030)
pH: 6 (ref 5.0–8.0)

## 2021-12-07 LAB — CBC WITH DIFFERENTIAL/PLATELET
Abs Immature Granulocytes: 0.05 10*3/uL (ref 0.00–0.07)
Basophils Absolute: 0 10*3/uL (ref 0.0–0.1)
Basophils Relative: 0 %
Eosinophils Absolute: 0.1 10*3/uL (ref 0.0–0.5)
Eosinophils Relative: 1 %
HCT: 37 % — ABNORMAL LOW (ref 39.0–52.0)
Hemoglobin: 12.7 g/dL — ABNORMAL LOW (ref 13.0–17.0)
Immature Granulocytes: 1 %
Lymphocytes Relative: 15 %
Lymphs Abs: 1.5 10*3/uL (ref 0.7–4.0)
MCH: 30.7 pg (ref 26.0–34.0)
MCHC: 34.3 g/dL (ref 30.0–36.0)
MCV: 89.4 fL (ref 80.0–100.0)
Monocytes Absolute: 0.9 10*3/uL (ref 0.1–1.0)
Monocytes Relative: 9 %
Neutro Abs: 7.8 10*3/uL — ABNORMAL HIGH (ref 1.7–7.7)
Neutrophils Relative %: 74 %
Platelets: 246 10*3/uL (ref 150–400)
RBC: 4.14 MIL/uL — ABNORMAL LOW (ref 4.22–5.81)
RDW: 12.5 % (ref 11.5–15.5)
WBC: 10.4 10*3/uL (ref 4.0–10.5)
nRBC: 0 % (ref 0.0–0.2)

## 2021-12-07 LAB — CBG MONITORING, ED: Glucose-Capillary: 101 mg/dL — ABNORMAL HIGH (ref 70–99)

## 2021-12-07 LAB — VALPROIC ACID LEVEL: Valproic Acid Lvl: 64 ug/mL (ref 50.0–100.0)

## 2021-12-07 LAB — SALICYLATE LEVEL: Salicylate Lvl: <7 mg/dL — ABNORMAL LOW (ref 7.0–30.0)

## 2021-12-07 LAB — TSH: TSH: 1.921 u[IU]/mL (ref 0.350–4.500)

## 2021-12-07 LAB — RESP PANEL BY RT-PCR (FLU A&B, COVID) ARPGX2
Influenza A by PCR: NEGATIVE
Influenza B by PCR: NEGATIVE
SARS Coronavirus 2 by RT PCR: NEGATIVE

## 2021-12-07 LAB — ETHANOL: Alcohol, Ethyl (B): <10 mg/dL (ref <10)

## 2021-12-07 LAB — ACETAMINOPHEN LEVEL: Acetaminophen (Tylenol), Serum: <10 ug/mL — ABNORMAL LOW (ref 10–30)

## 2021-12-07 LAB — T4, FREE: Free T4: 1.08 ng/dL (ref 0.61–1.12)

## 2021-12-07 MED ORDER — SODIUM CHLORIDE 0.9 % IV SOLN
200.0000 mg | INTRAVENOUS | Status: AC
  Administered 2021-12-07: 200 mg via INTRAVENOUS
  Filled 2021-12-07: qty 20

## 2021-12-07 MED ORDER — CHLORHEXIDINE GLUCONATE 0.12% ORAL RINSE (MEDLINE KIT)
15.0000 mL | Freq: Two times a day (BID) | OROMUCOSAL | Status: DC
  Administered 2021-12-07 – 2021-12-11 (×7): 15 mL via OROMUCOSAL
  Filled 2021-12-07: qty 15

## 2021-12-07 MED ORDER — LEVETIRACETAM IN NACL 1000 MG/100ML IV SOLN
1000.0000 mg | INTRAVENOUS | Status: AC
  Administered 2021-12-07: 1000 mg via INTRAVENOUS
  Filled 2021-12-07: qty 100

## 2021-12-07 MED ORDER — ACETAMINOPHEN 650 MG RE SUPP: 650.0000 mg | RECTAL | Status: DC | PRN

## 2021-12-07 MED ORDER — VALPROATE SODIUM 100 MG/ML IV SOLN
500.0000 mg | INTRAVENOUS | Status: AC
  Administered 2021-12-07: 500 mg via INTRAVENOUS
  Filled 2021-12-07: qty 5

## 2021-12-07 MED ORDER — POLYETHYLENE GLYCOL 3350 17 G PO PACK
17.0000 g | PACK | Freq: Every day | ORAL | Status: DC | PRN
  Administered 2021-12-09: 17 g via ORAL
  Filled 2021-12-07 (×2): qty 1

## 2021-12-07 MED ORDER — SODIUM CHLORIDE 0.9 % IV SOLN
75.0000 mL/h | INTRAVENOUS | Status: DC
  Administered 2021-12-07 – 2021-12-08 (×2): 75 mL/h via INTRAVENOUS

## 2021-12-07 MED ORDER — VALPROATE SODIUM 100 MG/ML IV SOLN
250.0000 mg | Freq: Two times a day (BID) | INTRAVENOUS | Status: DC
  Administered 2021-12-08: 250 mg via INTRAVENOUS
  Filled 2021-12-07: qty 2.5

## 2021-12-07 MED ORDER — SODIUM CHLORIDE 0.9 % IV SOLN
100.0000 mg | Freq: Two times a day (BID) | INTRAVENOUS | Status: DC
  Administered 2021-12-08: 100 mg via INTRAVENOUS
  Filled 2021-12-07 (×2): qty 10

## 2021-12-07 MED ORDER — ACETAMINOPHEN 325 MG PO TABS: 650.0000 mg | ORAL_TABLET | ORAL | Status: DC | PRN

## 2021-12-07 MED ORDER — CARVEDILOL 6.25 MG PO TABS
3.1250 mg | ORAL_TABLET | Freq: Two times a day (BID) | ORAL | Status: DC
  Administered 2021-12-09 – 2021-12-11 (×5): 3.125 mg via ORAL
  Filled 2021-12-07 (×5): qty 1

## 2021-12-07 MED ORDER — LORAZEPAM 2 MG/ML IJ SOLN: 4.0000 mg | INTRAMUSCULAR | Status: DC | PRN

## 2021-12-07 MED ORDER — DOCUSATE SODIUM 100 MG PO CAPS
100.0000 mg | ORAL_CAPSULE | Freq: Two times a day (BID) | ORAL | Status: DC
  Administered 2021-12-08 – 2021-12-11 (×6): 100 mg via ORAL
  Filled 2021-12-07 (×6): qty 1

## 2021-12-07 MED ORDER — SODIUM CHLORIDE 0.9 % IV SOLN: INTRAVENOUS | Status: DC

## 2021-12-07 MED ORDER — ORAL CARE MOUTH RINSE
15.0000 mL | OROMUCOSAL | Status: DC
  Administered 2021-12-07 – 2021-12-09 (×18): 15 mL via OROMUCOSAL
  Filled 2021-12-07 (×4): qty 15

## 2021-12-07 MED ORDER — RIVAROXABAN 20 MG PO TABS
20.0000 mg | ORAL_TABLET | Freq: Every day | ORAL | Status: DC
  Administered 2021-12-09 – 2021-12-11 (×3): 20 mg via ORAL
  Filled 2021-12-07 (×3): qty 1

## 2021-12-07 MED ORDER — ONDANSETRON HCL 4 MG/2ML IJ SOLN
4.0000 mg | Freq: Four times a day (QID) | INTRAMUSCULAR | Status: DC | PRN
  Administered 2021-12-07: 4 mg via INTRAVENOUS
  Filled 2021-12-07: qty 2

## 2021-12-07 MED ORDER — ONDANSETRON HCL 4 MG PO TABS: 4.0000 mg | ORAL_TABLET | Freq: Four times a day (QID) | ORAL | Status: DC | PRN

## 2021-12-07 MED ORDER — ATORVASTATIN CALCIUM 80 MG PO TABS
80.0000 mg | ORAL_TABLET | Freq: Every day | ORAL | Status: DC
  Administered 2021-12-09 – 2021-12-11 (×3): 80 mg via ORAL
  Filled 2021-12-07 (×3): qty 1

## 2021-12-07 MED ORDER — LEVETIRACETAM IN NACL 1000 MG/100ML IV SOLN
1000.0000 mg | Freq: Two times a day (BID) | INTRAVENOUS | Status: DC
  Administered 2021-12-08: 1000 mg via INTRAVENOUS
  Filled 2021-12-07 (×2): qty 100

## 2021-12-07 MED ORDER — SODIUM CHLORIDE 0.9 % IV BOLUS
1000.0000 mL | Freq: Once | INTRAVENOUS | Status: AC
  Administered 2021-12-07: 1000 mL via INTRAVENOUS

## 2021-12-07 NOTE — ED Provider Notes (Signed)
Patient was signed out to me at 3 PM pending reassessment.  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  Patient received 2 mg of Versed by EMS as patient was still seizing when they arrived.  Seizure resolved by the time of arrival to the ED.  Patient was given Keppra Vimpat and Depakote IV.  CT head negative Labs overall reassuring notable for sodium 129 Depakote level is therapeutic.  Plan is to observe until he returns to baseline. ? ?Patient was reassessed multiple times during my shift.  He is improving in terms of mental status however he still continues to be altered is unable to talk repeatedly trying to get out of bed.  His vital signs remained stable and he is protecting his airway and overall redirectable.  Does not appear to be in nonconvulsive status as he does make eye contact does not have any abnormal movements.  Suspect prolonged postictal period given he has been observed for almost 7 and half hours without return to baseline I will admit him to the hospitalist.  Discussed with hospitalist for admission ?  ?Georga Hacking, MD ?12/07/21 2051 ? ?

## 2021-12-07 NOTE — Assessment & Plan Note (Signed)
Stable continue lipitor, coreg and xarelto.  ?

## 2021-12-07 NOTE — ED Notes (Signed)
Spoke w/ pt's wife at [phone] and updated on pt's status .  ?Pt kept NPO , unable to do dycphagia screen as pt still confused. Responds to voice/ tactile stimuli.  ?Physician at bedside  ?

## 2021-12-07 NOTE — H&P (Addendum)
?History and Physical  ? ? ?Patient: Corey Meyers YBO:[id number] DOB: [birth year] ?DOA: 12/07/2021 ?DOS: the patient was seen and examined on 12/07/2021 ?PCP: Lauro Regulus, MD  ?Patient coming from: home. ? ?Chief Complaint:  ?Chief Complaint  ?Patient presents with  ? Seizures  ?  Ems arrived , post seizure , pt not responding , managing airway at this time , 2 versed given by ems   ? ?HPI: ELIYOHU CLASS is a 71 y.o. male with medical history significant of seizures d/o , h/o cva and htn coming to  [place] with seizures. Pt witnessed having seizures and given versed 2 mg via ems. Pt is noted to have h/o seizures due to his stroke. 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 ?Pt on my exam is cooperative but nonverbal and not able to follow commands completely, "will open his mouth but not stick out his tongue"- when asked during exam.  ?In ed pt has elevated anion gap which is most likely from his seizures.  ? ?Review of Systems: Review of Systems  ?Unable to perform ROS: Patient nonverbal  ? ?Past Medical History:  ?Diagnosis Date  ? Hyperlipidemia   ? Hypertension   ? Stroke [facility])   ? ?Past Surgical History:  ?Procedure Laterality Date  ? IR CT HEAD LTD  12/21/2018  ? IR PERCUTANEOUS ART THROMBECTOMY/INFUSION INTRACRANIAL INC DIAG ANGIO  12/21/2018  ? LAMINECTOMY    ? RADIOLOGY WITH ANESTHESIA N/A 12/21/2018  ? Procedure: RADIOLOGY WITH ANESTHESIA;  Surgeon: Julieanne Cotton, MD;  Location: [facility] OR;  Service: Radiology;  Laterality: N/A;  ? TONSILLECTOMY    ? ?Social History:  reports that he has quit smoking. He has never used smokeless tobacco. He reports current alcohol use. No history on file for drug use. ? ?No Known Allergies ? ?History reviewed. No pertinent family history. ? ?Prior to Admission medications   ?Medication Sig Start Date End Date Taking? Authorizing Provider   ?aspirin-sod bicarb-citric acid (ALKA-SELTZER) 325 MG TBEF tablet Take 325 mg by mouth every 6 (six) hours as needed (indigestion).    [provider]  ?atorvastatin (LIPITOR) 80 MG tablet Take 80 mg by mouth daily.    [provider]  ?carvedilol (COREG) 6.25 MG tablet Take 6.25 mg by mouth 2 (two) times daily with a meal.    [provider]  ?divalproex (DEPAKOTE) 250 MG DR tablet Take 3 tablets (750 mg total) by mouth 2 (two) times daily. 04/26/21   Burnadette Pop, MD  ?lacosamide (VIMPAT) 200 MG TABS tablet Take 1 tablet (200 mg total) by mouth 2 (two) times daily. 04/26/21   Burnadette Pop, MD  ?levETIRAcetam (KEPPRA) 750 MG tablet Take 2 tablets (1,500 mg total) by mouth 2 (two) times daily. 04/26/21   Burnadette Pop, MD  ?Multiple Vitamin (MULTIVITAMIN WITH MINERALS) TABS tablet Take 1 tablet by mouth daily.    [provider]  ?rivaroxaban (XARELTO) 20 MG TABS tablet Take 20 mg by mouth daily.    [provider]  ? ? ?Physical Exam: ?Vitals:  ? 12/07/21 1900 12/07/21 2000 12/07/21 2100 12/07/21 2130  ?BP: 111/69   125/74  ?Pulse: 90 81 88 82  ?Resp: 12 11 16 15   ?Temp: 98.5 ?F (36.9 ?C)     ?TempSrc:      ?SpO2: 95% 94% 96% 97%  ?Weight:      ?Height:      ?  Physical Exam ?Vitals and nursing note reviewed.  ?Constitutional:   ?   General: He is not in acute distress. ?   Appearance: Normal appearance. He is not ill-appearing, toxic-appearing or diaphoretic.  ?HENT:  ?   Head: Normocephalic and atraumatic.  ?   Right Ear: Hearing and external ear normal.  ?   Left Ear: Hearing and external ear normal.  ?   Nose: Nose normal. No nasal deformity.  ?   Mouth/Throat:  ?   Lips: Pink.  ?   Mouth: Mucous membranes are moist.  ?   Tongue: No lesions.  ?   Pharynx: Oropharynx is clear.  ?Eyes:  ?   Extraocular Movements: Extraocular movements intact.  ?   Pupils: Pupils are equal, round, and reactive to light.  ?Cardiovascular:  ?   Rate and Rhythm: Normal rate and regular  rhythm.  ?   Pulses: Normal pulses.  ?   Heart sounds: Normal heart sounds.  ?Pulmonary:  ?   Effort: Pulmonary effort is normal.  ?   Breath sounds: Normal breath sounds.  ?Abdominal:  ?   General: Bowel sounds are normal. There is no distension.  ?   Palpations: Abdomen is soft. There is no mass.  ?   Tenderness: There is no abdominal tenderness. There is no guarding.  ?   Hernia: No hernia is present.  ?Musculoskeletal:  ?   Right lower leg: No edema.  ?   Left lower leg: No edema.  ?Skin: ?   General: Skin is warm.  ?Neurological:  ?   General: No focal deficit present.  ?   Mental Status: He is alert. He is disoriented.  ?   Cranial Nerves: Cranial nerves 2-12 are intact.  ?   Motor: Motor function is intact.  ?Psychiatric:     ?   Attention and Perception: Attention normal.     ?   Mood and Affect: Mood normal.     ?   Speech: Speech normal.     ?   Behavior: Behavior normal. Behavior is cooperative.     ?   Cognition and Memory: Cognition normal.  ? ? ?Data Reviewed: ?Results for orders placed or performed during the hospital encounter of 12/07/21 (from the past 24 hour(s))  ?Acetaminophen level     Status: Abnormal  ? Collection Time: 12/07/21  1:37 PM  ?Result Value Ref Range  ? Acetaminophen (Tylenol), Serum <10 (L) 10 - 30 ug/mL  ?Comprehensive metabolic panel     Status: Abnormal  ? Collection Time: 12/07/21  1:37 PM  ?Result Value Ref Range  ? Sodium 129 (L) 135 - 145 mmol/L  ? Potassium 3.7 3.5 - 5.1 mmol/L  ? Chloride 90 (L) 98 - 111 mmol/L  ? CO2 22 22 - 32 mmol/L  ? Glucose, Bld 99 70 - 99 mg/dL  ? BUN 13 8 - 23 mg/dL  ? Creatinine, Ser 0.86 0.61 - 1.24 mg/dL  ? Calcium 8.7 (L) 8.9 - 10.3 mg/dL  ? Total Protein 7.9 6.5 - 8.1 g/dL  ? Albumin 3.6 3.5 - 5.0 g/dL  ? AST 45 (H) 15 - 41 U/L  ? ALT 21 0 - 44 U/L  ? Alkaline Phosphatase 55 38 - 126 U/L  ? Total Bilirubin 0.6 0.3 - 1.2 mg/dL  ? GFR, Estimated >60 >60 mL/min  ? Anion gap 17 (H) 5 - 15  ?Ethanol     Status: None  ? Collection Time: 12/07/21   1:37  PM  ?Result Value Ref Range  ? Alcohol, Ethyl (B) <10 <10 mg/dL  ?Salicylate level     Status: Abnormal  ? Collection Time: 12/07/21  1:37 PM  ?Result Value Ref Range  ? Salicylate Lvl <7.0 (L) 7.0 - 30.0 mg/dL  ?CBC with Differential     Status: Abnormal  ? Collection Time: 12/07/21  1:37 PM  ?Result Value Ref Range  ? WBC 10.4 4.0 - 10.5 K/uL  ? RBC 4.14 (L) 4.22 - 5.81 MIL/uL  ? Hemoglobin 12.7 (L) 13.0 - 17.0 g/dL  ? HCT 37.0 (L) 39.0 - 52.0 %  ? MCV 89.4 80.0 - 100.0 fL  ? MCH 30.7 26.0 - 34.0 pg  ? MCHC 34.3 30.0 - 36.0 g/dL  ? RDW 12.5 11.5 - 15.5 %  ? Platelets 246 150 - 400 K/uL  ? nRBC 0.0 0.0 - 0.2 %  ? Neutrophils Relative % 74 %  ? Neutro Abs 7.8 (H) 1.7 - 7.7 K/uL  ? Lymphocytes Relative 15 %  ? Lymphs Abs 1.5 0.7 - 4.0 K/uL  ? Monocytes Relative 9 %  ? Monocytes Absolute 0.9 0.1 - 1.0 K/uL  ? Eosinophils Relative 1 %  ? Eosinophils Absolute 0.1 0.0 - 0.5 K/uL  ? Basophils Relative 0 %  ? Basophils Absolute 0.0 0.0 - 0.1 K/uL  ? Immature Granulocytes 1 %  ? Abs Immature Granulocytes 0.05 0.00 - 0.07 K/uL  ?Urinalysis, Routine w reflex microscopic Urine, In & Out Cath     Status: Abnormal  ? Collection Time: 12/07/21  1:37 PM  ?Result Value Ref Range  ? Color, Urine YELLOW (A) YELLOW  ? APPearance CLEAR (A) CLEAR  ? Specific Gravity, Urine 1.021 1.005 - 1.030  ? pH 6.0 5.0 - 8.0  ? Glucose, UA NEGATIVE NEGATIVE mg/dL  ? Hgb urine dipstick NEGATIVE NEGATIVE  ? Bilirubin Urine NEGATIVE NEGATIVE  ? Ketones, ur 20 (A) NEGATIVE mg/dL  ? Protein, ur 100 (A) NEGATIVE mg/dL  ? Nitrite NEGATIVE NEGATIVE  ? Leukocytes,Ua NEGATIVE NEGATIVE  ? RBC / HPF 0-5 0 - 5 RBC/hpf  ? WBC, UA 0-5 0 - 5 WBC/hpf  ? Bacteria, UA NONE SEEN NONE SEEN  ? Squamous Epithelial / LPF 0-5 0 - 5  ? Mucus PRESENT   ? Hyaline Casts, UA PRESENT   ?Urine Drug Screen, Qualitative     Status: Abnormal  ? Collection Time: 12/07/21  1:37 PM  ?Result Value Ref Range  ? Tricyclic, Ur Screen NONE DETECTED NONE DETECTED  ? Amphetamines, Ur  Screen NONE DETECTED NONE DETECTED  ? MDMA (Ecstasy)Ur Screen NONE DETECTED NONE DETECTED  ? Cocaine Metabolite,Ur [place] NONE DETECTED NONE DETECTED  ? Opiate, Ur Screen NONE DETECTED NONE DETECTED  ? Phencyclidine (PCP) U

## 2021-12-07 NOTE — Assessment & Plan Note (Signed)
Blood pressure 125/74, pulse 82, temperature 98.5 ?F (36.9 ?C), resp. rate 15, height 5\' 10"  (1.778 m), weight 90 kg, SpO2 97 %. ?cont coreg.  ?

## 2021-12-07 NOTE — Assessment & Plan Note (Signed)
Continue statin therapy and coreg and xarelto.  ?

## 2021-12-07 NOTE — ED Triage Notes (Signed)
Ems arrived , post seizure , pt not responding , managing airway at this time , 2 versed given by ems  ?

## 2021-12-07 NOTE — ED Provider Notes (Signed)
? ?[facility] ?Provider Note ? ? ? Event Date/Time  ? First MD Initiated Contact with Patient 12/07/21 1336   ?  (approximate) ? ? ?History  ? ?Seizures (Ems arrived , post seizure , pt not responding , managing airway at this time , 2 versed given by ems ) ? ? ?HPI ? ?Corey Meyers is a 71 y.o. 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  EMS arrived on scene, noted the patient still seizing, gave 2 mg of Versed with resolution of his convulsive activity. ? ?Reviewed EMR, patient takes Keppra, Vimpat, Depakote.  No known vomiting or fever.  Patient still confused on arrival. ?  ? ? ?Physical Exam  ? ?Triage Vital Signs: ?ED Triage Vitals  ?Enc Vitals Group  ?   BP 12/07/21 1334 118/81  ?   Pulse Rate 12/07/21 1334 89  ?   Resp 12/07/21 1334 17  ?   Temp 12/07/21 1334 98.5 ?F (36.9 ?C)  ?   Temp Source 12/07/21 1334 Axillary  ?   SpO2 12/07/21 1334 96 %  ?   Weight 12/07/21 1335 198 lb 6.6 oz (90 kg)  ?   Height 12/07/21 1335 5\' 10"  (1.778 m)  ?   Head Circumference --   ?   Peak Flow --   ?   Pain Score --   ?   Pain Loc --   ?   Pain Edu? --   ?   Excl. in GC? --   ? ? ?Most recent vital signs: ?Vitals:  ? 12/07/21 1334  ?BP: 118/81  ?Pulse: 89  ?Resp: 17  ?Temp: 98.5 ?F (36.9 ?C)  ?SpO2: 96%  ? ? ? ?General: Awake, no distress.  Unable to follow commands. ?CV:  Good peripheral perfusion.  Regular rate and rhythm ?Resp:  Normal effort.  Clear to auscultation bilaterally ?Abd:  No distention.  Soft nontender ?Other:  PERRL at 2 mm bilaterally.  Extraocular movements are wandering to left and right without fixed gaze or nystagmus.  Small abrasion on the inner lower lip without laceration, currently hemostatic.  Poor dentition but no apparent acute dental injury. ? ? ?ED Results / Procedures / Treatments  ? ?Labs ?(all labs ordered are listed, but only abnormal results are displayed) ?Labs Reviewed  ?ACETAMINOPHEN  LEVEL - Abnormal; Notable for the following components:  ?    Result Value  ? Acetaminophen (Tylenol), Serum <10 (*)   ? All other components within normal limits  ?COMPREHENSIVE METABOLIC PANEL - Abnormal; Notable for the following components:  ? Sodium 129 (*)   ? Chloride 90 (*)   ? Calcium 8.7 (*)   ? AST 45 (*)   ? Anion gap 17 (*)   ? All other components within normal limits  ?SALICYLATE LEVEL - Abnormal; Notable for the following components:  ? Salicylate Lvl <7.0 (*)   ? All other components within normal limits  ?CBC WITH DIFFERENTIAL/PLATELET - Abnormal; Notable for the following components:  ? RBC 4.14 (*)   ? Hemoglobin 12.7 (*)   ? HCT 37.0 (*)   ? Neutro Abs 7.8 (*)   ? All other components within normal limits  ?ETHANOL  ?VALPROIC ACID LEVEL  ?URINALYSIS, ROUTINE W REFLEX MICROSCOPIC  ?URINE DRUG SCREEN, QUALITATIVE ([facility] ONLY)  ? ? ? ?EKG ? ? ? ? ?RADIOLOGY ?CT head viewed and interpreted by me,  unremarkable.  Radiology report reviewed. ? ? ? ?PROCEDURES: ? ?Critical Care performed: No ? ?Procedures ? ? ?MEDICATIONS ORDERED IN ED: ?Medications  ?valproate (DEPACON) 500 mg in dextrose 5 % 50 mL IVPB (has no administration in time range)  ?sodium chloride 0.9 % bolus 1,000 mL (has no administration in time range)  ?levETIRAcetam (KEPPRA) IVPB 1000 mg/100 mL premix (0 mg Intravenous Stopped 12/07/21 1413)  ?lacosamide (VIMPAT) 200 mg in sodium chloride 0.9 % 25 mL IVPB (0 mg Intravenous Stopped 12/07/21 1441)  ? ? ? ?IMPRESSION / MDM / ASSESSMENT AND PLAN / ED COURSE  ?I reviewed the triage vital signs and the nursing notes. ?             ?               ? ?Differential diagnosis includes, but is not limited to, recurrent seizure, intracranial hemorrhage, electrolyte abnormality, medication noncompliance ? ?Patient presents with a seizure in the setting of known seizure disorder.  He has had previous MCA stroke, he is on Eliquis according to review of electronic medical record.  CT scan obtained which is  unremarkable.  Labs overall unremarkable except for a slightly low sodium level.  We will give saline bolus.  Patient is being given an IV infusion of his home medications Keppra Vimpat and Depakote. ? ? ?Clinical Course as of 12/07/21 1505  ?Tue Dec 07, 2021  ?1504 Patient now opens eyes to voice but still confused. [PS]  ?  ?Clinical Course User Index ?[PS] Sharman Cheek, MD  ? ? ? ?FINAL CLINICAL IMPRESSION(S) / ED DIAGNOSES  ? ?Final diagnoses:  ?Seizure (HCC)  ? ? ? ?Rx / DC Orders  ? ?ED Discharge Orders   ? ? None  ? ?  ? ? ? ?Note:  This document was prepared using Dragon voice recognition software and may include unintentional dictation errors. ?  ?Sharman Cheek, MD ?12/07/21 1449 ? ?

## 2021-12-07 NOTE — Assessment & Plan Note (Signed)
Attribute to dehydration and decreased p.o. intake. ?We will continue patient on maintenance IV fluid with normal saline at 50 cc x 1 day. ? ?

## 2021-12-07 NOTE — Assessment & Plan Note (Addendum)
CBC Latest Ref Rng & Units 12/07/2021 04/24/2021 04/23/2021  ?WBC 4.0 - 10.5 K/uL 10.4 9.5 8.4  ?Hemoglobin 13.0 - 17.0 g/dL 12.7(L) 13.5 14.3  ?Hematocrit 39.0 - 52.0 % 37.0(L) 39.3 42.6  ?Platelets 150 - 400 K/uL 246 297 309  ?mild anemia and new we will follow and address as pt is on xarelto.  ? ?

## 2021-12-07 NOTE — Assessment & Plan Note (Addendum)
Will admit patient to progressive unit with continuous cardiac monitoring. ?Aspiration, seizure, fall precautions. ?Pharmacy consulted to transition home p.o. medications to IV equivalent. ?Neurochecks.  Neurology consult order and a.m. message sent. ?We will obtain an MRI for concerns of CVA. ?

## 2021-12-08 ENCOUNTER — Inpatient Hospital Stay: Payer: Medicare HMO

## 2021-12-08 DIAGNOSIS — E871 Hypo-osmolality and hyponatremia: Secondary | ICD-10-CM

## 2021-12-08 DIAGNOSIS — I10 Essential (primary) hypertension: Secondary | ICD-10-CM

## 2021-12-08 DIAGNOSIS — R569 Unspecified convulsions: Secondary | ICD-10-CM | POA: Diagnosis not present

## 2021-12-08 LAB — LACTIC ACID, PLASMA
Lactic Acid, Venous: 0.8 mmol/L (ref 0.5–1.9)
Lactic Acid, Venous: 0.9 mmol/L (ref 0.5–1.9)

## 2021-12-08 LAB — COMPREHENSIVE METABOLIC PANEL
ALT: 19 U/L (ref 0–44)
AST: 28 U/L (ref 15–41)
Albumin: 3.3 g/dL — ABNORMAL LOW (ref 3.5–5.0)
Alkaline Phosphatase: 47 U/L (ref 38–126)
Anion gap: 8 (ref 5–15)
BUN: 9 mg/dL (ref 8–23)
CO2: 27 mmol/L (ref 22–32)
Calcium: 8.5 mg/dL — ABNORMAL LOW (ref 8.9–10.3)
Chloride: 93 mmol/L — ABNORMAL LOW (ref 98–111)
Creatinine, Ser: 0.51 mg/dL — ABNORMAL LOW (ref 0.61–1.24)
GFR, Estimated: >60 mL/min (ref >60)
Glucose, Bld: 103 mg/dL — ABNORMAL HIGH (ref 70–99)
Potassium: 3.8 mmol/L (ref 3.5–5.1)
Sodium: 128 mmol/L — ABNORMAL LOW (ref 135–145)
Total Bilirubin: 0.9 mg/dL (ref 0.3–1.2)
Total Protein: 7.1 g/dL (ref 6.5–8.1)

## 2021-12-08 LAB — CBC
HCT: 34.5 % — ABNORMAL LOW (ref 39.0–52.0)
Hemoglobin: 12.4 g/dL — ABNORMAL LOW (ref 13.0–17.0)
MCH: 30.8 pg (ref 26.0–34.0)
MCHC: 35.9 g/dL (ref 30.0–36.0)
MCV: 85.8 fL (ref 80.0–100.0)
Platelets: 210 10*3/uL (ref 150–400)
RBC: 4.02 MIL/uL — ABNORMAL LOW (ref 4.22–5.81)
RDW: 12.3 % (ref 11.5–15.5)
WBC: 17 10*3/uL — ABNORMAL HIGH (ref 4.0–10.5)
nRBC: 0 % (ref 0.0–0.2)

## 2021-12-08 LAB — AMMONIA: Ammonia: 18 umol/L (ref 9–35)

## 2021-12-08 LAB — HIV ANTIBODY (ROUTINE TESTING W REFLEX): HIV Screen 4th Generation wRfx: NONREACTIVE

## 2021-12-08 LAB — SODIUM: Sodium: 133 mmol/L — ABNORMAL LOW (ref 135–145)

## 2021-12-08 LAB — CK: Total CK: 202 U/L (ref 49–397)

## 2021-12-08 MED ORDER — LEVETIRACETAM IN NACL 1500 MG/100ML IV SOLN
1500.0000 mg | Freq: Two times a day (BID) | INTRAVENOUS | Status: DC
  Administered 2021-12-08 – 2021-12-09 (×2): 1500 mg via INTRAVENOUS
  Filled 2021-12-08 (×3): qty 100

## 2021-12-08 MED ORDER — SODIUM CHLORIDE 0.9 % IV SOLN
75.0000 mL/h | INTRAVENOUS | Status: DC
  Administered 2021-12-08 – 2021-12-09 (×3): 75 mL/h via INTRAVENOUS

## 2021-12-08 MED ORDER — VALPROATE SODIUM 100 MG/ML IV SOLN
750.0000 mg | Freq: Every day | INTRAVENOUS | Status: DC
  Administered 2021-12-08: 750 mg via INTRAVENOUS
  Filled 2021-12-08: qty 7.5

## 2021-12-08 MED ORDER — LORAZEPAM 2 MG/ML IJ SOLN: 1.0000 mg | INTRAMUSCULAR | Status: DC | PRN

## 2021-12-08 MED ORDER — VALPROATE SODIUM 100 MG/ML IV SOLN
500.0000 mg | Freq: Every morning | INTRAVENOUS | Status: DC
  Administered 2021-12-09: 500 mg via INTRAVENOUS
  Filled 2021-12-08: qty 5

## 2021-12-08 NOTE — Procedures (Addendum)
Patient Name: Corey Meyers  ?MRN: [id number]  ?Epilepsy Attending: [name]  ?Referring Physician/Provider: Gertha Calkin, MD ?Date: 12/08/2021 ?Duration: 25.59 mins ? 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 EEG to evaluate for seizure ? ?Level of alertness: Awake ? ?AEDs during EEG study: LEV, VPAA ? ?Technical aspects: This EEG study was done with scalp electrodes positioned according to the 10-20 International system of electrode placement. Electrical activity was acquired at a sampling rate of 500Hz  and reviewed with a high frequency filter of 70Hz  and a low frequency filter of 1Hz . EEG data were recorded continuously and digitally stored.  ? ?Description: The posterior dominant rhythm consists of 9 Hz activity of moderate voltage (25-35 uV) seen predominantly in posterior head regions, symmetric and reactive to eye opening and eye closing. EEG showed continuous 3 to 6 Hz theta-delta slowing in left hemisphere, maximal left fronto-temporal region. Abundant spikes were noted in left fronto-temporal region. Photic driving was not seen during photic stimulation.  Hyperventilation was not performed.    ? ?ABNORMALITY ?- Spike, left fronto-temporal region ?- Continuous slow, left hemisphere, maximal left fronto-temporal region ? ?IMPRESSION: ?This study showed evidence of epileptogenicity and cortical dysfunction arising from left fronto-temporal region likely due to underlying stroke.  ?No seizures were seen throughout the recording. ? ?  ? ?

## 2021-12-08 NOTE — Evaluation (Signed)
Clinical/Bedside Swallow Evaluation ?Patient Details  ?Name: Corey Meyers ?MRN: [id number] ?Date of Birth: 1950-09-30 ? ?Today's Date: 12/08/2021 ?Time: SLP Start Time (ACUTE ONLY): 1630 SLP Stop Time (ACUTE ONLY): 1730 ?SLP Time Calculation (min) (ACUTE ONLY): 60 min ? ?Past Medical History:  ?Past Medical History:  ?Diagnosis Date  ? Hyperlipidemia   ? Hypertension   ? Stroke [facility])   ? ?Past Surgical History:  ?Past Surgical History:  ?Procedure Laterality Date  ? IR CT HEAD LTD  12/21/2018  ? IR PERCUTANEOUS ART THROMBECTOMY/INFUSION INTRACRANIAL INC DIAG ANGIO  12/21/2018  ? LAMINECTOMY    ? RADIOLOGY WITH ANESTHESIA N/A 12/21/2018  ? Procedure: RADIOLOGY WITH ANESTHESIA;  Surgeon: Julieanne Cotton, MD;  Location: [facility] OR;  Service: Radiology;  Laterality: N/A;  ? TONSILLECTOMY    ? ?HPI:  ?Pt is a 71 y.o. 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 Had a seizure on the way to the ED for which he was given Versed which broke the seizure. Pt was admitted to the hospital because he was not back to his baseline. Wife who reports that after his stroke in 2020, his speech had improved to the point that he could communicate in sentences but after he had the first seizure in 2021, he had off-and-on days of sporadic speech deficits.  After the second and third seizure, his speech took longer to return. She reports that at baseline, he is able to follow commands and can "have a conversation decently but is not completely normal".    ?MRI: No acute intracranial  abnormality.  2. 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  ? ?  ?Assessment / Plan / Recommendation  ?Clinical Impression ? 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 No Motor Speech deficits were noted then. Pt had f/u w/ Rehab post discharge from hospital at that time, and his language skills improved.  ?Wife reported to Neurologist and to this Clinician that pt's language skills declined post each Seizure in the past ~2 years but that he is able to verbally communicate "well enough on most days but still cannot get his words out sometimes". Pt currently presents w/ both Receptive and Expressive Aphasia impacting his follow through w/ instructions, answering y/n questions, and follow through w/ information given at this eval. Noted spontaneous speech (at phrase level) w/ frustration about wearing the Mitts but difficulty w/ volitional responses. Reducing distractions appears to help settle pt's behavior/frustration. ?Recommend monitoring his recovery of speech during this admit; Wife stated to this Clinician and to Neurologist that "it took about 6 days for it to come back last time". Skilled ST services can be ordered for Home Health or Outpatient therapy if needed as he discharges.  ? ?During this BSE, pt was eager to have the Mitts removed to help feed himself. With given support for positioning, setup w/ po's then Supervision, pt appears to present w/ adequate oropharyngeal phase swallow w/ No gross oropharyngeal phase  dysphagia noted. Pt consumed po trials w/ No overt, clinical s/s of aspiration during po trials.  ?Pt appears at reduced risk for aspiration when following general aspiration precautions' given setup support at meals.    ? ?During po trials, pt consumed all consistencies w/ no overt coughing, decline in vocal quality, or  change in respiratory presentation during/post trials. O2 sats remained in upper 90s. Pt held Cup and sucked via straw consuming ~8 ozs of thin liquids; min impulsively but he said "yes" when asked if thirsty. Education given on slowing down when drinking/eating to lessen risk for choking. Oral phase appeared [facility] w/ timely bolus management, mastication, and control of bolus propulsion for A-P transfer for swallowing. Oral clearing achieved w/ all trial consistencies. OM Exam appeared [facility] w/ no unilateral weakness noted. Gag+. Speech Clear w/ few words/spontaneous phrase. Pt fed self w/ setup support.  ? ?Recommend a Regular diet w/ Cut (tougher) meats, more finger foods; Thin liquids. Recommend general aspiration precautions. Reduce distractions during meals. Give setup support at meals; positioning. Pills WHOLE in Puree for safer, easier swallowing if needed per NSG judgement (based on pt's alertness). Education given on Pills in Puree; food consistencies and easy to eat options; general aspiration precautions w/ Wife via TC after evaluation. Updated MD/NSG. MD to reconsult if new swallowing needs arise during admit.  ?SLP Visit Diagnosis: Dysphagia, unspecified (R13.10) (appears at baseline per Wife's report and presentation) ?   ?Aspiration Risk ?  (reduced following precautions)  ?  ?Diet Recommendation   Regular diet w/ Cut (tougher) meats, more finger foods; Thin liquids. Recommend general aspiration precautions. Reduce distractions during meals. Give setup support at meals; positioning.  ? ?Medication Administration: Whole meds with puree (as needed for safer swallowing)  ?  ?Other  Recommendations Recommended Consults:  (none) ?Oral Care Recommendations: Oral care BID;Oral care before and after PO;Staff/trained caregiver to provide oral care (support) ?Other Recommendations:  (n/a)   ? ?Recommendations for follow up therapy are one component of a multi-disciplinary discharge planning process, led by the  attending physician.  Recommendations may be updated based on patient status, additional functional criteria and insurance authorization. ? ?Follow up Recommendations No SLP follow up  ? ? ?  ?Assistance Recommended at Discharge Intermittent Supervision/Assistance  ?Functional Status Assessment Patient has had a recent decline in their functional status and demonstrates the ability to make significant improvements in function in a reasonable and predictable amount of time.  ?Frequency and Duration  (n/a)  ? (n/a) ?  ?   ? ?Prognosis Prognosis for Safe Diet Advancement: Good ?Barriers to Reach Goals: Cognitive deficits;Language deficits;Time post onset;Severity of deficits;Behavior ?Barriers/Prognosis Comment: min impulsive; decreased awareness  ? ?  ? ?Swallow Study   ?General Date of Onset: 12/07/21 ?HPI: Pt is a 71 y.o. 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 Had a seizure on the way to the ED for which he was given Versed which broke the seizure. Pt was admitted to the hospital because he was not back to his baseline. Wife who reports that after his stroke in 2020,  his speech had improved to the point that he could communicate in sentences but after he had the first seizure in 2021, he had off-and-on days of sporadic speech deficits.  After the second and third seizure, his speech took longer to return. She reports that at baseline, he is able to follow commands and can "have a conversation decently but is not completely normal".   MRI: No  acute intracranial abnormality.  2. 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 ?Type of Study: Bedside Swallow Evaluation ?Previous Swallow Assessment: BSEs 2022, 2021, 2020 - regular diet w/ thins last recom

## 2021-12-08 NOTE — Consult Note (Addendum)
Neurology Consultation ? ?Reason for Consult: Seizure ?Referring Physician: Dr. Girard Cooter ? ?CC: Seizure ? ?History is obtained from: Chart, patient's wife over the phone ? ?HPI: Corey Meyers is a 71 y.o. 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  Also had a seizure on the way for which she was given Versed which broke the seizure. ?Admitted because he was not back to his baseline-still not talking much. ?I spoke to the wife who reports that after his stroke in 2020, his speech had improved to the point that he could talk in full sentences but after he had the first seizure in 2021, he had off-and-on days of sporadic speech deficits.  After the second and third seizure, his speech took longer to return after the seizure up to about a week when the last time he had a seizure. ?She reports that at baseline he is able to follow commands and have a conversation decently but is not completely normal. ? ?On my examination this morning, patient unable to provide any history.  To most questions, he says yes and one-word and is not able to follow commands or repeat. ? ?Outpatient neurologist: Dr. Lyndel Pleasure.  Last visit 10/19/2021 ?Current home antiepileptics: Depakote 500 mg in the morning and 750 mg at night.  Keppra 1000 mg in the morning and 1500 mg at night.  Depakote was decreased due to increased daytime somnolence which was reported at that visit. ?Medication list also documents Vimpat but he is not taking it-not sure if it was prescribed at this  visit or was used during last hospitalizations.  Last outpatient note also does not mention patient being on Vimpat. ?Stroke prevention medication: Xarelto ? ?Valproate level in the ED yesterday: 64 ? ?LKW: Sometime yesterday ?tpa given?: no, seizure-no stroke on MRI ?Premorbid modified Rankin scale (mRS): 2 ? ?ROS: Unable to obtain due to altered mental status.  ? ?Past Medical History:  ?Diagnosis Date  ? Hyperlipidemia   ? Hypertension   ? Stroke [facility])   ? ?History reviewed. No pertinent family history. ? ?Social History:  ? reports that he has quit smoking. He has never used smokeless tobacco. He reports current alcohol use. No history on file for drug use. ? ?Medications ? ?Current Facility-Administered Medications:  ?  0.9 %  sodium chloride infusion, 75 mL/hr, Intravenous, Continuous, Para Skeans, MD, Last Rate: 75 mL/hr at 12/08/21 0111, 75 mL/hr at 12/08/21 0111 ?  acetaminophen (TYLENOL) tablet 650 mg, 650 mg, Oral, Q4H PRN **OR** acetaminophen (TYLENOL) suppository 650 mg, 650 mg, Rectal, Q4H PRN, Para Skeans, MD ?  atorvastatin (LIPITOR) tablet 80 mg, 80 mg, Oral, Daily, Posey Pronto, Ekta V, MD ?  carvedilol (COREG) tablet 3.125 mg, 3.125 mg, Oral, BID WC, Patel, Ekta V, MD ?  chlorhexidine gluconate (MEDLINE KIT) (PERIDEX) 0.12 % solution 15 mL, 15 mL, Mouth Rinse, BID, Florina Ou V, MD, 15 mL at 12/08/21 6073 ?  docusate sodium (COLACE) capsule 100 mg, 100 mg, Oral, BID, Para Skeans, MD ?  lacosamide (VIMPAT) 100 mg in sodium chloride 0.9 %  25 mL IVPB, 100 mg, Intravenous, Q12H, Para Skeans, MD, Last Rate: 70 mL/hr at 12/08/21 0202, 100 mg at 12/08/21 0202 ?  levETIRAcetam (KEPPRA) IVPB 1000 mg/100 mL premix, 1,000 mg, Intravenous, Q12H, Para Skeans, MD, Last Rate: 400 mL/hr at 12/08/21 0115, 1,000 mg at 12/08/21 0115 ?  LORazepam (ATIVAN) injection 4 mg, 4 mg, Intravenous, Q5 Min x 2 PRN, Para Skeans, MD ?  MEDLINE mouth rinse, 15 mL, Mouth Rinse, 10 times per day, Para Skeans, MD, 15 mL at  12/08/21 5102 ?  ondansetron (ZOFRAN) tablet 4 mg, 4 mg, Oral, Q6H PRN **OR** ondansetron (ZOFRAN) injection 4 mg, 4 mg, Intravenous, Q6H PRN, Para Skeans, MD, 4 mg at 12/07/21 2144 ?  polyethylene glycol (MIRALAX / GLYCOLAX) packet 17 g, 17 g, Oral, Daily PRN, Para Skeans, MD ?  rivaroxaban (XARELTO) tablet 20 mg, 20 mg, Oral, Daily, Florina Ou V, MD ?  valproate (DEPACON) 250 mg in dextrose 5 % 50 mL IVPB, 250 mg, Intravenous, Q12H, Para Skeans, MD, Last Rate: 52.5 mL/hr at 12/08/21 0419, 250 mg at 12/08/21 0419 ? ?Exam: ?Current vital signs: ?BP 104/63   Pulse 75   Temp 98.4 ?F (36.9 ?C) (Oral)   Resp 16   Ht _0  (1.778 m)   Wt 90 kg   SpO2 94%   BMI 28.47 kg/m?  ?Vital signs in last 24 hours: ?Temp:  [97.9 ?F (36.6 ?C)-98.6 ?F (37 ?C)] 98.4 ?F (36.9 ?C) (03/22 0818) ?Pulse Rate:  [70-90] 75 (03/22 0818) ?Resp:  [11-18] 16 (03/22 0243) ?BP: (101-125)/(63-81) 104/63 (03/22 0818) ?SpO2:  [94 %-100 %] 94 % (03/22 0818) ?Weight:  [90 kg] 90 kg (03/21 1335) ?General: Patient was sleeping in bed, awakens to voice, no acute distress ?HEENT: Cephalic atraumatic ?CVs: Regular rhythm ?Respiratory: Breathing well saturating normally on room air ?Abdomen nondistended nontender ?Extremities warm well perfused ?Neurologic exam ?Once woken from sleep, he is awake alert is able to track the examiner.  Mimics some but only has minimal verbal output with the word "yes". ?Does not repeat ?Does not name objects ?Unable to follow simple or complex commands. ?No gross dysarthria ?Cranial nerves: Pupils are equal round react light, extraocular movements appear intact, visual fields are full, face appears grossly symmetric, tongue and palate are midline. ?Motor examination with symmetric antigravity strength in all fours ?Sensation intact to light touch without focality ?Coordination difficult to assess given his ability to follow commands. ? ? ?Labs ?I have reviewed labs in epic and the results pertinent to this  consultation are: ? ?CBC ?   ?Component Value Date/Time  ? WBC 17.0 (H) 12/08/2021 0221  ? RBC 4.02 (L) 12/08/2021 0221  ? HGB 12.4 (L) 12/08/2021 0221  ? HGB 13.1 02/12/2014 0210  ? HCT 34.5 (L) 12/08/2021 0221  ? HCT 38.7 (L) 02/12/2014 0210  ? PLT 210 12/08/2021 0221  ? PLT 291 02/12/2014 0210  ? MCV 85.8 12/08/2021 0221  ? MCV 89 02/12/2014 0210  ? MCH 30.8 12/08/2021 0221  ? MCHC 35.9 12/08/2021 0221  ? RDW 12.3 12/08/2021 0221  ? RDW 14.4 02/12/2014 0210  ? LYMPHSABS 1.5 12/07/2021 1337  ? LYMPHSABS 1.9 02/12/2014 0210  ? MONOABS 0.9 12/07/2021 1337  ? MONOABS 1.7 (H) 02/12/2014 0210  ? EOSABS 0.1 12/07/2021 1337  ? EOSABS 0.0 02/12/2014 0210  ? BASOSABS 0.0 12/07/2021 1337  ? BASOSABS 0.1 02/12/2014 0210  ? ?CMP  ?   ?Component Value Date/Time  ?  NA 128 (L) 12/08/2021 0221  ? NA 135 (L) 02/12/2014 0210  ? K 3.8 12/08/2021 0221  ? K 3.5 02/12/2014 0210  ? CL 93 (L) 12/08/2021 0221  ? CL 102 02/12/2014 0210  ? CO2 27 12/08/2021 0221  ? CO2 26 02/12/2014 0210  ? GLUCOSE 103 (H) 12/08/2021 0221  ? GLUCOSE 128 (H) 02/12/2014 0210  ? BUN 9 12/08/2021 0221  ? BUN 11 02/12/2014 0210  ? CREATININE 0.51 (L) 12/08/2021 0221  ? CREATININE 0.73 02/12/2014 0210  ? CALCIUM 8.5 (L) 12/08/2021 0221  ? CALCIUM 8.4 (L) 02/12/2014 0210  ? PROT 7.1 12/08/2021 0221  ? PROT 6.4 02/12/2014 0210  ? ALBUMIN 3.3 (L) 12/08/2021 0221  ? ALBUMIN 3.0 (L) 02/12/2014 0210  ? AST 28 12/08/2021 0221  ? AST 336 (H) 02/12/2014 0210  ? ALT 19 12/08/2021 0221  ? ALT 57 02/12/2014 0210  ? ALKPHOS 47 12/08/2021 0221  ? ALKPHOS 65 02/12/2014 0210  ? BILITOT 0.9 12/08/2021 0221  ? BILITOT 0.5 02/12/2014 0210  ? GFRNONAA >60 12/08/2021 0221  ? GFRNONAA >60 02/12/2014 0210  ? GFRAA >60 05/20/2020 0527  ? GFRAA >60 02/12/2014 0210  ? ?Urinalysis negative for UTI ? ? ?Imaging ?I have reviewed the images obtained: ?MRI brain: No acute stroke.  Chronic left posterior MCA territory stroke. ? 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  Family provides history that with every subsequent breakthrough seizure that he has had his postictal period has been long - up to over a week - with his speec

## 2021-12-08 NOTE — Progress Notes (Signed)
Eeg done 

## 2021-12-08 NOTE — Progress Notes (Signed)
?PROGRESS NOTE ? ? ? ?Corey Meyers  [id number] DOB: 01/25/1951 DOA: 12/07/2021 ?PCP: Kirk Ruths, MD  ? ?Assessment & Plan: ?  ?Principal Problem: ?  Seizures ([place]) ?Active Problems: ?  Hyponatremia ?  Anemia ?  Essential hypertension ?  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  Chronic systolic CHF (congestive heart failure) ([place]) ? ?Seizures: continue on keppra & valproate as per neuro. IV ativan prn. Difficulty w/ talking post seizures in the past as per pt's wife. EEG ordered. Neuro following and recs apprec  ? ?Hyponatremia: labile. Repeat Na level ordered ? ?HTN: continue on coreg  ?  ?Hx of embolic CVA: involving left middle cerebral arter. LV thrombus s/p revascularization. Continue on xarelto, coreg, statin. MRI brain shows no acute intracranial abnormality  ? ?Chronic systolic CHF: continue on coreg, statin  ? ?Leukocytosis: likely reactive. Will continue to monitor  ? ?DVT prophylaxis: xarelto  ?Code Status: full  ?Family Communication: discussed pt's care w/ pt's wife, Suanne Marker, and answered her questions  ?Disposition Plan: depends on PT/OT recs  ? ?Level of care: Progressive ? ?Status is: Inpatient ?Remains inpatient appropriate because:  ? ? ? ?Consultants:  ?Neuro ? ?Procedures: ? ?Antimicrobials: ? ? ?Subjective: ?Pt was unable to answer questions appropriately  ? ?Objective: ?Vitals:  ? 12/07/21 2241 12/08/21 0020 12/08/21 0243 12/08/21 0818  ?BP: 112/71 104/64 101/66 104/63  ?Pulse: 77 71 70 75  ?Resp: 18 16 16    ?Temp: 98.2 ?F (36.8 ?C) 98.6 ?F (37 ?C) 97.9 ?F (36.6 ?C) 98.4 ?F (36.9 ?C)  ?TempSrc:    Oral  ?SpO2: 100% 100% 99% 94%  ?Weight:      ?Height:      ? ? ?Intake/Output Summary (Last 24 hours) at 12/08/2021 0824 ?Last data filed at 12/07/2021 1759 ?Gross per 24 hour  ?Intake 1187.5 ml  ?Output --  ?Net 1187.5 ml  ? ?Filed Weights  ? 12/07/21 1335  ?Weight: 90 kg  ? ? ?Examination: ? ?General exam: Appears calm but  uncomfortable  ?Respiratory system: Clear to auscultation. Respiratory effort normal. ?Cardiovascular system: S1 & S2 +. No rubs, gallops or clicks.  ?Gastrointestinal system: Abdomen is nondistended, soft and nontender. Normal bowel sounds heard. ?Central nervous system: Alert and awake.   ?Psychiatry: Judgement and insight appear poor. Flat mood and affect  ? ? ? ?Data Reviewed: I have personally reviewed following labs and imaging studies ? ?CBC: ?Recent Labs  ?Lab 12/07/21 ?1337 12/08/21 ?0221  ?WBC 10.4 17.0*  ?NEUTROABS 7.8*  --   ?HGB 12.7* 12.4*  ?HCT 37.0* 34.5*  ?MCV 89.4 85.8  ?PLT 246 210  ? ?Basic Metabolic Panel: ?Recent Labs  ?Lab 12/07/21 ?1337 12/08/21 ?0221  ?NA 129* 128*  ?K 3.7 3.8  ?CL 90* 93*  ?CO2 22 27  ?GLUCOSE 99 103*  ?BUN 13 9  ?CREATININE 0.86 0.51*  ?CALCIUM 8.7* 8.5*  ? ?GFR: ?Estimated Creatinine Clearance: 98.4 mL/min (A) (by C-G formula based on SCr of 0.51 mg/dL (L)). ?Liver Function Tests: ?Recent Labs  ?Lab 12/07/21 ?1337 12/08/21 ?0221  ?AST 45* 28  ?ALT 21 19  ?ALKPHOS 55 47  ?BILITOT 0.6 0.9  ?PROT 7.9 7.1  ?ALBUMIN 3.6 3.3*  ? ?No results for input(s): LIPASE, AMYLASE in the last 168 hours. ?No results for input(s): AMMONIA in the last 168 hours. ?Coagulation Profile: ?No results for input(s): INR, PROTIME in the last 168 hours. ?Cardiac Enzymes: ?Recent Labs  ?Lab 12/07/21 ?2339  ?  CKTOTAL 202  ? ?BNP (last 3 results) ?No results for input(s): PROBNP in the last 8760 hours. ?HbA1C: ?No results for input(s): HGBA1C in the last 72 hours. ?CBG: ?Recent Labs  ?Lab 12/07/21 ?2119  ?GLUCAP 101*  ? ?Lipid Profile: ?No results for input(s): CHOL, HDL, LDLCALC, TRIG, CHOLHDL, LDLDIRECT in the last 72 hours. ?Thyroid Function Tests: ?Recent Labs  ?  12/07/21 ?2145  ?TSH 1.921  ?FREET4 1.08  ? ?Anemia Panel: ?No results for input(s): VITAMINB12, FOLATE, FERRITIN, TIBC, IRON, RETICCTPCT in the last 72 hours. ?Sepsis Labs: ?Recent Labs  ?Lab 12/07/21 ?2339 12/08/21 ?0221  ?LATICACIDVEN  0.9 0.8  ? ? ?Recent Results (from the past 240 hour(s))  ?Resp Panel by RT-PCR (Flu A&B, Covid) Nasopharyngeal Swab     Status: None  ? Collection Time: 12/07/21  9:07 PM  ? Specimen: Nasopharyngeal Swab; Nasopharyngeal(NP) swabs in vial transport medium  ?Result Value Ref Range Status  ? SARS Coronavirus 2 by RT PCR NEGATIVE NEGATIVE Final  ?  Comment: (NOTE) ?SARS-CoV-2 target nucleic acids are NOT DETECTED. ? ?The SARS-CoV-2 RNA is generally detectable in upper respiratory ?specimens during the acute phase of infection. The lowest ?concentration of SARS-CoV-2 viral copies this assay can detect is ?138 copies/mL. A negative result does not preclude SARS-Cov-2 ?infection and should not be used as the sole basis for treatment or ?other patient management decisions. A negative result may occur with  ?improper specimen collection/handling, submission of specimen other ?than nasopharyngeal swab, presence of viral mutation(s) within the ?areas targeted by this assay, and inadequate number of viral ?copies(<138 copies/mL). A negative result must be combined with ?clinical observations, patient history, and epidemiological ?information. The expected result is Negative. ? ?Fact Sheet for Patients:  ?EntrepreneurPulse.com.au ? ?Fact Sheet for Healthcare Providers:  ?IncredibleEmployment.be ? ?This test is no t yet approved or cleared by the [place] FDA and  ?has been authorized for detection and/or diagnosis of SARS-CoV-2 by ?FDA under an Emergency Use Authorization (EUA). This EUA will remain  ?in effect (meaning this test can be used) for the duration of the ?COVID-19 declaration under Section 564(b)(1) of the Act, 21 ?U.S.C.section 360bbb-3(b)(1), unless the authorization is terminated  ?or revoked sooner.  ? ? ?  ? Influenza A by PCR NEGATIVE NEGATIVE Final  ? Influenza B by PCR NEGATIVE NEGATIVE Final  ?  Comment: (NOTE) ?The Xpert Xpress SARS-CoV-2/FLU/RSV plus assay is  intended as an aid ?in the diagnosis of influenza from Nasopharyngeal swab specimens and ?should not be used as a sole basis for treatment. Nasal washings and ?aspirates are unacceptable for Xpert Xpress SARS-CoV-2/FLU/RSV ?testing. ? ?Fact Sheet for Patients: ?EntrepreneurPulse.com.au ? ?Fact Sheet for Healthcare Providers: ?IncredibleEmployment.be ? ?This test is not yet approved or cleared by the [place] FDA and ?has been authorized for detection and/or diagnosis of SARS-CoV-2 by ?FDA under an Emergency Use Authorization (EUA). This EUA will remain ?in effect (meaning this test can be used) for the duration of the ?COVID-19 declaration under Section 564(b)(1) of the Act, 21 U.S.C. ?section 360bbb-3(b)(1), unless the authorization is terminated or ?revoked. ? ?Performed at [facility], [place], ?[place] [address] ?  ?  ? ? ? ? ? ?Radiology Studies: ?CT Head Wo Contrast ? ?Result Date: 12/07/2021 ?CLINICAL DATA:  Mental status change, unknown cause EXAM: CT HEAD WITHOUT CONTRAST TECHNIQUE: Contiguous axial images were obtained from the base of the skull through the vertex without intravenous contrast. RADIATION DOSE REDUCTION: This exam was performed according  to the departmental dose-optimization program which includes automated exposure control, adjustment of the mA and/or kV according to patient size and/or use of iterative reconstruction technique. COMPARISON:  MRI 04/22/2021.  CT head 04/21/2021. FINDINGS: Brain: No evidence of acute infarction, hemorrhage, hydrocephalus, extra-axial collection or mass lesion/mass effect. Remote left posterior MCA territory infarct with similar encephalomalacia. Vascular: No hyperdense vessel identified. Calcific intracranial atherosclerosis. Skull: No acute fracture. Sinuses/Orbits: Moderate mucosal thickening of scattered ethmoid air cells. Milder mucosal thickening of the inferior maxillary sinuses. No acute  orbital abnormality. Other: No mastoid effusions. IMPRESSION: 1. No evidence of acute intracranial abnormality. 2. Remote left posterior MCA territory infarct. 3. Paranasal sinus mucosal thickening. Electronically Sig

## 2021-12-08 NOTE — Progress Notes (Signed)
?  Transition of Care (TOC) Screening Note ? ? ?Patient Details  ?Name: Corey Meyers ?Date of Birth: [birth year] ? ? ?Transition of Care (TOC) CM/SW Contact:    ?Alberteen Sam, LCSW ?Phone Number: ?12/08/2021, 8:25 AM ? ?CSW notes patient from home with wife, presenting with seizures, MRI to be obtained and neuro consulted. ? ?Transition of Care Department [facility]) has reviewed patient and no TOC needs have been identified at this time. We will continue to monitor patient advancement through interdisciplinary progression rounds. If new patient transition needs arise, please place a TOC consult. ? ? ?Pricilla Riffle, [place] ?[phone] ? ? ?

## 2021-12-08 NOTE — Plan of Care (Signed)
?  Problem: Education: ?Goal: Expressions of having a comfortable level of knowledge regarding the disease process will increase ?Outcome: Progressing ?  ?Problem: Coping: ?Goal: Ability to adjust to condition or change in health will improve ?Outcome: Progressing ?  ?Problem: Coping: ?Goal: Ability to identify appropriate support needs will improve ?Outcome: Completed/Met ?  ?Problem: Health Behavior/Discharge Planning: ?Goal: Compliance with prescribed medication regimen will improve ?Outcome: Progressing ?  ?Problem: Medication: ?Goal: Risk for medication side effects will decrease ?Outcome: Progressing ?  ?Problem: Clinical Measurements: ?Goal: Complications related to the disease process, condition or treatment will be avoided or minimized ?Outcome: Progressing ?  ?Problem: Clinical Measurements: ?Goal: Diagnostic test results will improve ?Outcome: Progressing ?  ?Problem: Safety: ?Goal: Verbalization of understanding the information provided will improve ?Outcome: Progressing ?  ?Problem: Self-Concept: ?Goal: Level of anxiety will decrease ?Outcome: Progressing ?  ?Problem: Self-Concept: ?Goal: Ability to verbalize feelings about condition will improve ?Outcome: Progressing ?  ?Problem: Education: ?Goal: Knowledge of General Education information will improve ?Description: Including pain rating scale, medication(s)/side effects and non-pharmacologic comfort measures ?Outcome: Completed/Met ?  ?Problem: Health Behavior/Discharge Planning: ?Goal: Ability to manage health-related needs will improve ?Outcome: Progressing ?  ?Problem: Clinical Measurements: ?Goal: Ability to maintain clinical measurements within normal limits will improve ?Outcome: Progressing ?  ?Problem: Clinical Measurements: ?Goal: Will remain free from infection ?Outcome: Progressing ?  ?Problem: Clinical Measurements: ?Goal: Diagnostic test results will improve ?Outcome: Completed/Met ?  ?

## 2021-12-09 DIAGNOSIS — I10 Essential (primary) hypertension: Secondary | ICD-10-CM | POA: Diagnosis not present

## 2021-12-09 DIAGNOSIS — I5022 Chronic systolic (congestive) heart failure: Secondary | ICD-10-CM | POA: Diagnosis not present

## 2021-12-09 DIAGNOSIS — R569 Unspecified convulsions: Secondary | ICD-10-CM | POA: Diagnosis not present

## 2021-12-09 LAB — BASIC METABOLIC PANEL
Anion gap: 7 (ref 5–15)
BUN: 7 mg/dL — ABNORMAL LOW (ref 8–23)
CO2: 27 mmol/L (ref 22–32)
Calcium: 8.7 mg/dL — ABNORMAL LOW (ref 8.9–10.3)
Chloride: 101 mmol/L (ref 98–111)
Creatinine, Ser: 0.59 mg/dL — ABNORMAL LOW (ref 0.61–1.24)
GFR, Estimated: >60 mL/min (ref >60)
Glucose, Bld: 77 mg/dL (ref 70–99)
Potassium: 3.8 mmol/L (ref 3.5–5.1)
Sodium: 135 mmol/L (ref 135–145)

## 2021-12-09 LAB — CBC
HCT: 32.6 % — ABNORMAL LOW (ref 39.0–52.0)
Hemoglobin: 11.4 g/dL — ABNORMAL LOW (ref 13.0–17.0)
MCH: 31.1 pg (ref 26.0–34.0)
MCHC: 35 g/dL (ref 30.0–36.0)
MCV: 89.1 fL (ref 80.0–100.0)
Platelets: 199 10*3/uL (ref 150–400)
RBC: 3.66 MIL/uL — ABNORMAL LOW (ref 4.22–5.81)
RDW: 12.6 % (ref 11.5–15.5)
WBC: 10.2 10*3/uL (ref 4.0–10.5)
nRBC: 0 % (ref 0.0–0.2)

## 2021-12-09 MED ORDER — DIVALPROEX SODIUM ER 500 MG PO TB24
500.0000 mg | ORAL_TABLET | Freq: Every day | ORAL | Status: DC
  Administered 2021-12-10 – 2021-12-11 (×2): 500 mg via ORAL
  Filled 2021-12-09 (×2): qty 1

## 2021-12-09 MED ORDER — DIVALPROEX SODIUM ER 500 MG PO TB24
750.0000 mg | ORAL_TABLET | Freq: Every day | ORAL | Status: DC
  Administered 2021-12-09 – 2021-12-10 (×2): 750 mg via ORAL
  Filled 2021-12-09 (×2): qty 1

## 2021-12-09 MED ORDER — LEVETIRACETAM 500 MG PO TABS
1500.0000 mg | ORAL_TABLET | Freq: Two times a day (BID) | ORAL | Status: DC
  Administered 2021-12-09 – 2021-12-11 (×5): 1500 mg via ORAL
  Filled 2021-12-09 (×5): qty 3

## 2021-12-09 NOTE — Evaluation (Signed)
Physical Therapy Evaluation ?Patient Details ?Name: Corey Meyers ?MRN: [id number] ?DOB: January 06, 1951 ?Today's Date: 12/09/2021 ? ?History of Present Illness ? 71 y.o. male with medical history significant of seizures d/o , h/o cva and htn coming to  [place] with seizures. Pt witnessed having seizures and given versed 2 mg via ems. Pt is noted to have h/o seizures due to his stroke. 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  ?Clinical Impression ? Pt with substantial expressive aphasia, per discussion with wife it appears better now than on arrival, apparently he had similar presentation after prior seizure ~7 months ago.  He was able to do prolonged bout of ambulation, negotiate up/down steps, use bathroom, etc all w/o LOBs with good confidence but did show poor general/safety awareness.  Spoke with wife about increasing how much time she is with him (she acts as caregiver in home 1.5 miles away) when he first returns home.  As he is not currently at his baseline recommending HHPT  for home assessment and facilitating safe transition home, though if impulsivity/aphasia improves (as he is in regular phone communication with wife) improves he may not need PT follow up.    ?   ? ?Recommendations for follow up therapy are one component of a multi-disciplinary discharge planning process, led by the attending physician.  Recommendations may be updated based on patient status, additional functional criteria and insurance authorization. ? ?Follow Up Recommendations Home health PT (per progress, may not need PT) ? ?  ?Assistance Recommended at Discharge None  ?Patient can return home with the following ? Direct supervision/assist for medications management;Assist for transportation ? ?  ?Equipment Recommendations None recommended by PT  ?Recommendations for Other Services ?    ?  ?Functional Status  Assessment Patient has had a recent decline in their functional status and demonstrates the ability to make significant improvements in function in a reasonable and predictable amount of time.  ? ?  ?Precautions / Restrictions Precautions ?Precautions: Fall (seizure) ?Restrictions ?Weight Bearing Restrictions: No  ? ?  ? ?Mobility ? Bed Mobility ?Overal bed mobility: Independent ?  ?  ?  ?  ?  ?  ?General bed mobility comments: Pt initially uninterested in getting up but did need to use the bathroom and therefore did get up and continued to work with PT ?  ? ?Transfers ?Overall transfer level: Modified independent ?Equipment used: None ?  ?  ?  ?  ?  ?  ?  ?General transfer comment: Pt was able to rise to standing on numerous occasions w/o assist or hesitation ?  ? ?Ambulation/Gait ?Ambulation/Gait assistance: Modified independent (Device/Increase time) ?Gait Distance (Feet): 400 Feet ?Assistive device: None ?  ?  ?  ?  ?General Gait Details: Pt was able to confidently and safety do multiple loops around the nurses' station.  He was able to maintain community appropriate speed and had no LOBs.  Vitals remained stable and appropriate t/o the effort. ? ?Stairs ?Stairs: Yes ?Stairs assistance: Modified independent (Device/Increase time) ?Stair Management: One rail Right, Alternating pattern ?Number of Stairs: 6 ?General stair comments: Pt was able to negotiate up/down steps with ease and only light UE/rail use. ? ?Wheelchair Mobility ?  ? ?Modified Rankin (Stroke Patients Only) ?  ? ?  ? ?Balance Overall balance assessment: Modified Independent, No apparent balance deficits (not formally assessed) ?  ?  ?  ?  ?  ?  ?  ?  ?  ?  ?  ?  ?  ?  ?  ?  ?  ?  ?   ? ? ? ?  Pertinent Vitals/Pain Pain Assessment ?Pain Assessment: No/denies pain  ? ? ?Home Living Family/patient expects to be discharged to:: Private residence ?Living Arrangements: Alone (wife lives 1.5 miles away (caring for mother), checks in daily) ?Available Help  at Discharge: Family;Available PRN/intermittently (wife gets/manages his meds (reports car as unreliable), states they are on the phone daily and that she could increase in-person supervision for a few day on d/c if needed) ?Type of Home: House ?Home Access: Stairs to enter ?Entrance Stairs-Rails: None ?Entrance Stairs-Number of Steps: 2 ?  ?  ?Home Equipment: None ?   ?  ?Prior Function Prior Level of Function : Independent/Modified Independent ?  ?  ?  ?  ?  ?  ?Mobility Comments: Pt had just started driving after 6 months cessation post prior seizure ?ADLs Comments: wife helps with meds and errands, but he is independent in the home ?  ? ? ?Hand Dominance  ?   ? ?  ?Extremity/Trunk Assessment  ? Upper Extremity Assessment ?Upper Extremity Assessment: Overall WFL for tasks assessed ?  ? ?Lower Extremity Assessment ?Lower Extremity Assessment: Overall WFL for tasks assessed ?  ? ?   ?Communication  ? Communication: Expressive difficulties (per wife: initial inability to speak and then expressive issues is similar to his acute response after prior seizures)  ?Cognition Arousal/Alertness: Awake/alert ?Behavior During Therapy: Impulsive, Restless ?Overall Cognitive Status: Impaired/Different from baseline ?  ?  ?  ?  ?  ?  ?  ?  ?  ?  ?  ?  ?  ?  ?  ?  ?General Comments: Pt appears to internally know what he wants to say but very much struggled to verbalize ?  ?  ? ?  ?General Comments General comments (skin integrity, edema, etc.): Pt with experessive issues, not overly eager to participate but ultimately showed functional mobiilty ? ?  ?Exercises    ? ?Assessment/Plan  ?  ?PT Assessment Patient needs continued PT services  ?PT Problem List Decreased safety awareness;Decreased balance ? ?   ?  ?PT Treatment Interventions Gait training;Stair training;Functional mobility training;Therapeutic activities;Therapeutic exercise;Balance training;Cognitive remediation   ? ?PT Goals (Current goals can be found in the Care Plan  section)  ?Acute Rehab PT Goals ?Patient Stated Goal: none stated ?PT Goal Formulation: With patient ?Time For Goal Achievement: 12/23/21 ?Potential to Achieve Goals: Good ? ?  ?Frequency Min 2X/week ?  ? ? ?Co-evaluation   ?  ?  ?  ?  ? ? ?  ?AM-PAC PT "6 Clicks" Mobility  ?Outcome Measure Help needed turning from your back to your side while in a flat bed without using bedrails?: None ?Help needed moving from lying on your back to sitting on the side of a flat bed without using bedrails?: None ?Help needed moving to and from a bed to a chair (including a wheelchair)?: None ?Help needed standing up from a chair using your arms (e.g., wheelchair or bedside chair)?: None ?Help needed to walk in hospital room?: None ?Help needed climbing 3-5 steps with a railing? : None ?6 Click Score: 24 ? ?  ?End of Session Equipment Utilized During Treatment: Gait belt ?Activity Tolerance: Patient tolerated treatment well ?Patient left: with bed alarm set;with call bell/phone within reach;with family/visitor present ?Nurse Communication: Mobility status ?PT Visit Diagnosis: Muscle weakness (generalized) (M62.81);Difficulty in walking, not elsewhere classified (R26.2) ?  ? ?Time: 9381-8299 ?PT Time Calculation (min) (ACUTE ONLY): 27 min ? ? ?Charges:   PT Evaluation ?$PT Eval Low Complexity:  1 Low ?PT Treatments ?$Gait Training: 8-22 mins ?  ?   ? ? ?Malachi ProGalen R Mileah Hemmer, DPT ?12/09/2021, 1:11 PM ? ?

## 2021-12-09 NOTE — Progress Notes (Signed)
?PROGRESS NOTE ? ? ? ?Corey Meyers  WUJ:[id number] DOB: Mar 26, 1951 DOA: 12/07/2021 ?PCP: Lauro RegulusAnderson, Marshall W, MD  ? ?Assessment & Plan: ?  ?Principal Problem: ?  Seizures (HCC) ?Active Problems: ?  Hyponatremia ?  Anemia ?  Essential hypertension ?  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  Chronic systolic CHF (congestive heart failure) (HCC) ? ?Seizures: continue on keppra, depakote as per neuro. Ativan prn. Difficulty w/ talking post seizures in the past as per pt's wife. EEG shows evidence of epileptogenity and cortical dysfunction arising form the left fronto-temporaral region likely due to previous CVA. Still having difficulty talking today  ? ?Hyponatremia: resolved  ? ?HTN: continue on coreg ?  ?Hx of embolic CVA: involving left middle cerebral arter. LV thrombus s/p revascularization. MRI brain shows no acute intracranial abnormality. Continue on coreg, xarelto, statin.  ? ?Chronic systolic CHF: continue on statin, coreg  ? ?Leukocytosis: resolved  ? ?DVT prophylaxis: xarelto  ?Code Status: full  ?Family Communication: called pt's wife, no answer so I left a voicemail  ?Disposition Plan: likely d/c home w/ home health  ? ?Level of care: Progressive ? ?Status is: Inpatient ?Remains inpatient appropriate because:  ? ? ? ?Consultants:  ?Neuro ? ?Procedures: ? ?Antimicrobials: ? ? ?Subjective: ?Pt is still unable to answer questions appropriately today  ? ?Objective: ?Vitals:  ? 12/08/21 1631 12/08/21 1938 12/08/21 2318 12/09/21 0435  ?BP: (!) 98/57 100/63 (!) 97/55 117/71  ?Pulse: 64 72 61 64  ?Resp:  14 19 20   ?Temp: 97.6 ?F (36.4 ?C) 98.6 ?F (37 ?C) 98.6 ?F (37 ?C) 98.2 ?F (36.8 ?C)  ?TempSrc: Oral Oral    ?SpO2: 99% 92% 95% 99%  ?Weight:      ?Height:      ? ? ?Intake/Output Summary (Last 24 hours) at 12/09/2021 0755 ?Last data filed at 12/09/2021 0433 ?Gross per 24 hour  ?Intake 1180.92 ml  ?Output 300 ml  ?Net 880.92 ml  ? ?Filed Weights  ? 12/07/21  1335  ?Weight: 90 kg  ? ? ?Examination: ? ?General exam: appears calm & comfortable   ?Respiratory system: clear breath sounds b/l  ?Cardiovascular system: S1/S2+. No rubs or clicks ?Gastrointestinal system: Abd is soft, NT, ND & hypoactive bowel sounds. ?Central nervous system: alert and awake. Moves all extremities  ?Psychiatry: Judgement and insight appears not at baseline. Flat mood and affect ? ? ? ?Data Reviewed: I have personally reviewed following labs and imaging studies ? ?CBC: ?Recent Labs  ?Lab 12/07/21 ?1337 12/08/21 ?0221 12/09/21 ?[id number]  ?WBC 10.4 17.0* 10.2  ?NEUTROABS 7.8*  --   --   ?HGB 12.7* 12.4* 11.4*  ?HCT 37.0* 34.5* 32.6*  ?MCV 89.4 85.8 89.1  ?PLT 246 210 199  ? ?Basic Metabolic Panel: ?Recent Labs  ?Lab 12/07/21 ?1337 12/08/21 ?0221 12/08/21 ?1538 12/09/21 ?[id number]  ?NA 129* 128* 133* 135  ?K 3.7 3.8  --  3.8  ?CL 90* 93*  --  101  ?CO2 22 27  --  27  ?GLUCOSE 99 103*  --  77  ?BUN 13 9  --  7*  ?CREATININE 0.86 0.51*  --  0.59*  ?CALCIUM 8.7* 8.5*  --  8.7*  ? ?GFR: ?Estimated Creatinine Clearance: 98.4 mL/min (A) (by C-G formula based on SCr of 0.59 mg/dL (L)). ?Liver Function Tests: ?Recent Labs  ?Lab 12/07/21 ?1337 12/08/21 ?0221  ?AST 45* 28  ?ALT 21 19  ?ALKPHOS 55 47  ?BILITOT 0.6 0.9  ?  PROT 7.9 7.1  ?ALBUMIN 3.6 3.3*  ? ?No results for input(s): LIPASE, AMYLASE in the last 168 hours. ?Recent Labs  ?Lab 12/08/21 ?1253  ?AMMONIA 18  ? ?Coagulation Profile: ?No results for input(s): INR, PROTIME in the last 168 hours. ?Cardiac Enzymes: ?Recent Labs  ?Lab 12/07/21 ?2339  ?CKTOTAL 202  ? ?BNP (last 3 results) ?No results for input(s): PROBNP in the last 8760 hours. ?HbA1C: ?No results for input(s): HGBA1C in the last 72 hours. ?CBG: ?Recent Labs  ?Lab 12/07/21 ?2119  ?GLUCAP 101*  ? ?Lipid Profile: ?No results for input(s): CHOL, HDL, LDLCALC, TRIG, CHOLHDL, LDLDIRECT in the last 72 hours. ?Thyroid Function Tests: ?Recent Labs  ?  12/07/21 ?2145  ?TSH 1.921  ?FREET4 1.08  ? ?Anemia  Panel: ?No results for input(s): VITAMINB12, FOLATE, FERRITIN, TIBC, IRON, RETICCTPCT in the last 72 hours. ?Sepsis Labs: ?Recent Labs  ?Lab 12/07/21 ?2339 12/08/21 ?0221  ?LATICACIDVEN 0.9 0.8  ? ? ?Recent Results (from the past 240 hour(s))  ?Resp Panel by RT-PCR (Flu A&B, Covid) Nasopharyngeal Swab     Status: None  ? Collection Time: 12/07/21  9:07 PM  ? Specimen: Nasopharyngeal Swab; Nasopharyngeal(NP) swabs in vial transport medium  ?Result Value Ref Range Status  ? SARS Coronavirus 2 by RT PCR NEGATIVE NEGATIVE Final  ?  Comment: (NOTE) ?SARS-CoV-2 target nucleic acids are NOT DETECTED. ? ?The SARS-CoV-2 RNA is generally detectable in upper respiratory ?specimens during the acute phase of infection. The lowest ?concentration of SARS-CoV-2 viral copies this assay can detect is ?138 copies/mL. A negative result does not preclude SARS-Cov-2 ?infection and should not be used as the sole basis for treatment or ?other patient management decisions. A negative result may occur with  ?improper specimen collection/handling, submission of specimen other ?than nasopharyngeal swab, presence of viral mutation(s) within the ?areas targeted by this assay, and inadequate number of viral ?copies(<138 copies/mL). A negative result must be combined with ?clinical observations, patient history, and epidemiological ?information. The expected result is Negative. ? ?Fact Sheet for Patients:  ?BloggerCourse.com ? ?Fact Sheet for Healthcare Providers:  ?SeriousBroker.it ? ?This test is no t yet approved or cleared by the [place] FDA and  ?has been authorized for detection and/or diagnosis of SARS-CoV-2 by ?FDA under an Emergency Use Authorization (EUA). This EUA will remain  ?in effect (meaning this test can be used) for the duration of the ?COVID-19 declaration under Section 564(b)(1) of the Act, 21 ?U.S.C.section 360bbb-3(b)(1), unless the authorization is terminated  ?or  revoked sooner.  ? ? ?  ? Influenza A by PCR NEGATIVE NEGATIVE Final  ? Influenza B by PCR NEGATIVE NEGATIVE Final  ?  Comment: (NOTE) ?The Xpert Xpress SARS-CoV-2/FLU/RSV plus assay is intended as an aid ?in the diagnosis of influenza from Nasopharyngeal swab specimens and ?should not be used as a sole basis for treatment. Nasal washings and ?aspirates are unacceptable for Xpert Xpress SARS-CoV-2/FLU/RSV ?testing. ? ?Fact Sheet for Patients: ?BloggerCourse.com ? ?Fact Sheet for Healthcare Providers: ?SeriousBroker.it ? ?This test is not yet approved or cleared by the [place] FDA and ?has been authorized for detection and/or diagnosis of SARS-CoV-2 by ?FDA under an Emergency Use Authorization (EUA). This EUA will remain ?in effect (meaning this test can be used) for the duration of the ?COVID-19 declaration under Section 564(b)(1) of the Act, 21 U.S.C. ?section 360bbb-3(b)(1), unless the authorization is terminated or ?revoked. ? ?Performed at [facility], [address] [place], ?[place] [address] ?  ?  ? ? ? ? ? ?  Radiology Studies: ?CT Head Wo Contrast ? ?Result Date: 12/07/2021 ?CLINICAL DATA:  Mental status change, unknown cause EXAM: CT HEAD WITHOUT CONTRAST TECHNIQUE: Contiguous axial images were obtained from the base of the skull through the vertex without intravenous contrast. RADIATION DOSE REDUCTION: This exam was performed according to the departmental dose-optimization program which includes automated exposure control, adjustment of the mA and/or kV according to patient size and/or use of iterative reconstruction technique. COMPARISON:  MRI 04/22/2021.  CT head 04/21/2021. FINDINGS: Brain: No evidence of acute infarction, hemorrhage, hydrocephalus, extra-axial collection or mass lesion/mass effect. Remote left posterior MCA territory infarct with similar encephalomalacia. Vascular: No hyperdense vessel identified. Calcific intracranial  atherosclerosis. Skull: No acute fracture. Sinuses/Orbits: Moderate mucosal thickening of scattered ethmoid air cells. Milder mucosal thickening of the inferior maxillary sinuses. No acute orbital abnormality. Other: N

## 2021-12-09 NOTE — Evaluation (Addendum)
Occupational Therapy Evaluation ?Patient Details ?Name: Corey Meyers ?MRN: [id number] ?DOB: 07/30/1951 ?Today's Date: 12/09/2021 ? ? ?History of Present Illness Pt. is a 71 y.o. male with medical history significant of seizures d/o , h/o cva and htn coming to  [place] with seizures. Pt witnessed having seizures and given versed 2 mg via ems. Pt is noted to have h/o seizures due to his stroke. 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  ? ?Clinical Impression ?  ?Prior functional history was obtained from the chart, as pt. Is  unable to provide prior functional history information at this time. Pt. has expressive aphasia, and perseverated on saying "70%" during the initial assessment. Pt. resides at home alone. Pt.'s wife resides approximately 1.5 miles while caring for her mother, and checked in daily. Pt. was independent with daily ADLs in the home. Pt.'s wife assists with medication management, and home management tasks.  Pt. had just resumed driving after 6 month cessation following a previous seizure.  Pt. Is independent with bed mobility, modified independent sit to stand,  Supervision functional mobility with IV, and MinA LE dressing presenting with difficulty manipulating his sock at while seated at the EOB. Impulsivity was noted. Pt. Could benefit from OT services for ADL training, A/E training, and pt. Education about home safety awareness, and judgement. Pt. Would benefit form HHOT for a home safety assessment.  ?   ? ?Recommendations for follow up therapy are one component of a multi-disciplinary discharge planning process, led by the attending physician.  Recommendations may be updated based on patient status, additional functional criteria and insurance authorization.  ? ?Follow Up Recommendations ? Home health OT, 24 hour supervison ?  ?Assistance Recommended at Discharge     ?Patient can return home with the following A little help with bathing/dressing/bathroom;Assist for transportation;Assistance with cooking/housework;Direct supervision/assist for medications management ? ?  ?Functional Status Assessment ? Patient has had a recent decline in their functional status and demonstrates the ability to make significant improvements in function in a reasonable and predictable amount of time.  ?Equipment Recommendations ?    ?  ?Recommendations for Other Services   ? ? ?  ?Precautions / Restrictions Precautions ?Precautions: Fall ?Restrictions ?Weight Bearing Restrictions: No  ? ?  ? ?Mobility Bed Mobility ?Overal bed mobility: Independent ?  ?  ?  ?  ?  ?  ?  ?  ? ?Transfers ?Overall transfer level: Modified independent ?Equipment used: None ?Transfers: Sit to/from Stand ?  ?  ?  ?  ?  ?  ?  ?  ? ?  ?Balance Overall balance assessment: Modified Independent, No apparent balance deficits (not formally assessed) ?  ?  ?  ?  ?  ?  ?  ?  ?  ?  ?  ?  ?  ?  ?  ?  ?  ?  ?   ? ?ADL either performed or assessed with clinical judgement  ? ?ADL Overall ADL's : Needs assistance/impaired ?  ?  ?Grooming: Supervision/safety ?  ?  ?  ?  ?  ?Upper Body Dressing : Set up ?  ?Lower Body Dressing: Minimal assistance ?  ?  ?  ?  ?  ?  ?  ?Functional mobility during ADLs: Supervision/safety ?   ? ? ? ?Vision   ?Additional Comments: Continue to assess in functional context  ?   ?  Perception   ?  ?Praxis   ?  ? ?Pertinent Vitals/Pain Pain Assessment ?Pain Assessment: No/denies pain  ? ? ? ?Hand Dominance Right ?  ?Extremity/Trunk Assessment Upper Extremity Assessment ?Upper Extremity Assessment: Overall WFL for tasks assessed;RUE deficits/detail ?RUE Coordination: decreased fine motor ?  ?  ?  ?  ?  ?Communication Communication ?Communication: Expressive difficulties ?  ?Cognition Arousal/Alertness: Awake/alert ?Behavior During Therapy: Impulsive, Restless ?Overall Cognitive Status: Impaired/Different from  baseline ?  ?  ?  ?  ?  ?  ?  ?  ?  ?  ?  ?  ?  ?  ?  ?  ?  ?  ?  ?General Comments    ? ?  ?Exercises   ?  ?Shoulder Instructions    ? ? ?Home Living   ?Living Arrangements: Alone (Wife resides 1.5 miles away while caring for her mother,a nd checks in on the pt. daily.) ?Available Help at Discharge: Family;Available PRN/intermittently ?Type of Home: House ?  ?  ?  ?  ?  ?  ?  ?  ?  ?  ?  ?Home Equipment: None ?  ?  ?  ? ?  ?Prior Functioning/Environment Prior Level of Function : Independent/Modified Independent ?  ?  ?  ?  ?  ?  ?Mobility Comments: Pt had just started driving after 6 months cessation post prior seizure ?ADLs Comments: wife helps with meds and errands, but he is independent in the home ?  ? ?  ?  ?OT Problem List: Decreased safety awareness ?  ?   ?OT Treatment/Interventions: Self-care/ADL training;DME and/or AE instruction;Patient/family education  ?  ?OT Goals(Current goals can be found in the care plan section) Acute Rehab OT Goals ?OT Goal Formulation: Patient unable to participate in goal setting ?Time For Goal Achievement: 12/09/21 ?Potential to Achieve Goals: Good  ?OT Frequency: Min 2X/week ?  ? ?Co-evaluation   ?  ?  ?  ?  ? ?  ?AM-PAC OT "6 Clicks" Daily Activity     ?Outcome Measure Help from another person eating meals?: None ?Help from another person taking care of personal grooming?: A Little ?Help from another person toileting, which includes using toliet, bedpan, or urinal?: A Little ?Help from another person bathing (including washing, rinsing, drying)?: A Little ?Help from another person to put on and taking off regular upper body clothing?: None ?Help from another person to put on and taking off regular lower body clothing?: A Little ?6 Click Score: 20 ?  ?End of Session Equipment Utilized During Treatment: Gait belt ? ?Activity Tolerance: Patient tolerated treatment well ?Patient left: in bed;with call bell/phone within reach;with bed alarm set (Bedrail cushion pads  reapplied) ? ?OT Visit Diagnosis: Muscle weakness (generalized) (M62.81)  ?              ?Time: 4174-0814 ?OT Time Calculation (min): 20 min ?Charges:  OT General Charges ?$OT Visit: 1 Visit ?OT Evaluation ?$OT Eval Moderate Complexity: 1 Mod ? ?Olegario Messier, MS, OTR/L  ? ?Olegario Messier ?12/09/2021, 5:05 PM ?

## 2021-12-09 NOTE — Progress Notes (Signed)
Neurology Progress Note ? ? ?S:// ?Seen and examined.  More awake today. ?Still remains aphasic. ? ? ?O:// ?Current vital signs: ?BP 113/70 (BP Location: Left Arm)   Pulse 74   Temp 98.4 ?F (36.9 ?C)   Resp 19   Ht '5\' 10"'  (1.778 m)   Wt 90 kg   SpO2 98%   BMI 28.47 kg/m?  ?Vital signs in last 24 hours: ?Temp:  [97.6 ?F (36.4 ?C)-98.9 ?F (37.2 ?C)] 98.4 ?F (36.9 ?C) (03/23 3818) ?Pulse Rate:  [61-74] 74 (03/23 0837) ?Resp:  [14-20] 19 (03/23 0837) ?BP: (97-117)/(55-71) 113/70 (03/23 2993) ?SpO2:  [92 %-99 %] 98 % (03/23 0837) ?General: Awake alert in bed, just had his breakfast ?HEENT: Normocephalic atraumatic ?CVs: Regular rhythm ?Respiratory: Breathing well saturating normally on room air ?Abdomen nondistended nontender ?Neurological exam ?He is awake alert oriented to self-was able to tell me his name Corey Meyers. ?Keeps perseverating on his name. ?Unable to name objects ?Unable to follow simple commands verbally but is able to mimic some-example was able to raise his arms when I raise my arms up and asked him to follow. ?Unable to repeat ?Cranial nerves: 2-12 grossly intact ?Motor examination with antigravity strength without drift in all 4 extremities ?Sensation intact to light touch ?Coordination difficult to assess given ability to follow commands ? ? ?Medications ? ?Current Facility-Administered Medications:  ?  0.9 %  sodium chloride infusion, 75 mL/hr, Intravenous, Continuous, Wyvonnia Dusky, MD, Last Rate: 75 mL/hr at 12/09/21 0433, 75 mL/hr at 12/09/21 0433 ?  acetaminophen (TYLENOL) tablet 650 mg, 650 mg, Oral, Q4H PRN **OR** acetaminophen (TYLENOL) suppository 650 mg, 650 mg, Rectal, Q4H PRN, Para Skeans, MD ?  atorvastatin (LIPITOR) tablet 80 mg, 80 mg, Oral, Daily, Florina Ou V, MD ?  carvedilol (COREG) tablet 3.125 mg, 3.125 mg, Oral, BID WC, Patel, Ekta V, MD ?  chlorhexidine gluconate (MEDLINE KIT) (PERIDEX) 0.12 % solution 15 mL, 15 mL, Mouth Rinse, BID, Florina Ou V, MD, 15 mL at  12/08/21 2033 ?  docusate sodium (COLACE) capsule 100 mg, 100 mg, Oral, BID, Para Skeans, MD, 100 mg at 12/08/21 2257 ?  levETIRAcetam (KEPPRA) IVPB 1500 mg/ 100 mL premix, 1,500 mg, Intravenous, Q12H, Amie Portland, MD, Last Rate: 400 mL/hr at 12/09/21 0002, 1,500 mg at 12/09/21 0002 ?  LORazepam (ATIVAN) injection 1 mg, 1 mg, Intravenous, Q4H PRN, Wyvonnia Dusky, MD ?  LORazepam (ATIVAN) injection 4 mg, 4 mg, Intravenous, Q5 Min x 2 PRN, Para Skeans, MD ?  MEDLINE mouth rinse, 15 mL, Mouth Rinse, 10 times per day, Para Skeans, MD, 15 mL at 12/09/21 0636 ?  ondansetron (ZOFRAN) tablet 4 mg, 4 mg, Oral, Q6H PRN **OR** ondansetron (ZOFRAN) injection 4 mg, 4 mg, Intravenous, Q6H PRN, Para Skeans, MD, 4 mg at 12/07/21 2144 ?  polyethylene glycol (MIRALAX / GLYCOLAX) packet 17 g, 17 g, Oral, Daily PRN, Para Skeans, MD ?  rivaroxaban (XARELTO) tablet 20 mg, 20 mg, Oral, Daily, Para Skeans, MD ?  valproate (DEPACON) 500 mg in dextrose 5 % 50 mL IVPB, 500 mg, Intravenous, q AM, Last Rate: 55 mL/hr at 12/09/21 0635, 500 mg at 12/09/21 0635 **AND** valproate (DEPACON) 750 mg in dextrose 5 % 50 mL IVPB, 750 mg, Intravenous, QHS, Amie Portland, MD, Last Rate: 57.5 mL/hr at 12/08/21 2132, 750 mg at 12/08/21 2132 ?Labs ?CBC ?   ?Component Value Date/Time  ? WBC 10.2 12/09/2021 0548  ? RBC 3.66 (L)  12/09/2021 0548  ? HGB 11.4 (L) 12/09/2021 0548  ? HGB 13.1 02/12/2014 0210  ? HCT 32.6 (L) 12/09/2021 0548  ? HCT 38.7 (L) 02/12/2014 0210  ? PLT 199 12/09/2021 0548  ? PLT 291 02/12/2014 0210  ? MCV 89.1 12/09/2021 0548  ? MCV 89 02/12/2014 0210  ? MCH 31.1 12/09/2021 0548  ? MCHC 35.0 12/09/2021 0548  ? RDW 12.6 12/09/2021 0548  ? RDW 14.4 02/12/2014 0210  ? LYMPHSABS 1.5 12/07/2021 1337  ? LYMPHSABS 1.9 02/12/2014 0210  ? MONOABS 0.9 12/07/2021 1337  ? MONOABS 1.7 (H) 02/12/2014 0210  ? EOSABS 0.1 12/07/2021 1337  ? EOSABS 0.0 02/12/2014 0210  ? BASOSABS 0.0 12/07/2021 1337  ? BASOSABS 0.1 02/12/2014 0210   ? ? ?CMP  ?   ?Component Value Date/Time  ? NA 135 12/09/2021 0548  ? NA 135 (L) 02/12/2014 0210  ? K 3.8 12/09/2021 0548  ? K 3.5 02/12/2014 0210  ? CL 101 12/09/2021 0548  ? CL 102 02/12/2014 0210  ? CO2 27 12/09/2021 0548  ? CO2 26 02/12/2014 0210  ? GLUCOSE 77 12/09/2021 0548  ? GLUCOSE 128 (H) 02/12/2014 0210  ? BUN 7 (L) 12/09/2021 0548  ? BUN 11 02/12/2014 0210  ? CREATININE 0.59 (L) 12/09/2021 0548  ? CREATININE 0.73 02/12/2014 0210  ? CALCIUM 8.7 (L) 12/09/2021 0548  ? CALCIUM 8.4 (L) 02/12/2014 0210  ? PROT 7.1 12/08/2021 0221  ? PROT 6.4 02/12/2014 0210  ? ALBUMIN 3.3 (L) 12/08/2021 0221  ? ALBUMIN 3.0 (L) 02/12/2014 0210  ? AST 28 12/08/2021 0221  ? AST 336 (H) 02/12/2014 0210  ? ALT 19 12/08/2021 0221  ? ALT 57 02/12/2014 0210  ? ALKPHOS 47 12/08/2021 0221  ? ALKPHOS 65 02/12/2014 0210  ? BILITOT 0.9 12/08/2021 0221  ? BILITOT 0.5 02/12/2014 0210  ? GFRNONAA >60 12/09/2021 0548  ? GFRNONAA >60 02/12/2014 0210  ? GFRAA >60 05/20/2020 0527  ? GFRAA >60 02/12/2014 0210  ?Ammonia 18 ?Valproate level 64 ? ?Imaging ?I have reviewed images in epic and the results pertinent to this consultation are: ?No new imaging-MRI brain with no acute stroke.  Chronic left MCA stroke. ? ? ?EEG was completed yesterday that showed left-sided spikes-in the left frontotemporal region.  There is also continuous slowing in the left hemisphere with maximal left frontotemporal region. ?The study showed evidence of epileptogenicity and cortical dysfunction arising from the left frontotemporal region likely due to underlying stroke.  No seizures were seen. ? ?Assessment: 71 year old man with past history of left MCA stroke, ensuing post stroke epilepsy coming in with breakthrough seizures. ?Spoke with wife yesterday who indicated that his postictal states after he has had breakthrough seizures is very long and could be as long as 7 to 10 days which happened last time he was admitted. ?His medications were verified from his  outpatient visit a few months ago at [facility] clinic neurology.  They had reduced some of his medications because of increasing daytime somnolence. ?I have increased the Keppra back to 1500 twice daily and Keppra at 500 every morning and 750 nightly-level was therapeutic at 64 ?He has in the past been transferred over for continuous EEG at [facility] but the wife is reluctant for transfer as she has transportation issues getting to that facility. ?His exam is somewhat improving although slowly. ? ?I I recommend that we follow him another day clinically, repeat an EEG tomorrow and then decide if he needs transfer and discussed that  with the wife.  If she chooses not to pursue the transfer, that would be her prerogative. ? ?Impression: ?Breakthrough seizures, with known history of seizures ?Post-stroke epilepsy ?History of left MCA stroke ? ?Recommendations: ?-Continue Keppra 1500 twice daily ?-Continue Depakote 500 every morning and 750 nightly ?-Maintain seizure precautions ?-Continue frequent neurochecks ?-Routine EEG tomorrow ?-If exam or EEG show worsening, may need transfer to a tertiary center for continuous EEG for further adjustment of antiepileptics. ?-Stat IV Ativan 1 mg as needed for breakthrough seizure more than 5 minutes. ? ?Discussed with Dr. Jimmye Norman on the floors. ? ?-- ?Amie Portland, MD ?Neurologist ?[facility] ?Pager: [phone] ?

## 2021-12-10 DIAGNOSIS — I10 Essential (primary) hypertension: Secondary | ICD-10-CM | POA: Diagnosis not present

## 2021-12-10 DIAGNOSIS — I63412 Cerebral infarction due to embolism of left middle cerebral artery: Secondary | ICD-10-CM

## 2021-12-10 DIAGNOSIS — R569 Unspecified convulsions: Secondary | ICD-10-CM | POA: Diagnosis not present

## 2021-12-10 LAB — BASIC METABOLIC PANEL
Anion gap: 8 (ref 5–15)
BUN: <5 mg/dL — ABNORMAL LOW (ref 8–23)
CO2: 27 mmol/L (ref 22–32)
Calcium: 8.5 mg/dL — ABNORMAL LOW (ref 8.9–10.3)
Chloride: 101 mmol/L (ref 98–111)
Creatinine, Ser: 0.47 mg/dL — ABNORMAL LOW (ref 0.61–1.24)
GFR, Estimated: >60 mL/min (ref >60)
Glucose, Bld: 82 mg/dL (ref 70–99)
Potassium: 3.7 mmol/L (ref 3.5–5.1)
Sodium: 136 mmol/L (ref 135–145)

## 2021-12-10 LAB — CBC
HCT: 32.8 % — ABNORMAL LOW (ref 39.0–52.0)
Hemoglobin: 11.4 g/dL — ABNORMAL LOW (ref 13.0–17.0)
MCH: 30.6 pg (ref 26.0–34.0)
MCHC: 34.8 g/dL (ref 30.0–36.0)
MCV: 88.2 fL (ref 80.0–100.0)
Platelets: 219 10*3/uL (ref 150–400)
RBC: 3.72 MIL/uL — ABNORMAL LOW (ref 4.22–5.81)
RDW: 12.8 % (ref 11.5–15.5)
WBC: 7.1 10*3/uL (ref 4.0–10.5)
nRBC: 0 % (ref 0.0–0.2)

## 2021-12-10 MED ORDER — POLYETHYLENE GLYCOL 3350 17 G PO PACK
17.0000 g | PACK | Freq: Every day | ORAL | Status: DC
  Administered 2021-12-11: 17 g via ORAL
  Filled 2021-12-10: qty 1

## 2021-12-10 MED ORDER — SODIUM CHLORIDE 0.9 % IV BOLUS
500.0000 mL | Freq: Once | INTRAVENOUS | Status: AC
  Administered 2021-12-10: 500 mL via INTRAVENOUS

## 2021-12-10 MED ORDER — BISACODYL 5 MG PO TBEC
10.0000 mg | DELAYED_RELEASE_TABLET | Freq: Every day | ORAL | Status: DC
Start: 2021-12-10 — End: 2021-12-11
  Administered 2021-12-10 – 2021-12-11 (×2): 10 mg via ORAL
  Filled 2021-12-10 (×2): qty 2

## 2021-12-10 NOTE — Progress Notes (Signed)
Occupational Therapy Treatment ?Patient Details ?Name: Corey Meyers ?MRN: [id number] ?DOB: 27-Apr-1951 ?Today's Date: 12/10/2021 ? ? ?History of present illness Pt. is a 71 y.o. male with medical history significant of seizures d/o , h/o cva and htn coming to  [place] with seizures. Pt witnessed having seizures and given versed 2 mg via ems. Pt is noted to have h/o seizures due to his stroke. 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 ?  ?OT comments ? Corey Meyers performed well this afternoon, requiring no assistance for bed mobility, transfers, grooming, self-feeding, and dressing. He displayed good balance in sitting and standing w/o use of DME and no LOB. Pt is able to engage in appropriate conversation today, with little expressive aphasia noted. Pt may benefit from [facility] to conduct a home safety assessment with suggestions for any needed modifications,  but Corey Meyers does not require any additional hospital-based OT at present. Will sign off now.  ? ?Recommendations for follow up therapy are one component of a multi-disciplinary discharge planning process, led by the attending physician.  Recommendations may be updated based on patient status, additional functional criteria and insurance authorization. ?   ?Follow Up Recommendations ? Home health OT  ?  ?Assistance Recommended at Discharge    ?Patient can return home with the following ? A little help with bathing/dressing/bathroom;Assist for transportation;Assistance with cooking/housework;Direct supervision/assist for medications management ?  ?Equipment Recommendations ? None recommended by OT  ?  ?Recommendations for Other Services   ? ?  ?Precautions / Restrictions Precautions ?Precautions: Fall ?Precaution Comments: seizures ?Restrictions ?Weight Bearing Restrictions: No  ? ? ?  ? ?Mobility Bed Mobility ?Overal bed mobility: Independent ?   ?  ?  ?  ?  ?  ?  ?  ? ?Transfers ?Overall transfer level: Modified independent ?Equipment used: None ?Transfers: Sit to/from Stand ?  ?  ?  ?  ?  ?  ?  ?  ?  ?Balance Overall balance assessment: Modified Independent, No apparent balance deficits (not formally assessed) ?  ?  ?  ?  ?  ?  ?  ?  ?  ?  ?  ?  ?  ?  ?  ?  ?  ?  ?   ? ?ADL either performed or assessed with clinical judgement  ? ?ADL   ?Eating/Feeding: Modified independent ?  ?Grooming: Modified independent ?  ?  ?  ?  ?  ?  ?  ?Lower Body Dressing: Supervision/safety ?Lower Body Dressing Details (indicate cue type and reason): SUPV for doffing/donning socks while seated ?  ?  ?  ?  ?  ?  ?Functional mobility during ADLs: Supervision/safety ?  ?  ? ?Extremity/Trunk Assessment Upper Extremity Assessment ?Upper Extremity Assessment: Overall WFL for tasks assessed ?  ?Lower Extremity Assessment ?Lower Extremity Assessment: Overall WFL for tasks assessed ?  ?Cervical / Trunk Assessment ?Cervical / Trunk Assessment: Normal ?  ? ?Vision Patient Visual Report: No change from baseline ?  ?  ?Perception   ?  ?Praxis   ?  ? ?Cognition Arousal/Alertness: Awake/alert ?Behavior During Therapy: [facility] for tasks assessed/performed ?Overall Cognitive Status: Within Functional Limits for tasks assessed ?  ?  ?  ?  ?  ?  ?  ?  ?  ?  ?  ?  ?  ?  ?  ?  ?General Comments: Much improved  verbal abilities today ?  ?  ?   ?Exercises Other Exercises ?Other Exercises: UE therex sitting EOB ? ?  ?Shoulder Instructions   ? ? ?  ?General Comments    ? ? ?Pertinent Vitals/ Pain       Pain Assessment ?Pain Assessment: No/denies pain ? ?Home Living   ?  ?  ?  ?  ?  ?  ?  ?  ?  ?  ?  ?  ?  ?  ?  ?  ?  ?  ? ?  ?Prior Functioning/Environment    ?  ?  ?  ?   ? ?Frequency ? Other (comment) (No additional acute OT necessary, pt at baseline)  ? ? ? ? ?  ?Progress Toward Goals ? ?OT Goals(current goals can now be found in the care plan section) ? Progress towards OT goals: Progressing toward  goals ? ?Acute Rehab OT Goals ?OT Goal Formulation: With patient ?Time For Goal Achievement: 12/09/21 ?Potential to Achieve Goals: Good  ?Plan Frequency needs to be updated;Discharge plan remains appropriate   ? ?Co-evaluation ? ? ?   ?  ?  ?  ?  ? ?  ?AM-PAC OT "6 Clicks" Daily Activity     ?Outcome Measure ? ? Help from another person eating meals?: None ?Help from another person taking care of personal grooming?: None ?Help from another person toileting, which includes using toliet, bedpan, or urinal?: None ?Help from another person bathing (including washing, rinsing, drying)?: A Little ?Help from another person to put on and taking off regular upper body clothing?: None ?Help from another person to put on and taking off regular lower body clothing?: A Little ?6 Click Score: 22 ? ?  ?End of Session   ? ?OT Visit Diagnosis: Muscle weakness (generalized) (M62.81) ?  ?Activity Tolerance Patient tolerated treatment well ?  ?Patient Left in bed;with call bell/phone within reach;with bed alarm set;with nursing/sitter in room ?  ?Nurse Communication   ?  ? ?   ? ?Time: 1253-1301 ?OT Time Calculation (min): 8 min ? ?Charges: OT General Charges ?$OT Visit: 1 Visit ?OT Treatments ?$Self Care/Home Management : 8-22 mins ? ?Latina Craver, PhD, MS, OTR/L ?12/10/21, 1:08 PM ? ?

## 2021-12-10 NOTE — Care Management Important Message (Signed)
Important Message ? ?Patient Details  ?Name: Corey Meyers ?MRN: [id number] ?Date of Birth: 23-Jan-1951 ? ? ?Medicare Important Message Given:  Yes ? ? ? ? ?Corey Meyers ?12/10/2021, 2:17 PM ?

## 2021-12-10 NOTE — TOC CM/SW Note (Signed)
Left VM for wife requesting return call to discuss DC planning. ? Alfonso Ramus, LCSW ?[phone] ? ?

## 2021-12-10 NOTE — Progress Notes (Signed)
Neurology Progress Note ? ? ?S:// ?Seen and examined ?Speech much improved ? ? ?O:// ?Current vital signs: ?BP 116/77 (BP Location: Left Arm)   Pulse 61   Temp 98.4 ?F (36.9 ?C)   Resp 16   Ht _0  (1.778 m)   Wt 90 kg   SpO2 99%   BMI 28.47 kg/m?  ?Vital signs in last 24 hours: ?Temp:  [98.1 ?F (36.7 ?C)-98.6 ?F (37 ?C)] 98.4 ?F (36.9 ?C) (03/24 0809) ?Pulse Rate:  [59-66] 61 (03/24 0809) ?Resp:  [15-20] 16 (03/24 0809) ?BP: (106-124)/(57-77) 116/77 (03/24 0809) ?SpO2:  [95 %-99 %] 99 % (03/24 0809) ?General: Awake alert in no distress ?HEENT: Normocephalic atraumatic ?CVs: Regular rhythm ?Respiratory: Breathing well saturating normally room air ?Abdomen nondistended nontender ?Neurological exam ?Awake alert oriented to the fact that he is [facility] the [facility] hospital.  Could not tell me the month. ?Was able to name simple objects. ?Was able to follow simple commands ?Although the above to language parameters were not too abnormal, he was unable to repeat simple sentences for me today. ?Overall the speech is much better than what it was yesterday. ?No obvious dysarthria ?Cranial nerves: Pupils equal round react light, extract movements intact, visual field full, face appears grossly symmetric, facial sensation intact, tongue and palate midline. ?Motor examination with antigravity in all fours ?Sensation intact to light touch ?Coordination exam with no dysmetria ? ? ?Medications ? ?Current Facility-Administered Medications:  ?  0.9 %  sodium chloride infusion, 75 mL/hr, Intravenous, Continuous, Wyvonnia Dusky, MD, Last Rate: 75 mL/hr at 12/09/21 1932, 75 mL/hr at 12/09/21 1932 ?  acetaminophen (TYLENOL) tablet 650 mg, 650 mg, Oral, Q4H PRN **OR** acetaminophen (TYLENOL) suppository 650 mg, 650 mg, Rectal, Q4H PRN, Para Skeans, MD ?  atorvastatin (LIPITOR) tablet 80 mg, 80 mg, Oral, Daily, Florina Ou V, MD, 80 mg at 12/10/21 0946 ?  carvedilol (COREG) tablet 3.125 mg, 3.125 mg, Oral, BID WC, Florina Ou  V, MD, 3.125 mg at 12/10/21 0815 ?  chlorhexidine gluconate (MEDLINE KIT) (PERIDEX) 0.12 % solution 15 mL, 15 mL, Mouth Rinse, BID, Para Skeans, MD, 15 mL at 12/10/21 0818 ?  divalproex (DEPAKOTE ER) 24 hr tablet 500 mg, 500 mg, Oral, Daily, 500 mg at 12/10/21 0814 **AND** divalproex (DEPAKOTE ER) 24 hr tablet 750 mg, 750 mg, Oral, QHS, Amie Portland, MD, 750 mg at 12/09/21 2124 ?  docusate sodium (COLACE) capsule 100 mg, 100 mg, Oral, BID, Para Skeans, MD, 100 mg at 12/10/21 0946 ?  levETIRAcetam (KEPPRA) tablet 1,500 mg, 1,500 mg, Oral, BID, Amie Portland, MD, 1,500 mg at 12/10/21 0946 ?  LORazepam (ATIVAN) injection 1 mg, 1 mg, Intravenous, Q4H PRN, Wyvonnia Dusky, MD ?  LORazepam (ATIVAN) injection 4 mg, 4 mg, Intravenous, Q5 Min x 2 PRN, Para Skeans, MD ?  ondansetron (ZOFRAN) tablet 4 mg, 4 mg, Oral, Q6H PRN **OR** ondansetron (ZOFRAN) injection 4 mg, 4 mg, Intravenous, Q6H PRN, Para Skeans, MD, 4 mg at 12/07/21 2144 ?  polyethylene glycol (MIRALAX / GLYCOLAX) packet 17 g, 17 g, Oral, Daily PRN, Para Skeans, MD, 17 g at 12/09/21 1658 ?  rivaroxaban (XARELTO) tablet 20 mg, 20 mg, Oral, Daily, Para Skeans, MD, 20 mg at 12/10/21 0946 ?Labs ?CBC ?   ?Component Value Date/Time  ? WBC 7.1 12/10/2021 0524  ? RBC 3.72 (L) 12/10/2021 0524  ? HGB 11.4 (L) 12/10/2021 0524  ? HGB 13.1 02/12/2014 0210  ? HCT 32.8 (  L) 12/10/2021 0524  ? HCT 38.7 (L) 02/12/2014 0210  ? PLT 219 12/10/2021 0524  ? PLT 291 02/12/2014 0210  ? MCV 88.2 12/10/2021 0524  ? MCV 89 02/12/2014 0210  ? MCH 30.6 12/10/2021 0524  ? MCHC 34.8 12/10/2021 0524  ? RDW 12.8 12/10/2021 0524  ? RDW 14.4 02/12/2014 0210  ? LYMPHSABS 1.5 12/07/2021 1337  ? LYMPHSABS 1.9 02/12/2014 0210  ? MONOABS 0.9 12/07/2021 1337  ? MONOABS 1.7 (H) 02/12/2014 0210  ? EOSABS 0.1 12/07/2021 1337  ? EOSABS 0.0 02/12/2014 0210  ? BASOSABS 0.0 12/07/2021 1337  ? BASOSABS 0.1 02/12/2014 0210  ? ? ?CMP  ?   ?Component Value Date/Time  ? NA 136 12/10/2021 0524  ? NA  135 (L) 02/12/2014 0210  ? K 3.7 12/10/2021 0524  ? K 3.5 02/12/2014 0210  ? CL 101 12/10/2021 0524  ? CL 102 02/12/2014 0210  ? CO2 27 12/10/2021 0524  ? CO2 26 02/12/2014 0210  ? GLUCOSE 82 12/10/2021 0524  ? GLUCOSE 128 (H) 02/12/2014 0210  ? BUN <5 (L) 12/10/2021 0524  ? BUN 11 02/12/2014 0210  ? CREATININE 0.47 (L) 12/10/2021 0524  ? CREATININE 0.73 02/12/2014 0210  ? CALCIUM 8.5 (L) 12/10/2021 0524  ? CALCIUM 8.4 (L) 02/12/2014 0210  ? PROT 7.1 12/08/2021 0221  ? PROT 6.4 02/12/2014 0210  ? ALBUMIN 3.3 (L) 12/08/2021 0221  ? ALBUMIN 3.0 (L) 02/12/2014 0210  ? AST 28 12/08/2021 0221  ? AST 336 (H) 02/12/2014 0210  ? ALT 19 12/08/2021 0221  ? ALT 57 02/12/2014 0210  ? ALKPHOS 47 12/08/2021 0221  ? ALKPHOS 65 02/12/2014 0210  ? BILITOT 0.9 12/08/2021 0221  ? BILITOT 0.5 02/12/2014 0210  ? GFRNONAA >60 12/10/2021 0524  ? GFRNONAA >60 02/12/2014 0210  ? GFRAA >60 05/20/2020 0527  ? GFRAA >60 02/12/2014 0210  ?Ammonia 18 ?Valproate level 64 ? ?Imaging ?I have reviewed images in epic and the results pertinent to this consultation are: ?No new imaging-MRI brain with no acute stroke.  Chronic left MCA stroke. ? ? ?EEG was completed yesterday that showed left-sided spikes-in the left frontotemporal region.  There is also continuous slowing in the left hemisphere with maximal left frontotemporal region. ?The study showed evidence of epileptogenicity and cortical dysfunction arising from the left frontotemporal region likely due to underlying stroke.  No seizures were seen. ? ?Assessment: 71 year old man with past history of left MCA stroke, ensuing post stroke epilepsy coming in with breakthrough seizures. ?Was extremely aphasic and the speech is not improving to the point where he has good naming, somewhat recent comprehension but still having a difficult time with repetition.  This is much improved than what he had, and with very was globally aphasic. ?In my conversation with the wife she has indicated that he is at  prolonged postictal states every time he had seizures and this is what it seems like this time as well. ? ?He has in the past been transferred over for continuous EEG at [facility] but the wife is reluctant for transfer as she has transportation issues getting to that facility. His exam is gradually improving and I do not have a suspicion of nonconvulsive status epilepticus hence I would not pursue transfer. ? ?Impression: ?Breakthrough seizures, with known history of seizures ?Post-stroke epilepsy ?History of left MCA stroke ? ?Recommendations: ?-Continue Keppra 1500 twice daily ?-Continue Depakote 500 every morning and 750 nightly ?-Maintain seizure precautions ?-Continue frequent neurochecks ?-Do not see a  need for repeat EEG ? ?-At this time, I would not be aversive to watching him for another day to see if he has more improvement in his speech so that he can be safely discharged home.  But if the wife/family and the patient are okay being discharged and have support at home, I think that might not be unreasonable as well. ? ?Discussed with Dr. Jimmye Norman on the floors. ? ?-- ?Amie Portland, MD ?Neurologist ?[facility] ?Pager: [phone] ?

## 2021-12-10 NOTE — Progress Notes (Signed)
PT Cancellation Note ? ?Patient Details ?Name: Corey Meyers ?MRN: [id number] ?DOB: Mar 03, 1951 ? ? ?Cancelled Treatment:    Reason Eval/Treat Not Completed: Pt declined to participate with PT services this date secondary to fatigue.  Will attempt to see pt at a future date/time as medically appropriate.  ? ? ?D. Elly Modena PT, DPT ?12/10/21, 3:11 PM ? ?

## 2021-12-10 NOTE — Progress Notes (Signed)
?PROGRESS NOTE ? ? ? ?Corey Meyers  NWG:[id number] DOB: 1951-02-19 DOA: 12/07/2021 ?PCP: Lauro Regulus, MD  ? ?Assessment & Plan: ?  ?Principal Problem: ?  Seizures (HCC) ?Active Problems: ?  Hyponatremia ?  Anemia ?  Essential hypertension ?  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  Chronic systolic CHF (congestive heart failure) (HCC) ? ?Seizures: continue on keppra, depakote as per neuro. Ativan prn. Difficulty talking but improved slightly from day prior but not a baseline. EEG shows evidence of epileptogenity & cortical dysfunction arising form the left fronto-temporal region likely due to previous CVA  ? ?Hyponatremia: resolved  ? ?HTN: continue on coreg  ?  ?Hx of embolic CVA: involving left middle cerebral arter. LV thrombus s/p revascularization. MRI brain shows no acute intracranial abnormality. Continue on coreg, xarelto, statin  ? ?Chronic systolic CHF: continue on coreg, statin ? ?Leukocytosis: resolved ? ?DVT prophylaxis: xarelto  ?Code Status: full  ?Family Communication: discussed pt's care w/ pt's wife, [name], and answered her questions  ?Disposition Plan: likely d/c home w/ home health  ? ?Level of care: Progressive ? ?Status is: Inpatient ?Remains inpatient appropriate because: likely d/c home tomorrow if speech remains stable and/or improves ? ? ? ?Consultants:  ?Neuro ? ?Procedures: ? ?Antimicrobials: ? ? ?Subjective: ?Pt is able to answer some questions appropriately today  ? ?Objective: ?Vitals:  ? 12/09/21 1545 12/09/21 1935 12/10/21 0000 12/10/21 0331  ?BP: 108/67 (!) 107/57 106/69 124/76  ?Pulse: 66 63 63 (!) 59  ?Resp: 18 20 15 18   ?Temp: 98.6 ?F (37 ?C) 98.1 ?F (36.7 ?C) 98.2 ?F (36.8 ?C) 98.1 ?F (36.7 ?C)  ?TempSrc:    Oral  ?SpO2:  95% 97% 98%  ?Weight:      ?Height:      ? ? ?Intake/Output Summary (Last 24 hours) at 12/10/2021 0656 ?Last data filed at 12/10/2021 12/12/2021 ?Gross per 24 hour  ?Intake 3466.37 ml  ?Output 1625 ml   ?Net 1841.37 ml  ? ?Filed Weights  ? 12/07/21 1335  ?Weight: 90 kg  ? ? ?Examination: ? ?General exam: appears comfortable  ?Respiratory system: clear breath sounds b/l  ?Cardiovascular system: S1 & S2+. No rubs or gallops ?Gastrointestinal system: Abd is soft, NT, ND & hypoactive bowel sounds ?Central nervous system: Alert & awake. Moves all extremities  ?Psychiatry: Judgement and insight appears not at baseline. Flat mood and affect ? ? ? ?Data Reviewed: I have personally reviewed following labs and imaging studies ? ?CBC: ?Recent Labs  ?Lab 12/07/21 ?1337 12/08/21 ?0221 12/09/21 ?12/11/21 12/10/21 ?12/12/21  ?WBC 10.4 17.0* 10.2 7.1  ?NEUTROABS 7.8*  --   --   --   ?HGB 12.7* 12.4* 11.4* 11.4*  ?HCT 37.0* 34.5* 32.6* 32.8*  ?MCV 89.4 85.8 89.1 88.2  ?PLT 246 210 199 219  ? ?Basic Metabolic Panel: ?Recent Labs  ?Lab 12/07/21 ?1337 12/08/21 ?0221 12/08/21 ?1538 12/09/21 ?12/11/21  ?NA 129* 128* 133* 135  ?K 3.7 3.8  --  3.8  ?CL 90* 93*  --  101  ?CO2 22 27  --  27  ?GLUCOSE 99 103*  --  77  ?BUN 13 9  --  7*  ?CREATININE 0.86 0.51*  --  0.59*  ?CALCIUM 8.7* 8.5*  --  8.7*  ? ?GFR: ?Estimated Creatinine Clearance: 98.4 mL/min (A) (by C-G formula based on SCr of 0.59 mg/dL (L)). ?Liver Function Tests: ?Recent Labs  ?Lab 12/07/21 ?1337 12/08/21 ?0221  ?AST 45*  28  ?ALT 21 19  ?ALKPHOS 55 47  ?BILITOT 0.6 0.9  ?PROT 7.9 7.1  ?ALBUMIN 3.6 3.3*  ? ?No results for input(s): LIPASE, AMYLASE in the last 168 hours. ?Recent Labs  ?Lab 12/08/21 ?1253  ?AMMONIA 18  ? ?Coagulation Profile: ?No results for input(s): INR, PROTIME in the last 168 hours. ?Cardiac Enzymes: ?Recent Labs  ?Lab 12/07/21 ?2339  ?CKTOTAL 202  ? ?BNP (last 3 results) ?No results for input(s): PROBNP in the last 8760 hours. ?HbA1C: ?No results for input(s): HGBA1C in the last 72 hours. ?CBG: ?Recent Labs  ?Lab 12/07/21 ?2119  ?GLUCAP 101*  ? ?Lipid Profile: ?No results for input(s): CHOL, HDL, LDLCALC, TRIG, CHOLHDL, LDLDIRECT in the last 72 hours. ?Thyroid Function  Tests: ?Recent Labs  ?  12/07/21 ?2145  ?TSH 1.921  ?FREET4 1.08  ? ?Anemia Panel: ?No results for input(s): VITAMINB12, FOLATE, FERRITIN, TIBC, IRON, RETICCTPCT in the last 72 hours. ?Sepsis Labs: ?Recent Labs  ?Lab 12/07/21 ?2339 12/08/21 ?0221  ?LATICACIDVEN 0.9 0.8  ? ? ?Recent Results (from the past 240 hour(s))  ?Resp Panel by RT-PCR (Flu A&B, Covid) Nasopharyngeal Swab     Status: None  ? Collection Time: 12/07/21  9:07 PM  ? Specimen: Nasopharyngeal Swab; Nasopharyngeal(NP) swabs in vial transport medium  ?Result Value Ref Range Status  ? SARS Coronavirus 2 by RT PCR NEGATIVE NEGATIVE Final  ?  Comment: (NOTE) ?SARS-CoV-2 target nucleic acids are NOT DETECTED. ? ?The SARS-CoV-2 RNA is generally detectable in upper respiratory ?specimens during the acute phase of infection. The lowest ?concentration of SARS-CoV-2 viral copies this assay can detect is ?138 copies/mL. A negative result does not preclude SARS-Cov-2 ?infection and should not be used as the sole basis for treatment or ?other patient management decisions. A negative result may occur with  ?improper specimen collection/handling, submission of specimen other ?than nasopharyngeal swab, presence of viral mutation(s) within the ?areas targeted by this assay, and inadequate number of viral ?copies(<138 copies/mL). A negative result must be combined with ?clinical observations, patient history, and epidemiological ?information. The expected result is Negative. ? ?Fact Sheet for Patients:  ?BloggerCourse.comhttps://www.fda.gov/media/152166/download ? ?Fact Sheet for Healthcare Providers:  ?SeriousBroker.ithttps://www.fda.gov/media/152162/download ? ?This test is no t yet approved or cleared by the [place] FDA and  ?has been authorized for detection and/or diagnosis of SARS-CoV-2 by ?FDA under an Emergency Use Authorization (EUA). This EUA will remain  ?in effect (meaning this test can be used) for the duration of the ?COVID-19 declaration under Section 564(b)(1) of the Act,  21 ?U.S.C.section 360bbb-3(b)(1), unless the authorization is terminated  ?or revoked sooner.  ? ? ?  ? Influenza A by PCR NEGATIVE NEGATIVE Final  ? Influenza B by PCR NEGATIVE NEGATIVE Final  ?  Comment: (NOTE) ?The Xpert Xpress SARS-CoV-2/FLU/RSV plus assay is intended as an aid ?in the diagnosis of influenza from Nasopharyngeal swab specimens and ?should not be used as a sole basis for treatment. Nasal washings and ?aspirates are unacceptable for Xpert Xpress SARS-CoV-2/FLU/RSV ?testing. ? ?Fact Sheet for Patients: ?BloggerCourse.comhttps://www.fda.gov/media/152166/download ? ?Fact Sheet for Healthcare Providers: ?SeriousBroker.ithttps://www.fda.gov/media/152162/download ? ?This test is not yet approved or cleared by the [place] FDA and ?has been authorized for detection and/or diagnosis of SARS-CoV-2 by ?FDA under an Emergency Use Authorization (EUA). This EUA will remain ?in effect (meaning this test can be used) for the duration of the ?COVID-19 declaration under Section 564(b)(1) of the Act, 21 U.S.C. ?section 360bbb-3(b)(1), unless the authorization is terminated or ?revoked. ? ?Performed at [facility], [address] [place], ?[place] [address] ?  ?  ? ? ? ? ? ?Radiology Studies: ?EEG adult now ? ?Result Date: 12/08/2021 ?[name], MD     12/08/2021  5:29 PM Patient Name: Corey Meyers MRN: [id number] Epilepsy Attending: [name] Referring Physician/Provider: Gertha Calkin, MD Date: 12/08/2021 Duration: 25.59 mins Patient history: 71 year old with past history of left MCA stroke, ensuing post stroke epilepsy with breakthrough seizures. EEG to evaluate for seizure Level of alertness: Awake AEDs during EEG study: LEV, VPAA Technical aspects: This EEG study was done with scalp electrodes positioned according to the 10-20 International system of electrode placement. Electrical activity was acquired at a sampling rate of 500Hz  and reviewed with a high frequency filter of 70Hz  and a low frequency filter of 1Hz . EEG  data were recorded continuously and digitally stored. Description: The posterior dominant rhythm consists of 9 Hz activity of moderate voltage (25-35 uV) seen predominantly in posterior head regions, symmetric

## 2021-12-10 NOTE — TOC Initial Note (Signed)
Transition of Care (TOC) - Initial/Assessment Note  ? ? ?Patient Details  ?Name: Corey Meyers ?MRN: [id number] ?Date of Birth: Oct 05, 1950 ? ?Transition of Care (TOC) CM/SW Contact:    ?Domnic Vantol E Mariusz Jubb, LCSW ?Phone Number: ?12/10/2021, 11:56 AM ? ?Clinical Narrative:                Spoke to patient's wife Suanne Marker via phone regarding DC planning. ?Patient lives alone, however his wife lives 1.5 miles away and plans to provide additional supervision including overnight, at time of DC. ?PCP is Dr. Ouida Sills. Is closely followed by Neurology per Suanne Marker.  ?Pharmacy is [facility].  ?Patient went to SNF and HH in the past.  ?Explained [place] recs for PT, OT, and ST. Rhonda declined. ?Rhonda requested she will transport patient home at DC but it would need to be before dark as she does not drive in the dark. ?Wife had questions about medications/prescriptions, notified MD.   ? ? ?Expected Discharge Plan: Home/Self Care ?Barriers to Discharge: Barriers Resolved ? ? ?Patient Goals and CMS Choice ?Patient states their goals for this hospitalization and ongoing recovery are:: home ?CMS Medicare.gov Compare Post Acute Care list provided to:: Patient Represenative (must comment) ?Choice offered to / list presented to : Spouse ? ?Expected Discharge Plan and Services ?Expected Discharge Plan: Home/Self Care ?  ?  ?  ?Living arrangements for the past 2 months: [place] ?                ?  ?  ?  ?  ?  ?  ?  ?  ?  ?  ? ?Prior Living Arrangements/Services ?Living arrangements for the past 2 months: [place] ?Lives with:: Self ?Patient language and need for interpreter reviewed:: Yes ?Do you feel safe going back to the place where you live?: Yes      ?Need for Family Participation in Patient Care: Yes (Comment) ?Care giver support system in place?: Yes (comment) ?  ?Criminal Activity/Legal Involvement Pertinent to Current Situation/Hospitalization: No - Comment as needed ? ?Activities of Daily Living ?  ?ADL Screening  (condition at time of admission) ?Patient's cognitive ability adequate to safely complete daily activities?: No ?Is the patient deaf or have difficulty hearing?: No ?Does the patient have difficulty seeing, even when wearing glasses/contacts?: No ?Does the patient have difficulty concentrating, remembering, or making decisions?: Yes ?Patient able to express need for assistance with ADLs?: No ?Does the patient have difficulty dressing or bathing?: Yes ?Independently performs ADLs?: No ?Communication: Dependent ?Is this a change from baseline?: Change from baseline, expected to last <3 days ?Dressing (OT): Needs assistance ?Is this a change from baseline?: Change from baseline, expected to last <3days ?Grooming: Needs assistance ?Is this a change from baseline?: Change from baseline, expected to last <3 days ?Feeding: Needs assistance ?Is this a change from baseline?: Change from baseline, expected to last <3 days ?Bathing: Needs assistance ?Is this a change from baseline?: Change from baseline, expected to last <3 days ?Toileting: Needs assistance ?Is this a change from baseline?: Change from baseline, expected to last <3 days ?In/Out Bed: Needs assistance ?Is this a change from baseline?: Change from baseline, expected to last <3 days ?Walks in Home: Needs assistance ?Is this a change from baseline?: Change from baseline, expected to last <3 days ?Does the patient have difficulty walking or climbing stairs?: Yes ?Weakness of Legs: Both ?Weakness of Arms/Hands: Both ? ?Permission Sought/Granted ?  ?  ?   ?   ?   ?   ? ?  Emotional Assessment ?  ?  ?  ?  ?Alcohol / Substance Use: Not Applicable ?Psych Involvement: No (comment) ? ?Admission diagnosis:  Seizure ([place]) [R56.9] ?Seizures ([place]) [R56.9] ?Patient Active Problem List  ? Diagnosis Date Noted  ? Hyponatremia 12/07/2021  ? Anemia 12/07/2021  ? CHF exacerbation ([place]) 04/22/2021  ? UTI (urinary tract infection) 04/22/2021  ? Seizures ([place]) 04/22/2021  ? Acute CVA  (cerebrovascular accident) ([place]) 04/21/2021  ? 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 Essential hypertension 12/27/2018  ? Hyperlipidemia 12/27/2018  ? NSTEMI (non-ST elevated myocardial infarction) ([place]) 12/27/2018  ? Chronic systolic CHF (congestive heart failure) ([place]) 12/27/2018  ? 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  ? ?PCP:  Kirk Ruths, MD ?Pharmacy:   ?[place] ?[place] [address] ?Phone: [phone] Fax: [phone] ? ?[facility] ?[address] ?[place] [address] ?Phone: [phone] Fax: [phone] ? ? ? ? ?Social Determinants of Health (SDOH) Interventions ?  ? ?Readmission Risk Interventions ? ?  12/10/2021  ? 11:38 AM  ?Readmission Risk Prevention Plan  ?Transportation Screening Complete  ?PCP or Specialist Appt within 5-7 Days Complete  ?Home Care Screening Complete  ?Medication Review (RN CM) Complete  ? ? ? ?

## 2021-12-10 NOTE — Progress Notes (Signed)
Mobility Specialist - Progress Note ? ? 12/10/21 1500  ?Mobility  ?Activity Refused mobility  ? ? ? ?Pt refused session d/t fatigue. RN confirmed he recently refused PT. Will attempt at another date and time. ?Merrily Brittle ?Mobility Specialist ?12/10/21, 3:49 PM ? ? ? ?

## 2021-12-11 DIAGNOSIS — R338 Other retention of urine: Secondary | ICD-10-CM

## 2021-12-11 LAB — BASIC METABOLIC PANEL
Anion gap: 7 (ref 5–15)
BUN: 7 mg/dL — ABNORMAL LOW (ref 8–23)
CO2: 27 mmol/L (ref 22–32)
Calcium: 8.8 mg/dL — ABNORMAL LOW (ref 8.9–10.3)
Chloride: 101 mmol/L (ref 98–111)
Creatinine, Ser: 0.66 mg/dL (ref 0.61–1.24)
GFR, Estimated: >60 mL/min (ref >60)
Glucose, Bld: 98 mg/dL (ref 70–99)
Potassium: 4.1 mmol/L (ref 3.5–5.1)
Sodium: 135 mmol/L (ref 135–145)

## 2021-12-11 LAB — CBC
HCT: 35.6 % — ABNORMAL LOW (ref 39.0–52.0)
Hemoglobin: 12.2 g/dL — ABNORMAL LOW (ref 13.0–17.0)
MCH: 30.7 pg (ref 26.0–34.0)
MCHC: 34.3 g/dL (ref 30.0–36.0)
MCV: 89.4 fL (ref 80.0–100.0)
Platelets: 258 10*3/uL (ref 150–400)
RBC: 3.98 MIL/uL — ABNORMAL LOW (ref 4.22–5.81)
RDW: 12.8 % (ref 11.5–15.5)
WBC: 8.6 10*3/uL (ref 4.0–10.5)
nRBC: 0 % (ref 0.0–0.2)

## 2021-12-11 MED ORDER — TAMSULOSIN HCL 0.4 MG PO CAPS
0.4000 mg | ORAL_CAPSULE | Freq: Every day | ORAL | Status: DC
  Administered 2021-12-11: 0.4 mg via ORAL
  Filled 2021-12-11: qty 1

## 2021-12-11 MED ORDER — TAMSULOSIN HCL 0.4 MG PO CAPS: 0.4000 mg | ORAL_CAPSULE | Freq: Every day | ORAL | 0 refills | Status: AC

## 2021-12-11 MED ORDER — LEVETIRACETAM 500 MG PO TABS
1500.0000 mg | ORAL_TABLET | Freq: Two times a day (BID) | ORAL | 0 refills | Status: DC
Start: 2021-12-11 — End: 2023-07-05

## 2021-12-11 NOTE — Progress Notes (Signed)
Neurology Progress Note ? ? ?S:// ?Seen and examined ?Speech much improved from yesterday. ? ? ?O:// ?Current vital signs: ?BP 126/87 (BP Location: Right Arm)   Pulse 70   Temp 97.8 ?F (36.6 ?C)   Resp 19   Ht '5\' 10"'  (1.778 m)   Wt 90 kg   SpO2 100%   BMI 28.47 kg/m?  ?Vital signs in last 24 hours: ?Temp:  [97.5 ?F (36.4 ?C)-98.9 ?F (37.2 ?C)] 97.8 ?F (36.6 ?C) (03/25 0802) ?Pulse Rate:  [61-70] 70 (03/25 0802) ?Resp:  [14-19] 19 (03/25 0802) ?BP: (87-130)/(49-87) 126/87 (03/25 0802) ?SpO2:  [94 %-100 %] 100 % (03/25 0802) ?Weight:  [90 kg] 90 kg (03/24 2000) ?General: Awake alert in no distress ?HEENT: Normocephalic atraumatic ?CVs: Regular rhythm ?Respiratory: Breathing well saturating normally room air ?Abdomen nondistended nontender ?Neurological exam ?Awake alert oriented to the fact that he is at the regional hospital.  ?Was able to name simple objects. ?Was able to follow simple commands ?Different from yesterday, was able to repeat simple sentences with mild difficulty. ?Overall the speech is much better than what it was yesterday. ?No obvious dysarthria ?Cranial nerves: Pupils equal round react light, extract movements intact, visual field full, face appears grossly symmetric with may be a questionable subtle right lower facial droop which sometimes is positional as well, facial sensation intact, tongue and palate midline. ?Motor examination with antigravity in all fours ?Sensation intact to light touch ?Coordination exam with no dysmetria ? ? ?Medications ? ?Current Facility-Administered Medications:  ?  0.9 %  sodium chloride infusion, 75 mL/hr, Intravenous, Continuous, Wyvonnia Dusky, MD, Last Rate: 75 mL/hr at 12/09/21 1932, 75 mL/hr at 12/09/21 1932 ?  acetaminophen (TYLENOL) tablet 650 mg, 650 mg, Oral, Q4H PRN **OR** acetaminophen (TYLENOL) suppository 650 mg, 650 mg, Rectal, Q4H PRN, Para Skeans, MD ?  atorvastatin (LIPITOR) tablet 80 mg, 80 mg, Oral, Daily, Para Skeans, MD, 80 mg at  12/11/21 5035 ?  bisacodyl (DULCOLAX) EC tablet 10 mg, 10 mg, Oral, Daily, Wyvonnia Dusky, MD, 10 mg at 12/11/21 4656 ?  carvedilol (COREG) tablet 3.125 mg, 3.125 mg, Oral, BID WC, Para Skeans, MD, 3.125 mg at 12/11/21 8127 ?  chlorhexidine gluconate (MEDLINE KIT) (PERIDEX) 0.12 % solution 15 mL, 15 mL, Mouth Rinse, BID, Para Skeans, MD, 15 mL at 12/11/21 0926 ?  divalproex (DEPAKOTE ER) 24 hr tablet 500 mg, 500 mg, Oral, Daily, 500 mg at 12/11/21 0925 **AND** divalproex (DEPAKOTE ER) 24 hr tablet 750 mg, 750 mg, Oral, QHS, Amie Portland, MD, 750 mg at 12/10/21 2230 ?  docusate sodium (COLACE) capsule 100 mg, 100 mg, Oral, BID, Para Skeans, MD, 100 mg at 12/11/21 5170 ?  levETIRAcetam (KEPPRA) tablet 1,500 mg, 1,500 mg, Oral, BID, Amie Portland, MD, 1,500 mg at 12/11/21 0174 ?  LORazepam (ATIVAN) injection 1 mg, 1 mg, Intravenous, Q4H PRN, Wyvonnia Dusky, MD ?  LORazepam (ATIVAN) injection 4 mg, 4 mg, Intravenous, Q5 Min x 2 PRN, Para Skeans, MD ?  ondansetron (ZOFRAN) tablet 4 mg, 4 mg, Oral, Q6H PRN **OR** ondansetron (ZOFRAN) injection 4 mg, 4 mg, Intravenous, Q6H PRN, Para Skeans, MD, 4 mg at 12/07/21 2144 ?  polyethylene glycol (MIRALAX / GLYCOLAX) packet 17 g, 17 g, Oral, Daily, Wyvonnia Dusky, MD, 17 g at 12/11/21 9449 ?  rivaroxaban (XARELTO) tablet 20 mg, 20 mg, Oral, Daily, Para Skeans, MD, 20 mg at 12/11/21 6759 ?Labs ?CBC ?   ?Component  Value Date/Time  ? WBC 8.6 12/11/2021 0416  ? RBC 3.98 (L) 12/11/2021 0416  ? HGB 12.2 (L) 12/11/2021 0416  ? HGB 13.1 02/12/2014 0210  ? HCT 35.6 (L) 12/11/2021 0416  ? HCT 38.7 (L) 02/12/2014 0210  ? PLT 258 12/11/2021 0416  ? PLT 291 02/12/2014 0210  ? MCV 89.4 12/11/2021 0416  ? MCV 89 02/12/2014 0210  ? MCH 30.7 12/11/2021 0416  ? MCHC 34.3 12/11/2021 0416  ? RDW 12.8 12/11/2021 0416  ? RDW 14.4 02/12/2014 0210  ? LYMPHSABS 1.5 12/07/2021 1337  ? LYMPHSABS 1.9 02/12/2014 0210  ? MONOABS 0.9 12/07/2021 1337  ? MONOABS 1.7 (H) 02/12/2014 0210   ? EOSABS 0.1 12/07/2021 1337  ? EOSABS 0.0 02/12/2014 0210  ? BASOSABS 0.0 12/07/2021 1337  ? BASOSABS 0.1 02/12/2014 0210  ? ? ?CMP  ?   ?Component Value Date/Time  ? NA 135 12/11/2021 0416  ? NA 135 (L) 02/12/2014 0210  ? K 4.1 12/11/2021 0416  ? K 3.5 02/12/2014 0210  ? CL 101 12/11/2021 0416  ? CL 102 02/12/2014 0210  ? CO2 27 12/11/2021 0416  ? CO2 26 02/12/2014 0210  ? GLUCOSE 98 12/11/2021 0416  ? GLUCOSE 128 (H) 02/12/2014 0210  ? BUN 7 (L) 12/11/2021 0416  ? BUN 11 02/12/2014 0210  ? CREATININE 0.66 12/11/2021 0416  ? CREATININE 0.73 02/12/2014 0210  ? CALCIUM 8.8 (L) 12/11/2021 0416  ? CALCIUM 8.4 (L) 02/12/2014 0210  ? PROT 7.1 12/08/2021 0221  ? PROT 6.4 02/12/2014 0210  ? ALBUMIN 3.3 (L) 12/08/2021 0221  ? ALBUMIN 3.0 (L) 02/12/2014 0210  ? AST 28 12/08/2021 0221  ? AST 336 (H) 02/12/2014 0210  ? ALT 19 12/08/2021 0221  ? ALT 57 02/12/2014 0210  ? ALKPHOS 47 12/08/2021 0221  ? ALKPHOS 65 02/12/2014 0210  ? BILITOT 0.9 12/08/2021 0221  ? BILITOT 0.5 02/12/2014 0210  ? GFRNONAA >60 12/11/2021 0416  ? GFRNONAA >60 02/12/2014 0210  ? GFRAA >60 05/20/2020 0527  ? GFRAA >60 02/12/2014 0210  ?Ammonia 18 ?Valproate level 64 ? ?Imaging ?I have reviewed images in epic and the results pertinent to this consultation are: ?No new imaging-MRI brain with no acute stroke.  Chronic left MCA stroke. ? ? ?EEG was completed yesterday that showed left-sided spikes-in the left frontotemporal region.  There is also continuous slowing in the left hemisphere with maximal left frontotemporal region. ?The study showed evidence of epileptogenicity and cortical dysfunction arising from the left frontotemporal region likely due to underlying stroke.  No seizures were seen. ? ?Assessment: 71 year old man with past history of left MCA stroke, ensuing post stroke epilepsy coming in with breakthrough seizures. ?Was extremely aphasic and the speech is not improving to the point where he has good naming, somewhat recent comprehension but  still having a difficult time with repetition.  This is much improved than what he had, and with very was globally aphasic. In my conversation with the wife she has indicated that he is at prolonged postictal states every time he had seizures and this is what it seems like this time as well. ? ?He has in the past been transferred over for continuous EEG at [facility] but the wife is reluctant for transfer as she has transportation issues getting to that facility. His exam is gradually improving and I do not have a suspicion of nonconvulsive status epilepticus hence I would not pursue transfer. ? ?Today's exam is much improved with even repetition being  close to normal which was not the case over the past couple of days. ? ?Impression: ?Breakthrough seizures, with known history of seizures ?Post-stroke epilepsy ?History of left MCA stroke ? ?Recommendations: ?-Continue Keppra 1500 twice daily ?-Continue Depakote 500 every morning and 750 nightly ?-Maintain seizure precautions ?-Outpatient follow-up ? ?Discussed with Dr. Jimmye Norman on the floors. ? ?-- ?Amie Portland, MD ?Neurologist ?[facility] ?Pager: [phone] ? ?SEIZURE PRECAUTIONS ?Per [facility] statutes, patients with seizures are not allowed to drive until they have been seizure-free for six months.  ? ?Use caution when using heavy equipment or power tools. Avoid working on ladders or at heights. Take showers instead of baths. Ensure the water temperature is not too high on the home water heater. Do not go swimming alone. Do not lock yourself in a room alone (i.e. bathroom). When caring for infants or small children, sit down when holding, feeding, or changing them to minimize risk of injury to the child in the event you have a seizure. Maintain good sleep hygiene. Avoid alcohol.  ?  ?If patient has another seizure, call 911 and bring them back to the ED if: ?A.  The seizure lasts longer than 5 minutes.      ?B.  The patient  doesn't wake shortly after the seizure or has new problems such as difficulty seeing, speaking or moving following the seizure ?C.  The patient was injured during the seizure ?D.  The patient has a temperature

## 2021-12-11 NOTE — Progress Notes (Signed)
Patient being discharged home today. No changes in previous physical assessment. He is alert and oriented x 4 but forgetful at times. He complained of lower abdominal pain this am and stated that he had not been able to urinate. He stated that he has been having trouble for a couple weeks with only being able to dribble a little urine when he attempts to urinate. Bladder scan done that showed >1100 ml. In and Out cath done and emptied 1300 ml from bladder. He has also been started on flomax. ?

## 2021-12-11 NOTE — Discharge Summary (Addendum)
Physician Discharge Summary  ?Corey Meyers [id number] DOB: 02/28/1951 DOA: 12/07/2021 ? ?PCP: Kirk Ruths, MD ? ?Admit date: 12/07/2021 ?Discharge date: 12/11/2021 ? ?Admitted From: home  ?Disposition:  home  ? ?Recommendations for Outpatient Follow-up:  ?Follow up with PCP in 1-2 weeks ?F/u w/ neuro, Dr. Melrose Nakayama, in 1 week  ? ?Home Health: Pt's wife refused HH  ?Equipment/Devices: ? ?Discharge Condition: stable  ?CODE STATUS: full  ?Diet recommendation: Heart Healthy ? ?Brief/Interim Summary: ?HPI was taken from Dr. Florina Ou: ?Corey Meyers is a 71 y.o. male with medical history significant of seizures d/o , h/o cva and htn coming to  [place] with seizures. Pt witnessed having seizures and given versed 2 mg via ems. Pt is noted to have h/o seizures due to his stroke. 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 ?Pt on my exam is cooperative but nonverbal and not able to follow commands completely, "will open his mouth but not stick out his tongue"- when asked during exam.  ?In ed pt has elevated anion gap which is most likely from his seizures ? ? ?Hospital course as per Dr. Jimmye Norman 3/22-3/25/23: Pt was presented after a witnessed seizure. Pt was given versed by EMS. Pt was continued on home dose of keppra & depakote. Pt did have difficulty talking and comprehending after previous seizures and this time was no different. Pt's speech and comprehension improved daily but was not back to baseline at time of d/c. Of note, pt did have urinary retention while inpatient so pt was started on tamsulosin and had in/out cath x 1. For more information, please see previous progress/consult notes.  ? ?Discharge Diagnoses:  ?Principal Problem: ?  Seizures ([place]) ?Active Problems: ?  Hyponatremia ?  Anemia ?  Essential hypertension ?  Embolic stroke involving left middle cerebral artery (HCC) d/t LV  thrombus s/p revascularization ?  Chronic systolic CHF (congestive heart failure) ([place]) ? ?Seizures: continue on keppra, depakote as per neuro. Ativan prn. Difficulty talking but improved slightly from day prior but not a baseline. EEG shows evidence of epileptogenity & cortical dysfunction arising form the left fronto-temporal region likely due to previous CVA  ?  ?Hyponatremia: resolved  ?  ?HTN: continue on coreg  ?  ?Hx of embolic CVA: involving left middle cerebral arter. LV thrombus s/p revascularization. MRI brain shows no acute intracranial abnormality. Continue on coreg, xarelto, statin  ?  ?Chronic systolic CHF: continue on coreg, statin ?  ?Leukocytosis: resolved ? ?Urinary retention: likely secondary to BPH. Started on tamsulosin  ? ?Discharge Instructions ? ?Discharge Instructions   ? ? Diet - low sodium heart healthy   Complete by: As directed ?  ? Discharge instructions   Complete by: As directed ?  ? F/u w/ PCP in 1-2 weeks. F/u w/ neuro, Dr. Melrose Nakayama, in 1 week.  ? ?Per [facility] statutes, patients with seizures are not allowed to drive until they have been seizure-free for six months.  ?  ?Use caution when using heavy equipment or power tools. Avoid working on ladders or at heights. Take showers instead of baths. Ensure the water temperature is not too high on the home water heater. Do not go swimming alone. Do not lock yourself in a room alone (i.e. bathroom). When caring for infants or small children, sit down when holding, feeding, or changing them to minimize risk of injury to  the child in the event you have a seizure. Maintain good sleep hygiene. Avoid alcohol.  ?  ?If patient has another seizure, call 911 and bring them back to the ED if: ?A.  The seizure lasts longer than 5 minutes.      ?B.  The patient doesn't wake shortly after the seizure or has new problems such as difficulty seeing, speaking or moving following the seizure ?C.  The patient was injured during the seizure ?D.  The  patient has a temperature over 102 F (39C) ?E.  The patient vomited during the seizure and now is having trouble breathing  ? Increase activity slowly   Complete by: As directed ?  ? ?  ? ?Allergies as of 12/11/2021   ?No Known Allergies ?  ? ?  ?Medication List  ?  ? ?STOP taking these medications   ? ?lacosamide 200 MG Tabs tablet ?Commonly known as: VIMPAT ?  ? ?  ? ?TAKE these medications   ? ?aspirin-sod bicarb-citric acid 325 MG Tbef tablet ?Commonly known as: ALKA-SELTZER ?Take 325 mg by mouth every 6 (six) hours as needed (indigestion). ?  ?atorvastatin 80 MG tablet ?Commonly known as: LIPITOR ?Take 80 mg by mouth at bedtime. ?  ?carvedilol 3.125 MG tablet ?Commonly known as: COREG ?Take 3.125 mg by mouth 2 (two) times daily with a meal. ?  ?divalproex 250 MG 24 hr tablet ?Commonly known as: DEPAKOTE ER ?Take 500 mg by mouth daily. ?What changed: Another medication with the same name was removed. Continue taking this medication, and follow the directions you see here. ?  ?divalproex 250 MG 24 hr tablet ?Commonly known as: DEPAKOTE ER ?Take 750 mg by mouth at bedtime. ?What changed: Another medication with the same name was removed. Continue taking this medication, and follow the directions you see here. ?  ?DSS 100 MG Caps ?Take 200 mg by mouth 2 (two) times daily as needed. ?  ?levETIRAcetam 500 MG tablet ?Commonly known as: KEPPRA ?Take 3 tablets (1,500 mg total) by mouth 2 (two) times daily. ?What changed:  ?when to take this ?Another medication with the same name was removed. Continue taking this medication, and follow the directions you see here. ?  ?multivitamin with minerals Tabs tablet ?Take 1 tablet by mouth daily. ?  ?rivaroxaban 20 MG Tabs tablet ?Commonly known as: XARELTO ?Take 20 mg by mouth daily with breakfast. ?  ?tamsulosin 0.4 MG Caps capsule ?Commonly known as: FLOMAX ?Take 1 capsule (0.4 mg total) by mouth daily. ?  ? ?  ? ? Follow-up Information   ? ? Kirk Ruths, MD In 2 days.    ?Specialty: Internal Medicine ?Contact information: ?[place] [address] ?[phone] ? ? ?  ?  ? ? [facility].   ?Specialty: Emergency Medicine ?Why: If symptoms worsen ?Contact information: ?[id number] ?[place] ?[phone] ? ?  ?  ? ?  ?  ? ?  ? ?No Known Allergies ? ?Consultations: ?Neuro: Dr. Rory Percy  ? ? ?Procedures/Studies: ?CT Head Wo Contrast ? ?Result Date: 12/07/2021 ?CLINICAL DATA:  Mental status change, unknown cause EXAM: CT HEAD WITHOUT CONTRAST TECHNIQUE: Contiguous axial images were obtained from the base of the skull through the vertex without intravenous contrast. RADIATION DOSE REDUCTION: This exam was performed according to the departmental dose-optimization program which includes automated exposure control, adjustment of the mA and/or kV according to patient size  and/or use of iterative reconstruction technique. COMPARISON:  MRI 04/22/2021.  CT head 04/21/2021. FINDINGS: Brain: No evidence of acute infarction, hemorrhage, hydrocephalus, extra-axial collection or mass lesion/mass effect. Remote left posterior MCA territory infarct with similar encephalomalacia. Vascular: No hyperdense vessel identified. Calcific intracranial atherosclerosis. Skull: No acute fracture. Sinuses/Orbits: Moderate mucosal thickening of scattered ethmoid air cells. Milder mucosal thickening of the inferior maxillary sinuses. No acute orbital abnormality. Other: No mastoid effusions. IMPRESSION: 1. No evidence of acute intracranial abnormality. 2. Remote left posterior MCA territory infarct. 3. Paranasal sinus mucosal thickening. Electronically Signed   By: Margaretha Sheffield M.D.   On: 12/07/2021 14:10  ? ?MR BRAIN WO CONTRAST ? ?Result Date: 12/08/2021 ?CLINICAL DATA:  Encephalopathy EXAM: MRI HEAD WITHOUT CONTRAST TECHNIQUE: Multiplanar, multiecho pulse sequences of the brain and  surrounding structures were obtained without intravenous contrast. COMPARISON:  04/22/2021 FINDINGS: Brain: No acute infarct, mass effect or extra-axial collection. Mild generalized atrophy. Old pos

## 2022-12-16 ENCOUNTER — Encounter: Payer: Self-pay | Admitting: Gastroenterology

## 2022-12-21 ENCOUNTER — Encounter: Payer: Self-pay | Admitting: Gastroenterology

## 2022-12-22 ENCOUNTER — Ambulatory Visit
Admission: RE | Admit: 2022-12-22 | Discharge: 2022-12-22 | Disposition: A | Discharge status: Home / Self Care | Payer: Medicare HMO | Attending: Gastroenterology | Admitting: Gastroenterology

## 2022-12-22 ENCOUNTER — Encounter: Payer: Self-pay | Admitting: Gastroenterology

## 2022-12-22 ENCOUNTER — Ambulatory Visit: Payer: Medicare HMO | Admitting: Anesthesiology

## 2022-12-22 ENCOUNTER — Other Ambulatory Visit: Payer: Self-pay

## 2022-12-22 ENCOUNTER — Encounter
Admission: RE | Disposition: A | Discharge status: Home / Self Care | Payer: Self-pay | Source: Home / Self Care | Attending: Gastroenterology

## 2022-12-22 DIAGNOSIS — Z538 Procedure and treatment not carried out for other reasons: Secondary | ICD-10-CM | POA: Insufficient documentation

## 2022-12-22 DIAGNOSIS — Z8673 Personal history of transient ischemic attack (TIA), and cerebral infarction without residual deficits: Secondary | ICD-10-CM | POA: Insufficient documentation

## 2022-12-22 DIAGNOSIS — I252 Old myocardial infarction: Secondary | ICD-10-CM | POA: Insufficient documentation

## 2022-12-22 DIAGNOSIS — Z87891 Personal history of nicotine dependence: Secondary | ICD-10-CM | POA: Insufficient documentation

## 2022-12-22 DIAGNOSIS — R195 Other fecal abnormalities: Secondary | ICD-10-CM | POA: Insufficient documentation

## 2022-12-22 HISTORY — PX: ESOPHAGOGASTRODUODENOSCOPY: SHX5428

## 2022-12-22 HISTORY — DX: Hyperlipidemia, unspecified: E78.5

## 2022-12-22 HISTORY — DX: Anemia, unspecified: D64.9

## 2022-12-22 HISTORY — DX: Acute myocardial infarction, unspecified: I21.9

## 2022-12-22 HISTORY — PX: COLONOSCOPY: SHX5424

## 2022-12-22 HISTORY — DX: Heart failure, unspecified: I50.9

## 2022-12-22 HISTORY — DX: Atherosclerotic heart disease of native coronary artery without angina pectoris: I25.10

## 2022-12-22 HISTORY — DX: Hypo-osmolality and hyponatremia: E87.1

## 2022-12-22 HISTORY — DX: Unspecified convulsions: R56.9

## 2022-12-22 SURGERY — COLONOSCOPY
Anesthesia: General

## 2022-12-22 MED ORDER — SODIUM CHLORIDE 0.9 % IV SOLN: INTRAVENOUS | Status: DC

## 2022-12-22 NOTE — H&P (Signed)
Pre-Procedure H&P   Patient ID: Corey Meyers is a 72 y.o. male.  Gastroenterology Provider: [name], DO  Referring Provider: Tawni Pummel, PA PCP: Lauro Regulus, MD  Date: 12/23/2022  HPI Corey Meyers is a 72 y.o. male who presents today for Esophagogastroduodenoscopy and Colonoscopy for Positive Cologuard and iron deficiency anemia . He is accompanied by his wife at bedside who helps provide history. Initially scheduled for 12/22/22 and was not fully cleaned out so procedures postponed until today.  Patient with positive Cologuard in September 2023.  He was seen in the office in January 2023.  Does not know of any family history of colon cancer or colon polyps.  Patient notes constipation.  He denies any melena or hematochezia.  He has noted weight loss most recently weighing around 167 pounds.  In November 2022 he was reported 209 pounds and April 2023 189 pounds.  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  Last reported seizure March 2023, CVA 2020  Past Medical History:  Diagnosis Date   Anemia    CHF (congestive heart failure)    Coronary artery disease    HLD (hyperlipidemia)    Hyperlipidemia    Hypertension    Hyponatremia    Myocardial infarction    Seizures    last seizure March 2023   Stroke     Past Surgical History:  Procedure Laterality Date   COLONOSCOPY N/A 12/22/2022   Procedure: COLONOSCOPY;  Surgeon: [name], DO;  Location: [facility] ENDOSCOPY;  Service: Gastroenterology;  Laterality: N/A;   ESOPHAGOGASTRODUODENOSCOPY N/A 12/22/2022   Procedure: ESOPHAGOGASTRODUODENOSCOPY (EGD);  Surgeon: [name], DO;  Location: [facility] ENDOSCOPY;  Service: Gastroenterology;  Laterality: N/A;    IR CT HEAD LTD  12/21/2018   IR PERCUTANEOUS ART THROMBECTOMY/INFUSION INTRACRANIAL INC DIAG ANGIO  12/21/2018   LAMINECTOMY     RADIOLOGY WITH ANESTHESIA N/A 12/21/2018   Procedure: RADIOLOGY WITH ANESTHESIA;  Surgeon: Julieanne Cotton, MD;  Location: [facility] OR;  Service: Radiology;  Laterality: N/A;   TONSILLECTOMY      Family History No h/o GI disease or malignancy  Review of Systems  Constitutional:  Positive for unexpected weight change. Negative for activity change, appetite change, chills, diaphoresis, fatigue and fever.  HENT:  Negative for trouble swallowing and voice change.   Respiratory:  Negative for shortness of breath and wheezing.   Cardiovascular:  Negative for chest pain, palpitations and leg swelling.  Gastrointestinal:  Positive for constipation. Negative for abdominal distention, abdominal pain, anal bleeding, blood in stool, diarrhea, nausea and vomiting.  Musculoskeletal:  Negative for arthralgias and myalgias.  Skin:  Negative for color change and pallor.  Neurological:  Negative for dizziness, syncope and weakness.  Psychiatric/Behavioral:  Negative for confusion. The patient is not nervous/anxious.   All other systems reviewed and are negative.    Medications No current facility-administered medications on file prior to encounter.   Current Outpatient Medications on File Prior to Encounter  Medication Sig Dispense Refill   divalproex (DEPAKOTE ER) 250 MG 24 hr tablet Take 500 mg by mouth daily.     levETIRAcetam (KEPPRA) 500 MG tablet Take 3 tablets (1,500 mg total) by mouth 2 (two) times daily. 180 tablet 0   Multiple Vitamin (MULTIVITAMIN WITH MINERALS)  TABS tablet Take 1 tablet by mouth daily.     OXcarbazepine (TRILEPTAL) 150 MG tablet Take 150 mg by mouth 2 (two) times daily.     aspirin-sod bicarb-citric acid (ALKA-SELTZER) 325 MG TBEF tablet Take 325 mg by mouth every 6 (six) hours as needed (indigestion).     atorvastatin (LIPITOR) 80 MG tablet Take 80 mg  by mouth at bedtime.     divalproex (DEPAKOTE ER) 250 MG 24 hr tablet Take 750 mg by mouth at bedtime.     Docusate Sodium (DSS) 100 MG CAPS Take 200 mg by mouth 2 (two) times daily as needed.     rivaroxaban (XARELTO) 20 MG TABS tablet Take 20 mg by mouth daily with breakfast.      Pertinent medications related to GI and procedure were reviewed by me with the patient prior to the procedure   Current Facility-Administered Medications:    0.9 %  sodium chloride infusion, , Intravenous, Continuous, [name], DO, Last Rate: 20 mL/hr at 12/23/22 1153, New Bag at 12/23/22 1153      No Known Allergies Allergies were reviewed by me prior to the procedure  Objective   Body mass index is 23.19 kg/m. Vitals:   12/23/22 1137  BP: 116/74  Pulse: 63  Resp: 18  Temp: (!) 96.4 F (35.8 C)  TempSrc: Temporal  SpO2: 97%  Weight: 77.6 kg  Height: 6' (1.829 m)     Physical Exam Vitals and nursing note reviewed.  Constitutional:      General: He is not in acute distress.    Appearance: Normal appearance. He is not ill-appearing, toxic-appearing or diaphoretic.  HENT:     Head: Normocephalic and atraumatic.     Nose: Nose normal.     Mouth/Throat:     Mouth: Mucous membranes are moist.     Pharynx: Oropharynx is clear.  Eyes:     General: No scleral icterus.    Extraocular Movements: Extraocular movements intact.  Cardiovascular:     Rate and Rhythm: Normal rate and regular rhythm.     Heart sounds: Normal heart sounds. No murmur heard.    No friction rub. No gallop.  Pulmonary:     Effort: Pulmonary effort is normal. No respiratory distress.     Breath sounds: Normal breath sounds. No wheezing, rhonchi or rales.  Abdominal:     General: Bowel sounds are normal. There is no distension.     Palpations: Abdomen is soft.     Tenderness: There is no abdominal tenderness. There is no guarding or rebound.  Musculoskeletal:     Cervical back: Neck supple.     Right  lower leg: No edema.     Left lower leg: No edema.  Skin:    General: Skin is warm and dry.     Coloration: Skin is not jaundiced or pale.  Neurological:     Mental Status: He is alert and oriented to person, place, and time. Mental status is at baseline.  Psychiatric:     Comments: Flat affect; responds appropriately      Assessment:  [name] is a 72 y.o. male  who presents today for Esophagogastroduodenoscopy and Colonoscopy for Positive Cologuard and iron deficiency anemia .Marland Kitchen  Plan:  Esophagogastroduodenoscopy and Colonoscopy with possible intervention today  Esophagogastroduodenoscopy and Colonoscopy with possible biopsy, control of bleeding, polypectomy, and interventions as necessary has been discussed with the patient/patient representative. Informed consent was obtained from the patient/patient representative after explaining the  indication, nature, and risks of the procedure including but not limited to death, bleeding, perforation, missed neoplasm/lesions, cardiorespiratory compromise, and reaction to medications. Opportunity for questions was given and appropriate answers were provided. Patient/patient representative has verbalized understanding is amenable to undergoing the procedure.   Annamaria Helling, DO  [facility]  Portions of the record may have been created with voice recognition software. Occasional wrong-word or 'sound-a-like' substitutions may have occurred due to the inherent limitations of voice recognition software.  Read the chart carefully and recognize, using context, where substitutions may have occurred.

## 2022-12-22 NOTE — Anesthesia Preprocedure Evaluation (Signed)
Anesthesia Evaluation  Patient identified by MRN, date of birth, ID band Patient awake    Reviewed: Allergy & Precautions, NPO status , Patient's Chart, lab work & pertinent test results  History of Anesthesia Complications Negative for: history of anesthetic complications  Airway Mallampati: III  TM Distance: <3 FB Neck ROM: full    Dental  (+) Chipped, Poor Dentition, Missing   Pulmonary neg shortness of breath, former smoker   Pulmonary exam normal        Cardiovascular Exercise Tolerance: Good hypertension, + angina  + CAD, + Past MI and +CHF  Normal cardiovascular exam     Neuro/Psych Seizures -,  CVA  negative psych ROS   GI/Hepatic negative GI ROS, Neg liver ROS,neg GERD  ,,  Endo/Other  negative endocrine ROS    Renal/GU negative Renal ROS  negative genitourinary   Musculoskeletal   Abdominal   Peds  Hematology negative hematology ROS (+)   Anesthesia Other Findings Past Medical History: No date: Anemia No date: CHF (congestive heart failure) No date: Coronary artery disease No date: HLD (hyperlipidemia) No date: Hyperlipidemia No date: Hypertension No date: Hyponatremia No date: Seizures No date: Stroke  Past Surgical History: 12/21/2018: IR CT HEAD LTD 12/21/2018: IR PERCUTANEOUS ART THROMBECTOMY/INFUSION INTRACRANIAL INC  DIAG ANGIO No date: LAMINECTOMY 12/21/2018: RADIOLOGY WITH ANESTHESIA; N/A     Comment:  Procedure: RADIOLOGY WITH ANESTHESIA;  Surgeon:               Luanne Bras, MD;  Location: [place];  Service:               Radiology;  Laterality: N/A; No date: TONSILLECTOMY     Reproductive/Obstetrics negative OB ROS                             Anesthesia Physical Anesthesia Plan  ASA: 3  Anesthesia Plan: General   Post-op Pain Management:    Induction: Intravenous  PONV Risk Score and Plan: Propofol infusion and TIVA  Airway Management Planned:  Natural Airway and Nasal Cannula  Additional Equipment:   Intra-op Plan:   Post-operative Plan:   Informed Consent: I have reviewed the patients History and Physical, chart, labs and discussed the procedure including the risks, benefits and alternatives for the proposed anesthesia with the patient or authorized representative who has indicated his/her understanding and acceptance.     Dental Advisory Given  Plan Discussed with: Anesthesiologist, CRNA and Surgeon  Anesthesia Plan Comments: (Patient consented for risks of anesthesia including but not limited to:  - adverse reactions to medications - risk of airway placement if required - damage to eyes, teeth, lips or other oral mucosa - nerve damage due to positioning  - sore throat or hoarseness - Damage to heart, brain, nerves, lungs, other parts of body or loss of life  Patient voiced understanding.)       Anesthesia Quick Evaluation

## 2022-12-22 NOTE — OR Nursing (Addendum)
PT ARRIVED TO ENDO. [facility] REPORTS .PREP DID NOT START TO WORK AFTER 1ST HALF OF PREP.Marland Kitchen RN SPOKE WITH DR RUSSO. PT NEEDS TO BE RE-PREPPED D/T CONSTIPATION. PT TO BE RE-SCHEDULED FOR TOMORROW . STOOL IS DARK BROWN WITH FORMED STOOL. PROVIDED WRITTEN INSTRUCTIONS ON CLEAR LIQUIDS AND CONTINUE TO HOLD XARELTO

## 2022-12-23 ENCOUNTER — Encounter
Admission: RE | Disposition: A | Discharge status: Home / Self Care | Payer: Self-pay | Source: Home / Self Care | Attending: Gastroenterology

## 2022-12-23 ENCOUNTER — Other Ambulatory Visit: Payer: Self-pay

## 2022-12-23 ENCOUNTER — Ambulatory Visit
Admission: RE | Admit: 2022-12-23 | Discharge: 2022-12-23 | Disposition: A | Discharge status: Home / Self Care | Payer: Medicare HMO | Attending: Gastroenterology | Admitting: Gastroenterology

## 2022-12-23 ENCOUNTER — Ambulatory Visit: Payer: Medicare HMO | Admitting: Anesthesiology

## 2022-12-23 ENCOUNTER — Encounter: Payer: Self-pay | Admitting: Gastroenterology

## 2022-12-23 DIAGNOSIS — I2510 Atherosclerotic heart disease of native coronary artery without angina pectoris: Secondary | ICD-10-CM | POA: Diagnosis not present

## 2022-12-23 DIAGNOSIS — K2289 Other specified disease of esophagus: Secondary | ICD-10-CM | POA: Insufficient documentation

## 2022-12-23 DIAGNOSIS — D122 Benign neoplasm of ascending colon: Secondary | ICD-10-CM | POA: Diagnosis not present

## 2022-12-23 DIAGNOSIS — Z955 Presence of coronary angioplasty implant and graft: Secondary | ICD-10-CM | POA: Insufficient documentation

## 2022-12-23 DIAGNOSIS — D125 Benign neoplasm of sigmoid colon: Secondary | ICD-10-CM | POA: Diagnosis not present

## 2022-12-23 DIAGNOSIS — R569 Unspecified convulsions: Secondary | ICD-10-CM | POA: Insufficient documentation

## 2022-12-23 DIAGNOSIS — Z1211 Encounter for screening for malignant neoplasm of colon: Secondary | ICD-10-CM | POA: Diagnosis not present

## 2022-12-23 DIAGNOSIS — D509 Iron deficiency anemia, unspecified: Secondary | ICD-10-CM | POA: Diagnosis not present

## 2022-12-23 DIAGNOSIS — E785 Hyperlipidemia, unspecified: Secondary | ICD-10-CM | POA: Insufficient documentation

## 2022-12-23 DIAGNOSIS — Z8673 Personal history of transient ischemic attack (TIA), and cerebral infarction without residual deficits: Secondary | ICD-10-CM | POA: Insufficient documentation

## 2022-12-23 DIAGNOSIS — R195 Other fecal abnormalities: Secondary | ICD-10-CM | POA: Diagnosis not present

## 2022-12-23 DIAGNOSIS — B9681 Helicobacter pylori [H. pylori] as the cause of diseases classified elsewhere: Secondary | ICD-10-CM | POA: Diagnosis not present

## 2022-12-23 DIAGNOSIS — K219 Gastro-esophageal reflux disease without esophagitis: Secondary | ICD-10-CM | POA: Insufficient documentation

## 2022-12-23 DIAGNOSIS — I509 Heart failure, unspecified: Secondary | ICD-10-CM | POA: Insufficient documentation

## 2022-12-23 DIAGNOSIS — D123 Benign neoplasm of transverse colon: Secondary | ICD-10-CM | POA: Insufficient documentation

## 2022-12-23 DIAGNOSIS — I110 Hypertensive heart disease with heart failure: Secondary | ICD-10-CM | POA: Diagnosis not present

## 2022-12-23 DIAGNOSIS — K2950 Unspecified chronic gastritis without bleeding: Secondary | ICD-10-CM | POA: Diagnosis not present

## 2022-12-23 DIAGNOSIS — K5730 Diverticulosis of large intestine without perforation or abscess without bleeding: Secondary | ICD-10-CM | POA: Diagnosis not present

## 2022-12-23 DIAGNOSIS — I252 Old myocardial infarction: Secondary | ICD-10-CM | POA: Diagnosis not present

## 2022-12-23 DIAGNOSIS — K640 First degree hemorrhoids: Secondary | ICD-10-CM | POA: Insufficient documentation

## 2022-12-23 HISTORY — PX: ESOPHAGOGASTRODUODENOSCOPY: SHX5428

## 2022-12-23 HISTORY — PX: COLONOSCOPY: SHX5424

## 2022-12-23 SURGERY — EGD (ESOPHAGOGASTRODUODENOSCOPY)
Anesthesia: General

## 2022-12-23 MED ORDER — LIDOCAINE HCL (CARDIAC) PF 100 MG/5ML IV SOSY
PREFILLED_SYRINGE | INTRAVENOUS | Status: DC | PRN
  Administered 2022-12-23: 100 mg via INTRAVENOUS

## 2022-12-23 MED ORDER — PROPOFOL 10 MG/ML IV BOLUS
INTRAVENOUS | Status: DC | PRN
  Administered 2022-12-23: 150 ug/kg/min via INTRAVENOUS
  Administered 2022-12-23: 30 mg via INTRAVENOUS

## 2022-12-23 MED ORDER — SODIUM CHLORIDE 0.9 % IV SOLN: INTRAVENOUS | Status: DC

## 2022-12-23 NOTE — Op Note (Signed)
[facility] Gastroenterology Patient Name: Corey Meyers Procedure Date: 12/23/2022 1:03 PM MRN: [id number] Account #: [id number] Date of Birth: August 14, 1951 Admit Type: Outpatient Age: 72 Room: [facility] ENDO ROOM 1 Gender: Male Note Status: Finalized Instrument Name: Upper Endoscope [id number] Procedure:             Upper GI endoscopy Indications:           Iron deficiency anemia Providers:             Trenda Moots, DO Referring MD:          [name] DO, DO (Referring MD), Lauro Regulus MD, MD (Referring MD) Medicines:             Monitored Anesthesia Care Complications:         No immediate complications. Estimated blood loss:                         Minimal. Procedure:             Pre-Anesthesia Assessment:                        - Prior to the procedure, a History and Physical was                         performed, and patient medications and allergies were                         reviewed. The patient is competent. The risks and                         benefits of the procedure and the sedation options and                         risks were discussed with the patient. All questions                         were answered and informed consent was obtained.                         Patient identification and proposed procedure were                         verified by the physician, the nurse, the anesthetist                         and the technician in the endoscopy suite. Mental                         Status Examination: alert and oriented. Airway                         Examination: normal oropharyngeal airway and neck                         mobility. Respiratory Examination: clear to  auscultation. CV Examination: RRR, no murmurs, no S3                         or S4. Prophylactic Antibiotics: The patient does not                         require prophylactic antibiotics. Prior                          Anticoagulants: The patient has taken Xarelto                         (rivaroxaban), last dose was 4 days prior to                         procedure. ASA Grade Assessment: III - A patient with                         severe systemic disease. After reviewing the risks and                         benefits, the patient was deemed in satisfactory                         condition to undergo the procedure. The anesthesia                         plan was to use monitored anesthesia care (MAC).                         Immediately prior to administration of medications,                         the patient was re-assessed for adequacy to receive                         sedatives. The heart rate, respiratory rate, oxygen                         saturations, blood pressure, adequacy of pulmonary                         ventilation, and response to care were monitored                         throughout the procedure. The physical status of the                         patient was re-assessed after the procedure.                        After obtaining informed consent, the endoscope was                         passed under direct vision. Throughout the procedure,                         the patient's blood pressure, pulse, and oxygen  saturations were monitored continuously. The Endoscope                         was introduced through the mouth, and advanced to the                         second part of duodenum. The upper GI endoscopy was                         accomplished without difficulty. The patient tolerated                         the procedure well. Findings:      The duodenal bulb, first portion of the duodenum and second portion of       the duodenum were normal. Biopsies for histology were taken with a cold       forceps for evaluation of celiac disease. Estimated blood loss was       minimal.      Localized minimal inflammation characterized by erythema was  found in       the gastric antrum. Biopsies were taken with a cold forceps for       Helicobacter pylori testing. Estimated blood loss was minimal.      There were esophageal mucosal changes suspicious for short-segment       Barrett's esophagus, classified as Barrett's stage C0-M2 per [place]       criteria present at the gastroesophageal junction. The maximum       longitudinal extent of these mucosal changes was 2 cm in length. Mucosa       was biopsied with a cold forceps for histology. A total of 2 specimen       bottles were sent to pathology. Estimated blood loss was minimal.      Esophagogastric landmarks were identified: the gastroesophageal junction       was found at 44 cm from the incisors.      A single 3 mm submucosal nodule with a localized distribution was found       in the middle third of the esophagus, 25 cm from the incisors. biopsied       over area. nodule appeared hard to forceps. Estimated blood loss was       minimal.      The exam of the esophagus was otherwise normal. Impression:            - Normal duodenal bulb, first portion of the duodenum                         and second portion of the duodenum. Biopsied.                        - Gastritis. Biopsied.                        - Esophageal mucosal changes suspicious for                         short-segment Barrett's esophagus, classified as                         Barrett's stage C0-M2 per [place] criteria. Biopsied.                        -  Esophagogastric landmarks identified.                        - Submucosal nodule found in the esophagus. Recommendation:        - Patient has a contact number available for                         emergencies. The signs and symptoms of potential                         delayed complications were discussed with the patient.                         Return to normal activities tomorrow. Written                         discharge instructions were provided to the patient.                         - Discharge patient to home.                        - Resume previous diet.                        - Continue present medications.                        - See colonoscopy report for xarelto recommendations                        - Await pathology results.                        - Return to GI clinic as previously scheduled.                        - proceed with colonoscopy                        - The findings and recommendations were discussed with                         the patient.                        - The findings and recommendations were discussed with                         the patient's family. Procedure Code(s):     --- Professional ---                        [phone], Esophagogastroduodenoscopy, flexible,                         transoral; with biopsy, single or multiple Diagnosis Code(s):     --- Professional ---                        K22.70, Barrett's esophagus without dysplasia  K29.70, Gastritis, unspecified, without bleeding                        K22.89, Other specified disease of esophagus                        D50.9, Iron deficiency anemia, unspecified CPT copyright 2022 American Medical Association. All rights reserved. The codes documented in this report are preliminary and upon coder review may  be revised to meet current compliance requirements. Attending Participation:      I personally performed the entire procedure. Elfredia Nevins, DO [name] DO, DO 12/23/2022 1:32:50 PM This report has been signed electronically. Number of Addenda: 0 Note Initiated On: 12/23/2022 1:03 PM Estimated Blood Loss:  Estimated blood loss was minimal.      [facility]

## 2022-12-23 NOTE — Anesthesia Postprocedure Evaluation (Signed)
Anesthesia Post Note  Patient: Corey Meyers  Procedure(s) Performed: ESOPHAGOGASTRODUODENOSCOPY (EGD) COLONOSCOPY  Patient location during evaluation: PACU Anesthesia Type: General Level of consciousness: sedated Vital Signs Assessment: post-procedure vital signs reviewed and stable Respiratory status: spontaneous breathing and respiratory function stable Cardiovascular status: blood pressure returned to baseline Anesthetic complications: no   There were no known notable events for this encounter.   Last Vitals:  Vitals:   12/23/22 1253 12/23/22 1435  BP:  105/69  Pulse:  (!) 57  Resp:  14  Temp: 37 C (!) 35.6 C  SpO2:  99%    Last Pain:  Vitals:   12/23/22 1435  TempSrc: Temporal  PainSc: Asleep                 VAN STAVEREN,Sammuel Blick

## 2022-12-23 NOTE — H&P (Signed)
Procedure postponed to 12/23/22 due to poor prep.

## 2022-12-23 NOTE — Anesthesia Preprocedure Evaluation (Signed)
Anesthesia Evaluation  Patient identified by MRN, date of birth, ID band Patient awake and Patient unresponsive    Reviewed: Allergy & Precautions, NPO status , Patient's Chart, lab work & pertinent test results  Airway Mallampati: II  TM Distance: >3 FB Neck ROM: Limited    Dental  (+) Missing, Poor Dentition, Dental Advisory Given   Pulmonary neg pulmonary ROS, former smoker   Pulmonary exam normal  + decreased breath sounds      Cardiovascular Exercise Tolerance: Poor hypertension, + CAD, + Cardiac Stents and +CHF  negative cardio ROS Normal cardiovascular exam Rhythm:Regular Rate:Normal     Neuro/Psych Seizures -,  CVA, Residual Symptoms negative neurological ROS  negative psych ROS   GI/Hepatic negative GI ROS, Neg liver ROS,,,  Endo/Other  negative endocrine ROS    Renal/GU negative Renal ROS  negative genitourinary   Musculoskeletal   Abdominal Normal abdominal exam  (+)   Peds negative pediatric ROS (+)  Hematology negative hematology ROS (+) Blood dyscrasia, anemia   Anesthesia Other Findings Past Medical History: No date: Anemia No date: CHF (congestive heart failure) No date: Coronary artery disease No date: HLD (hyperlipidemia) No date: Hyperlipidemia No date: Hypertension No date: Hyponatremia No date: Myocardial infarction No date: Seizures     Comment:  last seizure March 2023 No date: Stroke  Past Surgical History: 12/22/2022: COLONOSCOPY; N/A     Comment:  Procedure: COLONOSCOPY;  Surgeon: [name],              DO;  Location: [facility] ENDOSCOPY;  Service:               Gastroenterology;  Laterality: N/A; 12/22/2022: ESOPHAGOGASTRODUODENOSCOPY; N/A     Comment:  Procedure: ESOPHAGOGASTRODUODENOSCOPY (EGD);  Surgeon:               [name], DO;  Location: [facility] ENDOSCOPY;                Service: Gastroenterology;  Laterality: N/A; 12/21/2018: IR CT HEAD LTD 12/21/2018: IR  PERCUTANEOUS ART THROMBECTOMY/INFUSION INTRACRANIAL INC  DIAG ANGIO No date: LAMINECTOMY 12/21/2018: RADIOLOGY WITH ANESTHESIA; N/A     Comment:  Procedure: RADIOLOGY WITH ANESTHESIA;  Surgeon:               Julieanne Cotton, MD;  Location: [facility] OR;  Service:               Radiology;  Laterality: N/A; No date: TONSILLECTOMY  BMI    Body Mass Index: 23.19 kg/m      Reproductive/Obstetrics negative OB ROS                             Anesthesia Physical Anesthesia Plan  ASA: 3  Anesthesia Plan: General   Post-op Pain Management:    Induction: Intravenous  PONV Risk Score and Plan: Propofol infusion and TIVA  Airway Management Planned: Natural Airway  Additional Equipment:   Intra-op Plan:   Post-operative Plan:   Informed Consent: I have reviewed the patients History and Physical, chart, labs and discussed the procedure including the risks, benefits and alternatives for the proposed anesthesia with the patient or authorized representative who has indicated his/her understanding and acceptance.     Dental Advisory Given  Plan Discussed with: CRNA and Surgeon  Anesthesia Plan Comments:        Anesthesia Quick Evaluation

## 2022-12-23 NOTE — Op Note (Signed)
[facility] Gastroenterology Patient Name: Corey Meyers Procedure Date: 12/23/2022 1:02 PM MRN: [id number] Account #: [id number] Date of Birth: 30-Jun-1951 Admit Type: Outpatient Age: 72 Room: [facility] ENDO ROOM 1 Gender: Male Note Status: Finalized Instrument Name: Colonoscope [id number] Procedure:             Colonoscopy Indications:           Positive Cologuard test Providers:             Trenda Moots, DO Referring MD:          [name] DO, DO (Referring MD), Lauro Regulus MD, MD (Referring MD) Medicines:             Monitored Anesthesia Care Complications:         No immediate complications. Estimated blood loss:                         Minimal. Procedure:             Pre-Anesthesia Assessment:                        - Prior to the procedure, a History and Physical was                         performed, and patient medications and allergies were                         reviewed. The patient is competent. The risks and                         benefits of the procedure and the sedation options and                         risks were discussed with the patient. All questions                         were answered and informed consent was obtained.                         Patient identification and proposed procedure were                         verified by the physician, the nurse, the anesthetist                         and the technician in the endoscopy suite. Mental                         Status Examination: alert and oriented. Airway                         Examination: normal oropharyngeal airway and neck                         mobility. Respiratory Examination: clear to  auscultation. CV Examination: RRR, no murmurs, no S3                         or S4. Prophylactic Antibiotics: The patient does not                         require prophylactic antibiotics. Prior                          Anticoagulants: The patient has taken Xarelto                         (rivaroxaban), last dose was 4 days prior to                         procedure. ASA Grade Assessment: III - A patient with                         severe systemic disease. After reviewing the risks and                         benefits, the patient was deemed in satisfactory                         condition to undergo the procedure. The anesthesia                         plan was to use monitored anesthesia care (MAC).                         Immediately prior to administration of medications,                         the patient was re-assessed for adequacy to receive                         sedatives. The heart rate, respiratory rate, oxygen                         saturations, blood pressure, adequacy of pulmonary                         ventilation, and response to care were monitored                         throughout the procedure. The physical status of the                         patient was re-assessed after the procedure.                        After obtaining informed consent, the colonoscope was                         passed under direct vision. Throughout the procedure,                         the patient's blood pressure, pulse, and oxygen  saturations were monitored continuously. The                         Colonoscope was introduced through the anus and                         advanced to the the terminal ileum, with                         identification of the appendiceal orifice and IC                         valve. The colonoscopy was performed without                         difficulty. The patient tolerated the procedure well.                         The quality of the bowel preparation was evaluated                         using the BBPS [facility] Bowel Preparation Scale) with                         scores of: Right Colon = 2 (minor amount of residual                          staining, small fragments of stool and/or opaque                         liquid, but mucosa seen well), Transverse Colon = 2                         (minor amount of residual staining, small fragments of                         stool and/or opaque liquid, but mucosa seen well) and                         Left Colon = 2 (minor amount of residual staining,                         small fragments of stool and/or opaque liquid, but                         mucosa seen well). The total BBPS score equals 6. The                         quality of the bowel preparation was good. The                         terminal ileum, ileocecal valve, appendiceal orifice,                         and rectum were photographed. Findings:      The perianal and digital rectal examinations were normal. Pertinent       negatives include normal  sphincter tone.      The terminal ileum appeared normal. Estimated blood loss: none.      Multiple small-mouthed diverticula were found in the left colon.       Estimated blood loss: none.      Non-bleeding internal hemorrhoids were found during retroflexion. The       hemorrhoids were Grade I (internal hemorrhoids that do not prolapse).       Estimated blood loss: none.      Retroflexion in the right colon was performed.      Four sessile polyps were found in the rectum (3) and transverse colon       (1). The polyps were 1 to 2 mm in size. These polyps were removed with a       jumbo cold forceps. Resection and retrieval were complete. Estimated       blood loss was minimal.      A 7 to 8 mm polyp was found in the sigmoid colon. The polyp was       pedunculated. The polyp was removed with a hot snare. Resection and       retrieval were complete. Estimated blood loss was minimal.      Ten sessile polyps were found in the rectum (2), sigmoid colon (2),       transverse colon (4) and ascending colon (2). The polyps were 3 to 7 mm       in size. These polyps were removed with a  cold snare. Resection and       retrieval were complete. Estimated blood loss was minimal.      The exam was otherwise without abnormality on direct and retroflexion       views. Impression:            - The examined portion of the ileum was normal.                        - Diverticulosis in the left colon.                        - Non-bleeding internal hemorrhoids.                        - Four 1 to 2 mm polyps in the rectum and in the                         transverse colon, removed with a jumbo cold forceps.                         Resected and retrieved.                        - One 7 to 8 mm polyp in the sigmoid colon, removed                         with a hot snare. Resected and retrieved.                        - Ten 3 to 7 mm polyps in the rectum, in the sigmoid                         colon, in the transverse  colon and in the ascending                         colon, removed with a cold snare. Resected and                         retrieved.                        - The examination was otherwise normal on direct and                         retroflexion views. Recommendation:        - Patient has a contact number available for                         emergencies. The signs and symptoms of potential                         delayed complications were discussed with the patient.                         Return to normal activities tomorrow. Written                         discharge instructions were provided to the patient.                        - Discharge patient to home.                        - Resume previous diet.                        - Continue present medications.                        - No aspirin, ibuprofen, naproxen, or other                         non-steroidal anti-inflammatory drugs for 5 days after                         polyp removal.                        - Resume Xarelto (rivaroxaban) at prior dose in 4                         days. Refer to managing  physician for further                         adjustment of therapy.                        - Continue present medications.                        - Await pathology results.                        - Repeat colonoscopy in  1 year for surveillance based                         on pathology results.                        - Return to GI office as previously scheduled.                        - The findings and recommendations were discussed with                         the patient.                        - The findings and recommendations were discussed with                         the patient's family. Procedure Code(s):     --- Professional ---                        [phone], Colonoscopy, flexible; with removal of                         tumor(s), polyp(s), or other lesion(s) by snare                         technique                        45380, 59, Colonoscopy, flexible; with biopsy, single                         or multiple Diagnosis Code(s):     --- Professional ---                        K64.0, First degree hemorrhoids                        D12.8, Benign neoplasm of rectum                        D12.5, Benign neoplasm of sigmoid colon                        D12.3, Benign neoplasm of transverse colon (hepatic                         flexure or splenic flexure)                        D12.2, Benign neoplasm of ascending colon                        R19.5, Other fecal abnormalities                        K57.30, Diverticulosis of large intestine without                         perforation or abscess without bleeding CPT copyright 2022 American Medical Association. All  rights reserved. The codes documented in this report are preliminary and upon coder review may  be revised to meet current compliance requirements. Attending Participation:      I personally performed the entire procedure. Elfredia NevinsSteven Clarice Zulauf, DO Jaynie CollinsSteven Michael Fidela Cieslak DO, DO 12/23/2022 2:38:04 PM This report has been signed  electronically. Number of Addenda: 0 Note Initiated On: 12/23/2022 1:02 PM Scope Withdrawal Time: 0 hours 47 minutes 21 seconds  Total Procedure Duration: 0 hours 54 minutes 53 seconds  Estimated Blood Loss:  Estimated blood loss was minimal.      [facility]

## 2022-12-23 NOTE — Interval H&P Note (Signed)
History and Physical Interval Note: Preprocedure H&P from 12/23/22  was reviewed and there was no interval change after seeing and examining the patient.  Written consent was obtained from the patient after discussion of risks, benefits, and alternatives. Patient has consented to proceed with Esophagogastroduodenoscopy and Colonoscopy with possible intervention   12/23/2022 12:25 PM  Corey Meyers  has presented today for surgery, with the diagnosis of Positive colorectal cancer screening using Cologuard test (R19.5) IDA.  The various methods of treatment have been discussed with the patient and family. After consideration of risks, benefits and other options for treatment, the patient has consented to  Procedure(s): ESOPHAGOGASTRODUODENOSCOPY (EGD) (N/A) COLONOSCOPY (N/A) as a surgical intervention.  The patient's history has been reviewed, patient examined, no change in status, stable for surgery.  I have reviewed the patient's chart and labs.  Questions were answered to the patient's satisfaction.     [name]

## 2022-12-23 NOTE — Transfer of Care (Signed)
Immediate Anesthesia Transfer of Care Note  Patient: Corey Meyers  Procedure(s) Performed: ESOPHAGOGASTRODUODENOSCOPY (EGD) COLONOSCOPY  Patient Location: PACU  Anesthesia Type:General  Level of Consciousness: drowsy  Airway & Oxygen Therapy: Patient Spontanous Breathing and Patient connected to nasal cannula oxygen  Post-op Assessment: Report given to RN and Post -op Vital signs reviewed and stable  Post vital signs: stable  Last Vitals:  Vitals Value Taken Time  BP    Temp    Pulse    Resp    SpO2      Last Pain:  Vitals:   12/23/22 1253  TempSrc: Temporal         Complications: No notable events documented.

## 2022-12-26 ENCOUNTER — Encounter: Payer: Self-pay | Admitting: Gastroenterology

## 2022-12-27 LAB — SURGICAL PATHOLOGY

## 2023-05-02 ENCOUNTER — Other Ambulatory Visit: Payer: Self-pay | Admitting: Gastroenterology

## 2023-05-02 DIAGNOSIS — Z8619 Personal history of other infectious and parasitic diseases: Secondary | ICD-10-CM

## 2023-05-03 ENCOUNTER — Other Ambulatory Visit: Payer: Self-pay | Admitting: Gastroenterology

## 2023-05-03 DIAGNOSIS — R634 Abnormal weight loss: Secondary | ICD-10-CM

## 2023-05-03 DIAGNOSIS — Z8619 Personal history of other infectious and parasitic diseases: Secondary | ICD-10-CM

## 2023-05-09 ENCOUNTER — Ambulatory Visit
Admission: RE | Admit: 2023-05-09 | Discharge: 2023-05-09 | Disposition: A | Discharge status: Home / Self Care | Payer: Medicare HMO | Source: Ambulatory Visit | Attending: Gastroenterology | Admitting: Gastroenterology

## 2023-05-09 DIAGNOSIS — Z8619 Personal history of other infectious and parasitic diseases: Secondary | ICD-10-CM | POA: Insufficient documentation

## 2023-05-09 DIAGNOSIS — R634 Abnormal weight loss: Secondary | ICD-10-CM | POA: Insufficient documentation

## 2023-05-09 MED ORDER — IOHEXOL 300 MG/ML  SOLN
100.0000 mL | Freq: Once | INTRAMUSCULAR | Status: AC | PRN
  Administered 2023-05-09: 100 mL via INTRAVENOUS

## 2023-06-23 ENCOUNTER — Telehealth: Payer: Self-pay

## 2023-06-23 NOTE — Telephone Encounter (Signed)
Request received for EUS for esophageal nodule. This request was previously received in 12/2022 and was reviewed by [facility]. Due to cardiology history, needs to be performed in OR and cannot be performed at [facility]. [facility] notified.

## 2023-06-27 ENCOUNTER — Ambulatory Visit: Payer: Medicare HMO | Admitting: Urology

## 2023-07-05 ENCOUNTER — Ambulatory Visit: Payer: Medicare HMO | Admitting: Urology

## 2023-07-05 VITALS — BP 101/67 | HR 73 | Ht 72.0 in | Wt 157.0 lb

## 2023-07-05 DIAGNOSIS — N210 Calculus in bladder: Secondary | ICD-10-CM | POA: Diagnosis not present

## 2023-07-05 LAB — URINALYSIS, COMPLETE
Bilirubin, UA: NEGATIVE
Glucose, UA: NEGATIVE
Leukocytes,UA: NEGATIVE
Nitrite, UA: NEGATIVE
RBC, UA: NEGATIVE
Specific Gravity, UA: 1.02 (ref 1.005–1.030)
Urobilinogen, Ur: 1 mg/dL (ref 0.2–1.0)
pH, UA: 7 (ref 5.0–7.5)

## 2023-07-05 LAB — MICROSCOPIC EXAMINATION

## 2023-07-05 NOTE — Patient Instructions (Signed)

## 2023-07-05 NOTE — Progress Notes (Signed)
Marcelle Overlie Plume,acting as a scribe for Vanna Scotland, MD.,have documented all relevant documentation on the behalf of Vanna Scotland, MD,as directed by  Vanna Scotland, MD while in the presence of Vanna Scotland, MD.  07/05/2023 2:49 PM   Corey Meyers 1951/04/01 [id number]  Referring provider: Wynona Canes No address on file  Chief Complaint  Patient presents with   Establish Care    HPI: 72 year-old male who presents today for further evaluation of incidental bladder calcifications.   Notably, at a CT abdomen pelvis with contrast in the setting of a positive cologuard and chronic constipation, he was known to have incidental prostatomegaly and calcifications along the bladder wall presumably some of which were not necessarily layering dependently. A urologic consultation was recommended.   His most recent PSA on 05/26/2023 was 1.42.   His urinalysis today is negative.   He denies any urinary symptoms such as difficulty urinating, feeling of incomplete bladder emptying, infections, or hematuria.  He has a history of smoking for 10 years, quitting in 2014.    PMH: Past Medical History:  Diagnosis Date   Anemia    CHF (congestive heart failure) (HCC)    Coronary artery disease    HLD (hyperlipidemia)    Hyperlipidemia    Hypertension    Hyponatremia    Myocardial infarction [facility])    Seizures (HCC)    last seizure March 2023   Stroke [facility])     Surgical History: Past Surgical History:  Procedure Laterality Date   COLONOSCOPY N/A 12/22/2022   Procedure: COLONOSCOPY;  Surgeon: [name], DO;  Location: [facility] ENDOSCOPY;  Service: Gastroenterology;  Laterality: N/A;   COLONOSCOPY N/A 12/23/2022   Procedure: COLONOSCOPY;  Surgeon: [name], DO;  Location: [facility] ENDOSCOPY;  Service: Gastroenterology;  Laterality: N/A;   ESOPHAGOGASTRODUODENOSCOPY N/A 12/22/2022   Procedure: ESOPHAGOGASTRODUODENOSCOPY (EGD);  Surgeon: [name],  DO;  Location: [facility] ENDOSCOPY;  Service: Gastroenterology;  Laterality: N/A;   ESOPHAGOGASTRODUODENOSCOPY N/A 12/23/2022   Procedure: ESOPHAGOGASTRODUODENOSCOPY (EGD);  Surgeon: [name], DO;  Location: [facility] ENDOSCOPY;  Service: Gastroenterology;  Laterality: N/A;   IR CT HEAD LTD  12/21/2018   IR PERCUTANEOUS ART THROMBECTOMY/INFUSION INTRACRANIAL INC DIAG ANGIO  12/21/2018   LAMINECTOMY     RADIOLOGY WITH ANESTHESIA N/A 12/21/2018   Procedure: RADIOLOGY WITH ANESTHESIA;  Surgeon: Julieanne Cotton, MD;  Location: [facility] OR;  Service: Radiology;  Laterality: N/A;   TONSILLECTOMY      Home Medications:  Allergies as of 07/05/2023   No Known Allergies      Medication List        Accurate as of July 05, 2023  2:49 PM. If you have any questions, ask your nurse or doctor.          STOP taking these medications    aspirin-sod bicarb-citric acid 325 MG Tbef tablet Commonly known as: ALKA-SELTZER       TAKE these medications    aspirin EC 81 MG tablet Take 81 mg by mouth daily.   atorvastatin 80 MG tablet Commonly known as: LIPITOR Take 80 mg by mouth at bedtime.   divalproex 250 MG 24 hr tablet Commonly known as: DEPAKOTE ER Take 250 mg by mouth. 2 tablets in the morning and 3 at night   DSS 100 MG Caps Take by mouth. Take 100 mg by mouth 4 (four) times daily What changed: Another medication with the same name was removed. Continue taking this medication, and follow the  directions you see here.   ferrous sulfate 325 (65 FE) MG EC tablet Take 325 mg by mouth daily with breakfast.   levETIRAcetam 750 MG tablet Commonly known as: KEPPRA Take by mouth. 1 tablet in the morning and 2 at bedtime   multivitamin with minerals Tabs tablet Take 1 tablet by mouth daily.   nitroGLYCERIN 0.4 MG/SPRAY spray Commonly known as: NITROLINGUAL Place 1 spray under the tongue every 5 (five) minutes x 3 doses as needed for chest pain.   OXcarbazepine 150 MG tablet Commonly  known as: TRILEPTAL Take 150 mg by mouth 2 (two) times daily.   polyethylene glycol-electrolytes 420 g solution Commonly known as: NuLYTELY See admin instructions.   rivaroxaban 20 MG Tabs tablet Commonly known as: XARELTO Take 20 mg by mouth daily with breakfast.         Social History:  reports that he has quit smoking. He has never used smokeless tobacco. He reports current alcohol use. No history on file for drug use.   Physical Exam: BP 101/67   Pulse 73   Ht 6' (1.829 m)   Wt 157 lb (71.2 kg)   BMI 21.29 kg/m   Constitutional:  Alert and oriented, No acute distress. HEENT: [place] AT, moist mucus membranes.  Trachea midline, no masses. Neurologic: Grossly intact, no focal deficits, moving all 4 extremities. Psychiatric: Normal mood and affect.  Pertinent Imaging: EXAM: CT ABDOMEN AND PELVIS WITH CONTRAST   TECHNIQUE: Multidetector CT imaging of the abdomen and pelvis was performed using the standard protocol following bolus administration of intravenous contrast.   RADIATION DOSE REDUCTION: This exam was performed according to the departmental dose-optimization program which includes automated exposure control, adjustment of the mA and/or kV according to patient size and/or use of iterative reconstruction technique.   CONTRAST:  OMNIPAQUE IOHEXOL 300 MG/ML  SOLN   COMPARISON:  None Available.   FINDINGS: Lower chest: No acute abnormality   Hepatobiliary: No focal hepatic abnormality. Gallbladder unremarkable.   Pancreas: No focal abnormality or ductal dilatation.   Spleen: No focal abnormality.  Normal size.   Adrenals/Urinary Tract: No adrenal abnormality. No focal renal abnormality. No stones or hydronephrosis. Few calcifications noted along the bladder wall, most posteriorly, but many of these do not appear dependent. It is unclear if these represent small bladder stones or bladder wall calcifications. No bladder wall thickening.    Stomach/Bowel: Large stool burden throughout the colon. No evidence of bowel obstruction. Stomach and small bowel decompressed, unremarkable.   Vascular/Lymphatic: No evidence of aneurysm or adenopathy. Aortic calcifications.   Reproductive: Prostate enlargement.   Other: No free fluid or free air.   Musculoskeletal: No acute bony abnormality. Moderate degenerative disc disease and advanced degenerative facet disease throughout the lumbar spine.   IMPRESSION: Large stool burden throughout the colon compatible with constipation.   Prostate enlargement.   Calcifications along the bladder wall, many of which are not layering dependently. It is unclear if these reflect small bladder stones or bladder wall calcifications. Consider urologic consultation.   Aortic atherosclerosis.     Electronically Signed   By: Charlett Nose M.D.   On: 05/16/2023 07:51  This was personally reviewed and I agree with the radiologic interpretation.   Results for orders placed or performed in visit on 07/05/23  Microscopic Examination   Urine  Result Value Ref Range   WBC, UA 0-5 0 - 5 /hpf   RBC, Urine 0-2 0 - 2 /hpf   Epithelial Cells (non renal)  0-10 0 - 10 /hpf   Casts Present (A) None seen /lpf   Cast Type Hyaline casts N/A   Crystals Present (A) N/A   Crystal Type Calcium Oxalate N/A   Mucus, UA Present (A) Not Estab.   Bacteria, UA Few None seen/Few  Urinalysis, Complete  Result Value Ref Range   Specific Gravity, UA 1.020 1.005 - 1.030   pH, UA 7.0 5.0 - 7.5   Color, UA Yellow Yellow   Appearance Ur Clear Clear   Leukocytes,UA Negative Negative   Protein,UA Trace (A) Negative/Trace   Glucose, UA Negative Negative   Ketones, UA Trace (A) Negative   RBC, UA Negative Negative   Bilirubin, UA Negative Negative   Urobilinogen, Ur 1.0 0.2 - 1.0 mg/dL   Nitrite, UA Negative Negative   Microscopic Examination See below:     Assessment & Plan:    1. Calculus of bladder/  bladder calcification - Differential diagnosis includes bladder stones or a calcified bladder tumor. Given the absence of urinary symptoms and a normal PSA level, the likelihood of prostate-related issues is low. - Cystoscopy is planned to visually inspect the bladder and rule out the presence of a calcified tumor   Return for cysto.  I have reviewed the above documentation for accuracy and completeness, and I agree with the above.   Vanna Scotland, MD    [facility] [address], [address] [place], [place] [address] [phone]

## 2023-08-15 ENCOUNTER — Encounter: Payer: Self-pay | Admitting: Urology

## 2023-08-15 ENCOUNTER — Ambulatory Visit: Payer: Medicare HMO | Admitting: Urology

## 2023-08-15 VITALS — BP 108/73 | HR 81 | Ht 72.0 in | Wt 155.5 lb

## 2023-08-15 DIAGNOSIS — R9341 Abnormal radiologic findings on diagnostic imaging of renal pelvis, ureter, or bladder: Secondary | ICD-10-CM

## 2023-08-15 DIAGNOSIS — N210 Calculus in bladder: Secondary | ICD-10-CM

## 2023-08-15 NOTE — Progress Notes (Signed)
   08/15/23  CC:  Chief Complaint  Patient presents with   Cysto    HPI: 72 year old male who presents today for cystoscopy.  Notably, he underwent a CT scan for positive Cologuard and chronic constipation.  Incidentally, calcifications were seen within the bladder that were not necessarily layering dependently.  He has no urinary symptoms and his urinalysis is otherwise negative.  There were no vitals taken for this visit. NED. A&Ox3.   No respiratory distress   Abd soft, NT, ND Normal phallus with bilateral descended testicles  Cystoscopy Procedure Note  Patient identification was confirmed, informed consent was obtained, and patient was prepped using Betadine solution.  Lidocaine jelly was administered per urethral meatus.     Pre-Procedure: - Inspection reveals a normal caliber ureteral meatus.  Procedure: The flexible cystoscope was introduced without difficulty - No urethral strictures/lesions are present. - Enlarged prostate  - Elevated bladder neck - Bilateral ureteral orifices identified - Bladder mucosa  reveals no ulcers, tumors, or lesions - No bladder stones - Mild trabeculation  Retroflexion shows unremarkable   Post-Procedure: - Patient tolerated the procedure well  Assessment/ Plan:  1. Calculus of bladder Incidental calcification seen on previous CT scan  Cystoscopy today was unremarkable other than for some mild prostamegaly.  There is no underlying bladder tumors or calcium deposits seen on today's cystoscopy which is reassuring.  Follow-up urine  - Urinalysis, Complete   Vanna Scotland, MD

## 2023-09-29 IMAGING — MR MR HEAD W/O CM
13 series · 43 of 48 positions shown · non-contrast
Comparison: 04/22/2021

CLINICAL DATA: Encephalopathy

EXAM:
MRI HEAD WITHOUT CONTRAST
TECHNIQUE: Multiplanar, multiecho pulse sequences of the brain and surrounding
structures were obtained without intravenous contrast.

[Series 5: ax dwi_tracew · axial · 3.0mm · 0.65mm/px · z∈[+26,+177]mm · 3 of 48 slices shown]
[im 1/48]
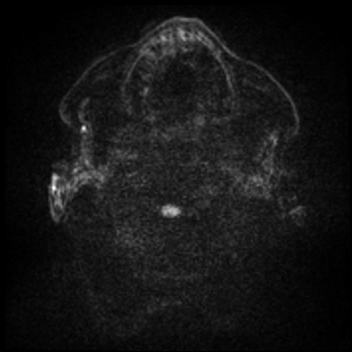
[im 24/48]
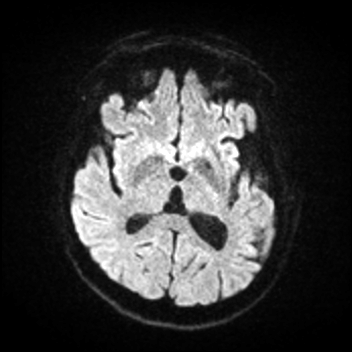
[im 48/48]
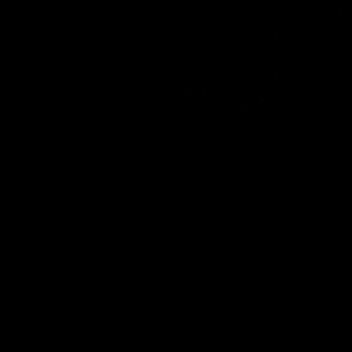

[Series 6: ax dwi_adc · axial · 3.0mm · 0.65mm/px · z∈[+26,+168]mm · 3 of 45 slices shown]
[im 1/45]
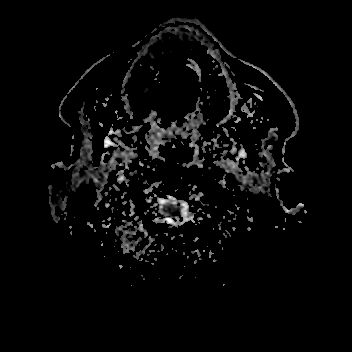
[im 23/45]
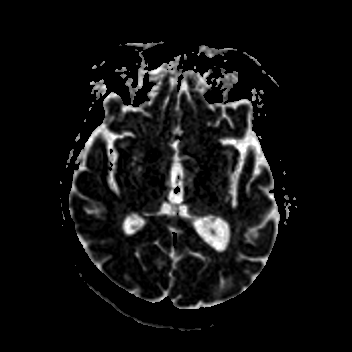
[im 45/45]
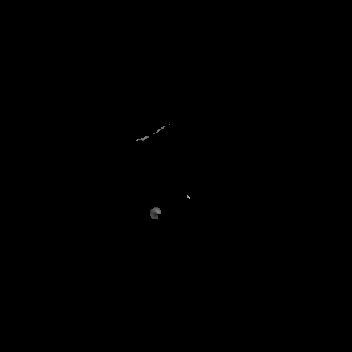

[Series 7: cor dwi_tracew · coronal · 5.0mm · 0.65mm/px · 3 of 40 slices shown]
[im 1/40]
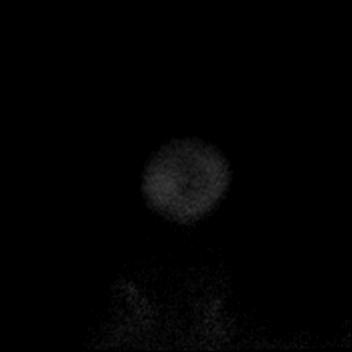
[im 20/40]
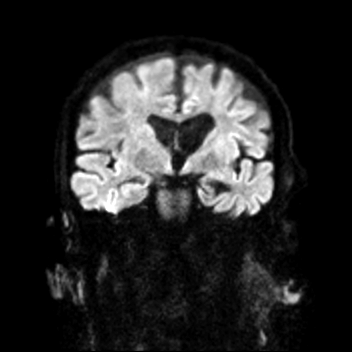
[im 40/40]
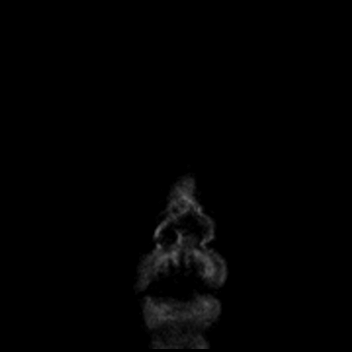

[Series 8: cor dwi_adc · coronal · 5.0mm · 0.65mm/px · 3 of 39 slices shown]
[im 1/39]
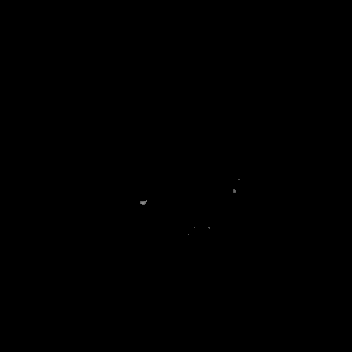
[im 20/39]
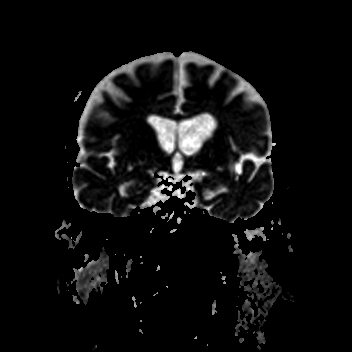
[im 39/39]
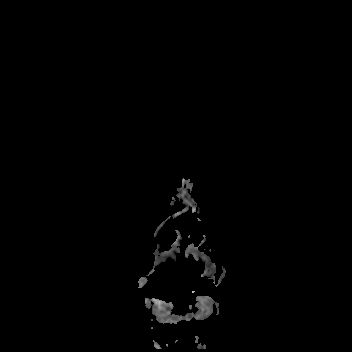

[Series 9: T1 · sagittal · 5.0mm · 0.94mm/px · 1 of 22 slices shown (1 of 2)]
[im 1/22]
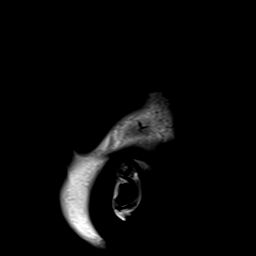

[Series 10: T2 · axial · 5.0mm · 0.53mm/px · z∈[+30,+171]mm · 2 of 25 slices shown (1 of 2)]
[im 1/25]
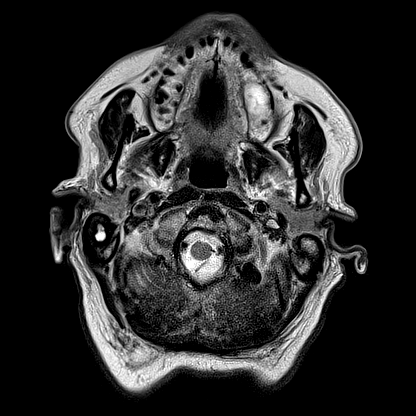
[im 25/25]
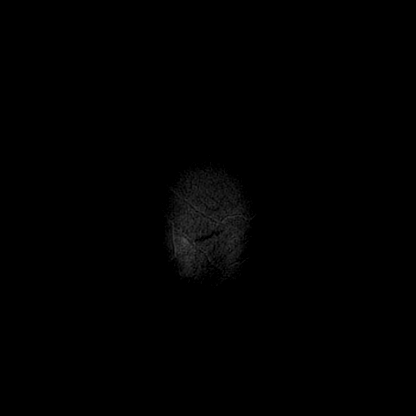

[Series 12: pha_images · axial · 3.0mm · 0.90mm/px · z∈[+17,+184]mm · 4 of 57 slices shown]
[im 1/57]
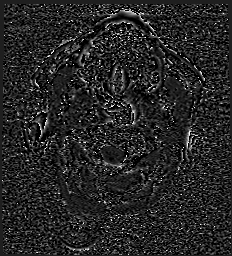
[im 19/57]
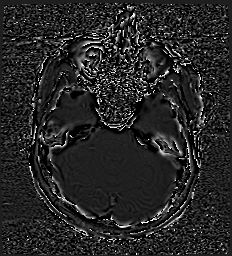
[im 38/57]
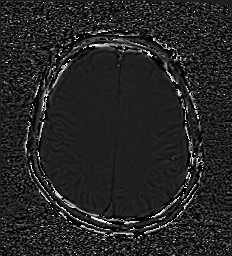
[im 57/57]
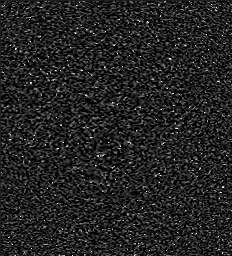

[Series 13: swi_images · axial · 3.0mm · 0.90mm/px · z∈[+14,+187]mm · 4 of 60 slices shown]
[im 1/60]
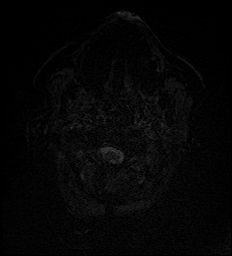
[im 20/60]
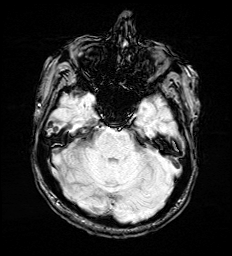
[im 40/60]
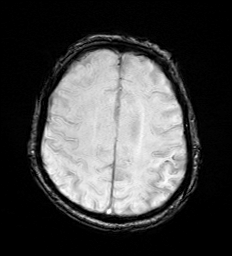
[im 60/60]
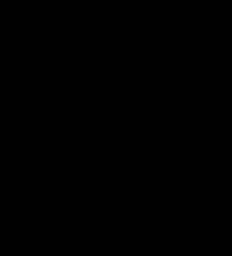

[Series 15: FLAIR · axial · 3.0mm · 0.69mm/px · z∈[+21,+180]mm · 4 of 55 slices shown (1 of 2)]
[im 1/55]
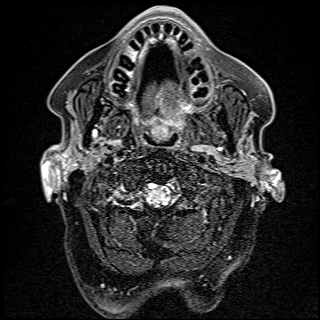
[im 19/55]
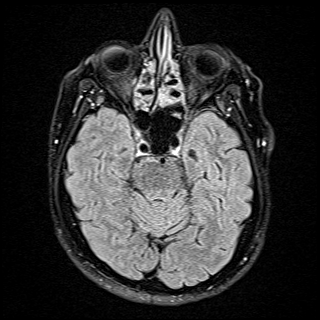
[im 37/55]
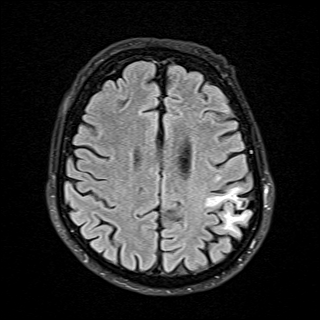
[im 55/55]
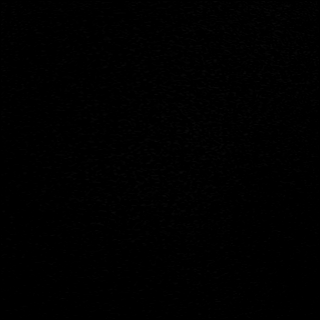

[Series 16: T1 · axial · 1.0mm · 0.98mm/px · z∈[+33,+173]mm · 8 of 144 slices shown (2 of 2)]
[im 1/144]
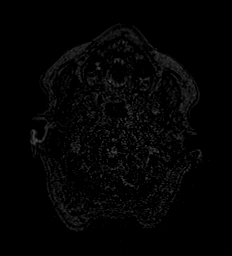
[im 16/144]
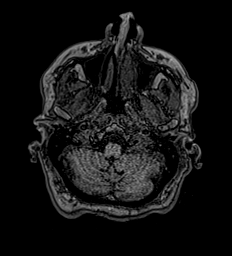
[im 48/144]
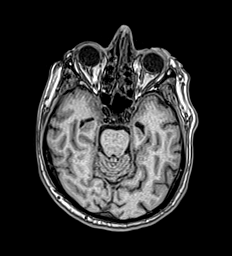
[im 64/144]
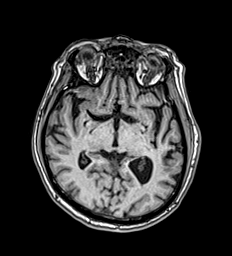
[im 80/144]
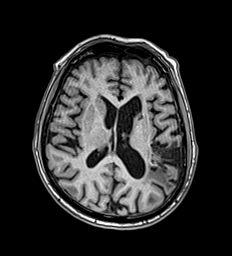
[im 96/144]
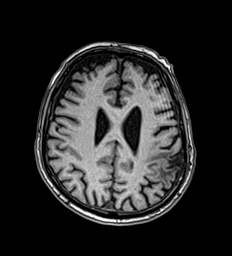
[im 128/144]
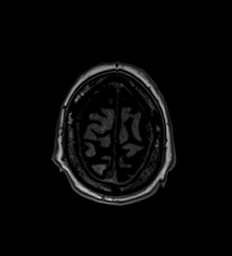
[im 144/144]
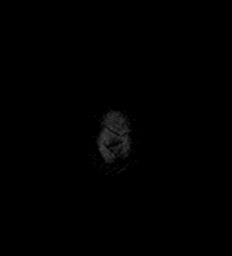

[Series 17: T2 · coronal · 3.0mm · 0.70mm/px · 2 of 35 slices shown (2 of 2)]
[im 1/35]
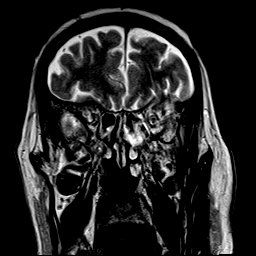
[im 35/35]
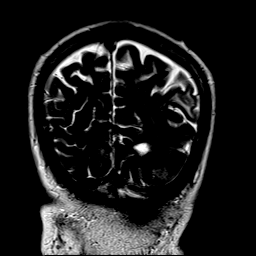

[Series 18: FLAIR · coronal · 3.0mm · 0.70mm/px · 2 of 35 slices shown (2 of 2)]
[im 1/35]
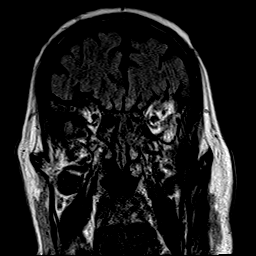
[im 35/35]
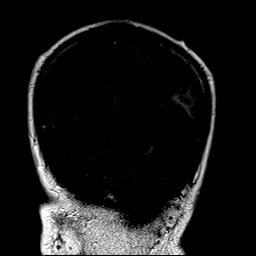

[Series 1029: cor thins rl · coronal · 3.0mm · 0.49mm/px · 4 of 107 slices shown]
[im 1/107]
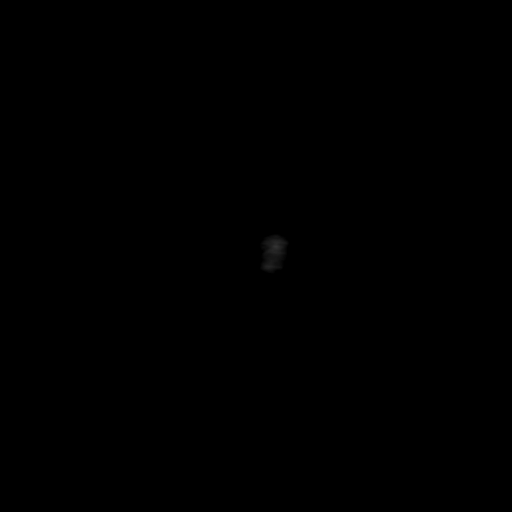
[im 18/107]
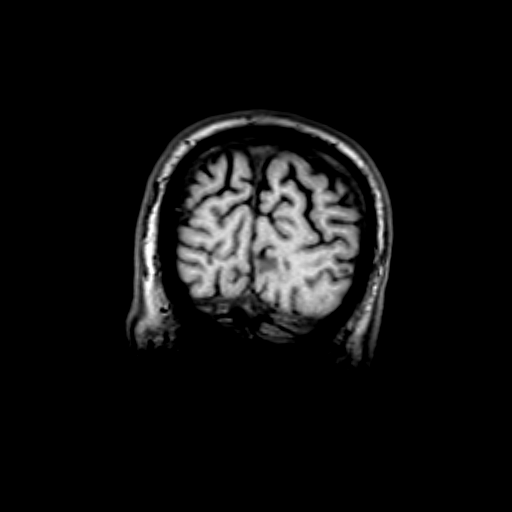
[im 36/107]
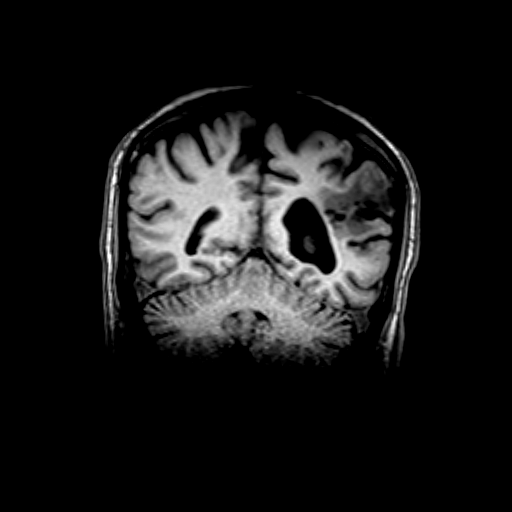
[im 54/107]
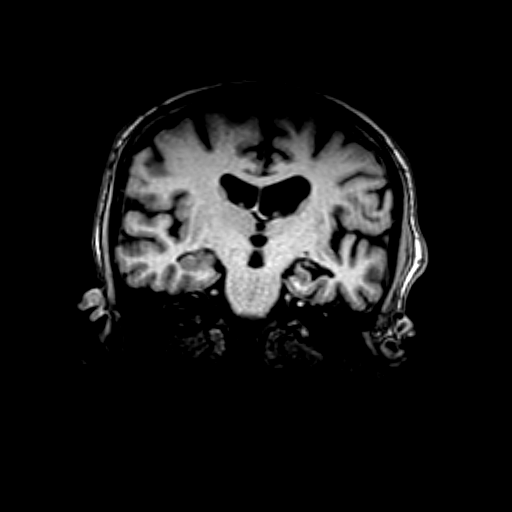

[43 of 48 positions shown; findings below may reference images not displayed]

FINDINGS: Brain: No acute infarct, mass effect or extra-axial collection. Mild
generalized atrophy. Old posterior left MCA territory infarct with
chronic blood products. The midline structures are normal.

Vascular: Major flow voids are preserved.

Skull and upper cervical spine: Normal calvarium and skull base.
Visualized upper cervical spine and soft tissues are normal.

Sinuses/Orbits:No paranasal sinus fluid levels or advanced mucosal
thickening. No mastoid or middle ear effusion. Normal orbits.
IMPRESSION: 1. No acute intracranial abnormality.
2. Old posterior left MCA territory infarct.
